# Patient Record
Sex: Female | Born: 1945
Health system: Southern US, Community
[De-identification: ages and names within clinical notes are randomized; demographics above are authoritative.]

## PROBLEM LIST (undated history)

## (undated) DIAGNOSIS — F0390 Unspecified dementia without behavioral disturbance: Secondary | ICD-10-CM

## (undated) DIAGNOSIS — M1990 Unspecified osteoarthritis, unspecified site: Secondary | ICD-10-CM

## (undated) DIAGNOSIS — I10 Essential (primary) hypertension: Secondary | ICD-10-CM

## (undated) DIAGNOSIS — Z9289 Personal history of other medical treatment: Secondary | ICD-10-CM

## (undated) DIAGNOSIS — D510 Vitamin B12 deficiency anemia due to intrinsic factor deficiency: Secondary | ICD-10-CM

## (undated) DIAGNOSIS — F329 Major depressive disorder, single episode, unspecified: Secondary | ICD-10-CM

## (undated) DIAGNOSIS — R32 Unspecified urinary incontinence: Secondary | ICD-10-CM

## (undated) DIAGNOSIS — E063 Autoimmune thyroiditis: Secondary | ICD-10-CM

## (undated) DIAGNOSIS — K9041 Non-celiac gluten sensitivity: Secondary | ICD-10-CM

## (undated) DIAGNOSIS — F319 Bipolar disorder, unspecified: Secondary | ICD-10-CM

## (undated) DIAGNOSIS — H548 Legal blindness, as defined in USA: Secondary | ICD-10-CM

## (undated) DIAGNOSIS — Z87440 Personal history of urinary (tract) infections: Secondary | ICD-10-CM

## (undated) DIAGNOSIS — E039 Hypothyroidism, unspecified: Secondary | ICD-10-CM

## (undated) DIAGNOSIS — E739 Lactose intolerance, unspecified: Secondary | ICD-10-CM

## (undated) DIAGNOSIS — F32A Depression, unspecified: Secondary | ICD-10-CM

## (undated) DIAGNOSIS — E079 Disorder of thyroid, unspecified: Secondary | ICD-10-CM

## (undated) DIAGNOSIS — B019 Varicella without complication: Secondary | ICD-10-CM

## (undated) DIAGNOSIS — Z96641 Presence of right artificial hip joint: Secondary | ICD-10-CM

## (undated) DIAGNOSIS — I00 Rheumatic fever without heart involvement: Secondary | ICD-10-CM

## (undated) DIAGNOSIS — T7840XA Allergy, unspecified, initial encounter: Secondary | ICD-10-CM

## (undated) DIAGNOSIS — R159 Full incontinence of feces: Secondary | ICD-10-CM

## (undated) DIAGNOSIS — M810 Age-related osteoporosis without current pathological fracture: Secondary | ICD-10-CM

## (undated) HISTORY — DX: Legal blindness, as defined in USA: H54.8

## (undated) HISTORY — DX: Major depressive disorder, single episode, unspecified: F32.9

## (undated) HISTORY — DX: Depression, unspecified: F32.A

## (undated) HISTORY — DX: Personal history of other medical treatment: Z92.89

## (undated) HISTORY — DX: Lactose intolerance, unspecified: E73.9

## (undated) HISTORY — DX: Hypothyroidism, unspecified: E03.9

## (undated) HISTORY — PX: OTHER SURGICAL HISTORY: SHX169

## (undated) HISTORY — DX: Allergy, unspecified, initial encounter: T78.40XA

## (undated) HISTORY — DX: Rheumatic fever without heart involvement: I00

## (undated) HISTORY — DX: Non-celiac gluten sensitivity: K90.41

## (undated) HISTORY — DX: Full incontinence of feces: R15.9

## (undated) HISTORY — DX: Bipolar disorder, unspecified: F31.9

## (undated) HISTORY — DX: Personal history of urinary (tract) infections: Z87.440

## (undated) HISTORY — DX: Unspecified osteoarthritis, unspecified site: M19.90

## (undated) HISTORY — DX: Essential (primary) hypertension: I10

## (undated) HISTORY — DX: Disorder of thyroid, unspecified: E07.9

## (undated) HISTORY — DX: Age-related osteoporosis without current pathological fracture: M81.0

## (undated) HISTORY — PX: TUBAL LIGATION: SHX77

## (undated) HISTORY — DX: Autoimmune thyroiditis: E06.3

## (undated) HISTORY — DX: Unspecified dementia, unspecified severity, without behavioral disturbance, psychotic disturbance, mood disturbance, and anxiety: F03.90

## (undated) HISTORY — DX: Presence of right artificial hip joint: Z96.641

## (undated) HISTORY — DX: Unspecified urinary incontinence: R32

## (undated) HISTORY — DX: Vitamin B12 deficiency anemia due to intrinsic factor deficiency: D51.0

## (undated) HISTORY — PX: ABDOMINAL HYSTERECTOMY: SHX81

## (undated) HISTORY — DX: Varicella without complication: B01.9

---

## 1964-03-20 HISTORY — PX: TONSILLECTOMY AND ADENOIDECTOMY: SUR1326

## 1979-03-21 HISTORY — PX: APPENDECTOMY: SHX54

## 1998-03-20 DIAGNOSIS — F419 Anxiety disorder, unspecified: Secondary | ICD-10-CM

## 1998-03-20 HISTORY — DX: Anxiety disorder, unspecified: F41.9

## 2012-03-20 HISTORY — PX: EYE SURGERY: SHX253

## 2013-03-20 DIAGNOSIS — Z9889 Other specified postprocedural states: Secondary | ICD-10-CM

## 2013-03-20 HISTORY — DX: Other specified postprocedural states: Z98.890

## 2016-03-24 DIAGNOSIS — M47812 Spondylosis without myelopathy or radiculopathy, cervical region: Secondary | ICD-10-CM | POA: Diagnosis not present

## 2016-03-24 DIAGNOSIS — M4802 Spinal stenosis, cervical region: Secondary | ICD-10-CM | POA: Diagnosis not present

## 2016-03-27 DIAGNOSIS — R29898 Other symptoms and signs involving the musculoskeletal system: Secondary | ICD-10-CM | POA: Diagnosis not present

## 2016-03-27 DIAGNOSIS — Z5189 Encounter for other specified aftercare: Secondary | ICD-10-CM | POA: Diagnosis not present

## 2016-03-27 DIAGNOSIS — G5603 Carpal tunnel syndrome, bilateral upper limbs: Secondary | ICD-10-CM | POA: Diagnosis not present

## 2016-04-05 DIAGNOSIS — G5603 Carpal tunnel syndrome, bilateral upper limbs: Secondary | ICD-10-CM | POA: Diagnosis not present

## 2016-04-05 DIAGNOSIS — Z5189 Encounter for other specified aftercare: Secondary | ICD-10-CM | POA: Diagnosis not present

## 2016-04-05 DIAGNOSIS — R29898 Other symptoms and signs involving the musculoskeletal system: Secondary | ICD-10-CM | POA: Diagnosis not present

## 2016-04-10 DIAGNOSIS — Z23 Encounter for immunization: Secondary | ICD-10-CM | POA: Diagnosis not present

## 2016-04-10 DIAGNOSIS — I10 Essential (primary) hypertension: Secondary | ICD-10-CM | POA: Diagnosis not present

## 2016-04-10 DIAGNOSIS — Z79899 Other long term (current) drug therapy: Secondary | ICD-10-CM | POA: Diagnosis not present

## 2016-04-10 DIAGNOSIS — E039 Hypothyroidism, unspecified: Secondary | ICD-10-CM | POA: Diagnosis not present

## 2016-04-10 DIAGNOSIS — D510 Vitamin B12 deficiency anemia due to intrinsic factor deficiency: Secondary | ICD-10-CM | POA: Diagnosis not present

## 2016-04-10 DIAGNOSIS — D509 Iron deficiency anemia, unspecified: Secondary | ICD-10-CM | POA: Diagnosis not present

## 2016-04-10 DIAGNOSIS — F3176 Bipolar disorder, in full remission, most recent episode depressed: Secondary | ICD-10-CM | POA: Diagnosis not present

## 2016-04-10 DIAGNOSIS — H3530 Unspecified macular degeneration: Secondary | ICD-10-CM | POA: Diagnosis not present

## 2016-04-14 DIAGNOSIS — R29898 Other symptoms and signs involving the musculoskeletal system: Secondary | ICD-10-CM | POA: Diagnosis not present

## 2016-04-14 DIAGNOSIS — G5603 Carpal tunnel syndrome, bilateral upper limbs: Secondary | ICD-10-CM | POA: Diagnosis not present

## 2016-04-14 DIAGNOSIS — Z5189 Encounter for other specified aftercare: Secondary | ICD-10-CM | POA: Diagnosis not present

## 2016-04-17 DIAGNOSIS — G5603 Carpal tunnel syndrome, bilateral upper limbs: Secondary | ICD-10-CM | POA: Diagnosis not present

## 2016-04-17 DIAGNOSIS — R29898 Other symptoms and signs involving the musculoskeletal system: Secondary | ICD-10-CM | POA: Diagnosis not present

## 2016-04-17 DIAGNOSIS — Z5189 Encounter for other specified aftercare: Secondary | ICD-10-CM | POA: Diagnosis not present

## 2016-04-28 DIAGNOSIS — J069 Acute upper respiratory infection, unspecified: Secondary | ICD-10-CM | POA: Diagnosis not present

## 2016-05-03 DIAGNOSIS — G609 Hereditary and idiopathic neuropathy, unspecified: Secondary | ICD-10-CM | POA: Diagnosis not present

## 2016-05-03 DIAGNOSIS — H353130 Nonexudative age-related macular degeneration, bilateral, stage unspecified: Secondary | ICD-10-CM | POA: Diagnosis not present

## 2016-05-04 DIAGNOSIS — Z0000 Encounter for general adult medical examination without abnormal findings: Secondary | ICD-10-CM | POA: Diagnosis not present

## 2016-05-04 DIAGNOSIS — E039 Hypothyroidism, unspecified: Secondary | ICD-10-CM | POA: Diagnosis not present

## 2016-05-04 DIAGNOSIS — I10 Essential (primary) hypertension: Secondary | ICD-10-CM | POA: Diagnosis not present

## 2016-05-04 DIAGNOSIS — M791 Myalgia: Secondary | ICD-10-CM | POA: Diagnosis not present

## 2016-05-04 DIAGNOSIS — Z79899 Other long term (current) drug therapy: Secondary | ICD-10-CM | POA: Diagnosis not present

## 2016-05-04 DIAGNOSIS — F3176 Bipolar disorder, in full remission, most recent episode depressed: Secondary | ICD-10-CM | POA: Diagnosis not present

## 2016-05-04 DIAGNOSIS — D509 Iron deficiency anemia, unspecified: Secondary | ICD-10-CM | POA: Diagnosis not present

## 2016-05-04 DIAGNOSIS — H3530 Unspecified macular degeneration: Secondary | ICD-10-CM | POA: Diagnosis not present

## 2016-05-04 DIAGNOSIS — D510 Vitamin B12 deficiency anemia due to intrinsic factor deficiency: Secondary | ICD-10-CM | POA: Diagnosis not present

## 2016-05-08 DIAGNOSIS — N390 Urinary tract infection, site not specified: Secondary | ICD-10-CM | POA: Diagnosis not present

## 2016-07-10 DIAGNOSIS — Z0000 Encounter for general adult medical examination without abnormal findings: Secondary | ICD-10-CM | POA: Diagnosis not present

## 2016-07-10 DIAGNOSIS — E039 Hypothyroidism, unspecified: Secondary | ICD-10-CM | POA: Diagnosis not present

## 2016-07-10 DIAGNOSIS — H3530 Unspecified macular degeneration: Secondary | ICD-10-CM | POA: Diagnosis not present

## 2016-07-10 DIAGNOSIS — F3176 Bipolar disorder, in full remission, most recent episode depressed: Secondary | ICD-10-CM | POA: Diagnosis not present

## 2016-07-10 DIAGNOSIS — D509 Iron deficiency anemia, unspecified: Secondary | ICD-10-CM | POA: Diagnosis not present

## 2016-07-10 DIAGNOSIS — Z79899 Other long term (current) drug therapy: Secondary | ICD-10-CM | POA: Diagnosis not present

## 2016-07-10 DIAGNOSIS — I10 Essential (primary) hypertension: Secondary | ICD-10-CM | POA: Diagnosis not present

## 2016-07-10 DIAGNOSIS — D510 Vitamin B12 deficiency anemia due to intrinsic factor deficiency: Secondary | ICD-10-CM | POA: Diagnosis not present

## 2016-07-31 DIAGNOSIS — R2681 Unsteadiness on feet: Secondary | ICD-10-CM | POA: Diagnosis not present

## 2016-07-31 DIAGNOSIS — Z8669 Personal history of other diseases of the nervous system and sense organs: Secondary | ICD-10-CM | POA: Diagnosis not present

## 2016-07-31 DIAGNOSIS — R2689 Other abnormalities of gait and mobility: Secondary | ICD-10-CM | POA: Diagnosis not present

## 2016-08-17 DIAGNOSIS — M71342 Other bursal cyst, left hand: Secondary | ICD-10-CM | POA: Diagnosis not present

## 2016-08-17 DIAGNOSIS — D1801 Hemangioma of skin and subcutaneous tissue: Secondary | ICD-10-CM | POA: Diagnosis not present

## 2016-10-30 DIAGNOSIS — Z0000 Encounter for general adult medical examination without abnormal findings: Secondary | ICD-10-CM | POA: Diagnosis not present

## 2016-10-30 DIAGNOSIS — D510 Vitamin B12 deficiency anemia due to intrinsic factor deficiency: Secondary | ICD-10-CM | POA: Diagnosis not present

## 2016-10-30 DIAGNOSIS — I10 Essential (primary) hypertension: Secondary | ICD-10-CM | POA: Diagnosis not present

## 2016-10-30 DIAGNOSIS — E039 Hypothyroidism, unspecified: Secondary | ICD-10-CM | POA: Diagnosis not present

## 2016-10-30 DIAGNOSIS — F3176 Bipolar disorder, in full remission, most recent episode depressed: Secondary | ICD-10-CM | POA: Diagnosis not present

## 2016-10-30 DIAGNOSIS — H3530 Unspecified macular degeneration: Secondary | ICD-10-CM | POA: Diagnosis not present

## 2016-10-30 DIAGNOSIS — Z79899 Other long term (current) drug therapy: Secondary | ICD-10-CM | POA: Diagnosis not present

## 2016-10-30 DIAGNOSIS — D509 Iron deficiency anemia, unspecified: Secondary | ICD-10-CM | POA: Diagnosis not present

## 2016-12-08 DIAGNOSIS — Z23 Encounter for immunization: Secondary | ICD-10-CM | POA: Diagnosis not present

## 2016-12-26 DIAGNOSIS — R269 Unspecified abnormalities of gait and mobility: Secondary | ICD-10-CM | POA: Diagnosis not present

## 2016-12-26 DIAGNOSIS — G609 Hereditary and idiopathic neuropathy, unspecified: Secondary | ICD-10-CM | POA: Diagnosis not present

## 2016-12-26 DIAGNOSIS — M4722 Other spondylosis with radiculopathy, cervical region: Secondary | ICD-10-CM | POA: Diagnosis not present

## 2016-12-27 DIAGNOSIS — G609 Hereditary and idiopathic neuropathy, unspecified: Secondary | ICD-10-CM | POA: Diagnosis not present

## 2016-12-27 DIAGNOSIS — R531 Weakness: Secondary | ICD-10-CM | POA: Diagnosis not present

## 2016-12-27 DIAGNOSIS — M4722 Other spondylosis with radiculopathy, cervical region: Secondary | ICD-10-CM | POA: Diagnosis not present

## 2016-12-27 DIAGNOSIS — R269 Unspecified abnormalities of gait and mobility: Secondary | ICD-10-CM | POA: Diagnosis not present

## 2016-12-29 DIAGNOSIS — M4722 Other spondylosis with radiculopathy, cervical region: Secondary | ICD-10-CM | POA: Diagnosis not present

## 2016-12-29 DIAGNOSIS — R269 Unspecified abnormalities of gait and mobility: Secondary | ICD-10-CM | POA: Diagnosis not present

## 2016-12-29 DIAGNOSIS — G609 Hereditary and idiopathic neuropathy, unspecified: Secondary | ICD-10-CM | POA: Diagnosis not present

## 2016-12-29 DIAGNOSIS — R531 Weakness: Secondary | ICD-10-CM | POA: Diagnosis not present

## 2017-01-01 DIAGNOSIS — G609 Hereditary and idiopathic neuropathy, unspecified: Secondary | ICD-10-CM | POA: Diagnosis not present

## 2017-01-01 DIAGNOSIS — M4722 Other spondylosis with radiculopathy, cervical region: Secondary | ICD-10-CM | POA: Diagnosis not present

## 2017-01-01 DIAGNOSIS — R269 Unspecified abnormalities of gait and mobility: Secondary | ICD-10-CM | POA: Diagnosis not present

## 2017-01-01 DIAGNOSIS — R531 Weakness: Secondary | ICD-10-CM | POA: Diagnosis not present

## 2017-01-02 DIAGNOSIS — Z1231 Encounter for screening mammogram for malignant neoplasm of breast: Secondary | ICD-10-CM | POA: Diagnosis not present

## 2017-01-03 DIAGNOSIS — M4722 Other spondylosis with radiculopathy, cervical region: Secondary | ICD-10-CM | POA: Diagnosis not present

## 2017-01-03 DIAGNOSIS — R531 Weakness: Secondary | ICD-10-CM | POA: Diagnosis not present

## 2017-01-03 DIAGNOSIS — G609 Hereditary and idiopathic neuropathy, unspecified: Secondary | ICD-10-CM | POA: Diagnosis not present

## 2017-01-03 DIAGNOSIS — R269 Unspecified abnormalities of gait and mobility: Secondary | ICD-10-CM | POA: Diagnosis not present

## 2017-01-05 DIAGNOSIS — R269 Unspecified abnormalities of gait and mobility: Secondary | ICD-10-CM | POA: Diagnosis not present

## 2017-01-05 DIAGNOSIS — R531 Weakness: Secondary | ICD-10-CM | POA: Diagnosis not present

## 2017-01-05 DIAGNOSIS — G609 Hereditary and idiopathic neuropathy, unspecified: Secondary | ICD-10-CM | POA: Diagnosis not present

## 2017-01-05 DIAGNOSIS — M4722 Other spondylosis with radiculopathy, cervical region: Secondary | ICD-10-CM | POA: Diagnosis not present

## 2017-01-08 DIAGNOSIS — M4722 Other spondylosis with radiculopathy, cervical region: Secondary | ICD-10-CM | POA: Diagnosis not present

## 2017-01-08 DIAGNOSIS — R531 Weakness: Secondary | ICD-10-CM | POA: Diagnosis not present

## 2017-01-08 DIAGNOSIS — G609 Hereditary and idiopathic neuropathy, unspecified: Secondary | ICD-10-CM | POA: Diagnosis not present

## 2017-01-08 DIAGNOSIS — R269 Unspecified abnormalities of gait and mobility: Secondary | ICD-10-CM | POA: Diagnosis not present

## 2017-01-10 DIAGNOSIS — R269 Unspecified abnormalities of gait and mobility: Secondary | ICD-10-CM | POA: Diagnosis not present

## 2017-01-10 DIAGNOSIS — R531 Weakness: Secondary | ICD-10-CM | POA: Diagnosis not present

## 2017-01-10 DIAGNOSIS — G609 Hereditary and idiopathic neuropathy, unspecified: Secondary | ICD-10-CM | POA: Diagnosis not present

## 2017-01-10 DIAGNOSIS — M4722 Other spondylosis with radiculopathy, cervical region: Secondary | ICD-10-CM | POA: Diagnosis not present

## 2017-01-12 DIAGNOSIS — G609 Hereditary and idiopathic neuropathy, unspecified: Secondary | ICD-10-CM | POA: Diagnosis not present

## 2017-01-12 DIAGNOSIS — M4722 Other spondylosis with radiculopathy, cervical region: Secondary | ICD-10-CM | POA: Diagnosis not present

## 2017-01-12 DIAGNOSIS — R531 Weakness: Secondary | ICD-10-CM | POA: Diagnosis not present

## 2017-01-12 DIAGNOSIS — R269 Unspecified abnormalities of gait and mobility: Secondary | ICD-10-CM | POA: Diagnosis not present

## 2017-01-26 DIAGNOSIS — H04123 Dry eye syndrome of bilateral lacrimal glands: Secondary | ICD-10-CM | POA: Diagnosis not present

## 2017-01-26 DIAGNOSIS — H353132 Nonexudative age-related macular degeneration, bilateral, intermediate dry stage: Secondary | ICD-10-CM | POA: Diagnosis not present

## 2017-01-26 DIAGNOSIS — H02833 Dermatochalasis of right eye, unspecified eyelid: Secondary | ICD-10-CM | POA: Diagnosis not present

## 2017-01-26 DIAGNOSIS — H43813 Vitreous degeneration, bilateral: Secondary | ICD-10-CM | POA: Diagnosis not present

## 2017-01-26 DIAGNOSIS — H35372 Puckering of macula, left eye: Secondary | ICD-10-CM | POA: Diagnosis not present

## 2017-01-26 DIAGNOSIS — H02836 Dermatochalasis of left eye, unspecified eyelid: Secondary | ICD-10-CM | POA: Diagnosis not present

## 2017-01-26 DIAGNOSIS — H35371 Puckering of macula, right eye: Secondary | ICD-10-CM | POA: Diagnosis not present

## 2017-01-29 DIAGNOSIS — R531 Weakness: Secondary | ICD-10-CM | POA: Diagnosis not present

## 2017-01-29 DIAGNOSIS — G609 Hereditary and idiopathic neuropathy, unspecified: Secondary | ICD-10-CM | POA: Diagnosis not present

## 2017-01-29 DIAGNOSIS — M4722 Other spondylosis with radiculopathy, cervical region: Secondary | ICD-10-CM | POA: Diagnosis not present

## 2017-01-29 DIAGNOSIS — R269 Unspecified abnormalities of gait and mobility: Secondary | ICD-10-CM | POA: Diagnosis not present

## 2017-01-30 DIAGNOSIS — H527 Unspecified disorder of refraction: Secondary | ICD-10-CM | POA: Diagnosis not present

## 2017-01-30 DIAGNOSIS — H35372 Puckering of macula, left eye: Secondary | ICD-10-CM | POA: Diagnosis not present

## 2017-01-30 DIAGNOSIS — H353122 Nonexudative age-related macular degeneration, left eye, intermediate dry stage: Secondary | ICD-10-CM | POA: Diagnosis not present

## 2017-01-30 DIAGNOSIS — H353112 Nonexudative age-related macular degeneration, right eye, intermediate dry stage: Secondary | ICD-10-CM | POA: Diagnosis not present

## 2017-01-30 DIAGNOSIS — H40013 Open angle with borderline findings, low risk, bilateral: Secondary | ICD-10-CM | POA: Diagnosis not present

## 2017-01-31 DIAGNOSIS — G609 Hereditary and idiopathic neuropathy, unspecified: Secondary | ICD-10-CM | POA: Diagnosis not present

## 2017-01-31 DIAGNOSIS — M4722 Other spondylosis with radiculopathy, cervical region: Secondary | ICD-10-CM | POA: Diagnosis not present

## 2017-01-31 DIAGNOSIS — R531 Weakness: Secondary | ICD-10-CM | POA: Diagnosis not present

## 2017-01-31 DIAGNOSIS — R269 Unspecified abnormalities of gait and mobility: Secondary | ICD-10-CM | POA: Diagnosis not present

## 2017-02-02 DIAGNOSIS — R269 Unspecified abnormalities of gait and mobility: Secondary | ICD-10-CM | POA: Diagnosis not present

## 2017-02-02 DIAGNOSIS — G609 Hereditary and idiopathic neuropathy, unspecified: Secondary | ICD-10-CM | POA: Diagnosis not present

## 2017-02-02 DIAGNOSIS — R531 Weakness: Secondary | ICD-10-CM | POA: Diagnosis not present

## 2017-02-02 DIAGNOSIS — M4722 Other spondylosis with radiculopathy, cervical region: Secondary | ICD-10-CM | POA: Diagnosis not present

## 2017-02-05 DIAGNOSIS — I10 Essential (primary) hypertension: Secondary | ICD-10-CM | POA: Diagnosis not present

## 2017-02-05 DIAGNOSIS — D510 Vitamin B12 deficiency anemia due to intrinsic factor deficiency: Secondary | ICD-10-CM | POA: Diagnosis not present

## 2017-02-05 DIAGNOSIS — H3530 Unspecified macular degeneration: Secondary | ICD-10-CM | POA: Diagnosis not present

## 2017-02-05 DIAGNOSIS — F3176 Bipolar disorder, in full remission, most recent episode depressed: Secondary | ICD-10-CM | POA: Diagnosis not present

## 2017-02-05 DIAGNOSIS — Z0000 Encounter for general adult medical examination without abnormal findings: Secondary | ICD-10-CM | POA: Diagnosis not present

## 2017-02-05 DIAGNOSIS — D509 Iron deficiency anemia, unspecified: Secondary | ICD-10-CM | POA: Diagnosis not present

## 2017-02-05 DIAGNOSIS — E039 Hypothyroidism, unspecified: Secondary | ICD-10-CM | POA: Diagnosis not present

## 2017-02-05 DIAGNOSIS — Z79899 Other long term (current) drug therapy: Secondary | ICD-10-CM | POA: Diagnosis not present

## 2017-02-15 DIAGNOSIS — R531 Weakness: Secondary | ICD-10-CM | POA: Diagnosis not present

## 2017-02-15 DIAGNOSIS — M4722 Other spondylosis with radiculopathy, cervical region: Secondary | ICD-10-CM | POA: Diagnosis not present

## 2017-02-15 DIAGNOSIS — G609 Hereditary and idiopathic neuropathy, unspecified: Secondary | ICD-10-CM | POA: Diagnosis not present

## 2017-02-15 DIAGNOSIS — R269 Unspecified abnormalities of gait and mobility: Secondary | ICD-10-CM | POA: Diagnosis not present

## 2017-02-19 DIAGNOSIS — M4722 Other spondylosis with radiculopathy, cervical region: Secondary | ICD-10-CM | POA: Diagnosis not present

## 2017-02-19 DIAGNOSIS — R269 Unspecified abnormalities of gait and mobility: Secondary | ICD-10-CM | POA: Diagnosis not present

## 2017-02-19 DIAGNOSIS — G609 Hereditary and idiopathic neuropathy, unspecified: Secondary | ICD-10-CM | POA: Diagnosis not present

## 2017-02-19 DIAGNOSIS — R531 Weakness: Secondary | ICD-10-CM | POA: Diagnosis not present

## 2017-02-20 DIAGNOSIS — G609 Hereditary and idiopathic neuropathy, unspecified: Secondary | ICD-10-CM | POA: Diagnosis not present

## 2017-02-20 DIAGNOSIS — R531 Weakness: Secondary | ICD-10-CM | POA: Diagnosis not present

## 2017-02-20 DIAGNOSIS — R269 Unspecified abnormalities of gait and mobility: Secondary | ICD-10-CM | POA: Diagnosis not present

## 2017-02-20 DIAGNOSIS — M4722 Other spondylosis with radiculopathy, cervical region: Secondary | ICD-10-CM | POA: Diagnosis not present

## 2017-03-15 DIAGNOSIS — X32XXXA Exposure to sunlight, initial encounter: Secondary | ICD-10-CM | POA: Diagnosis not present

## 2017-03-15 DIAGNOSIS — L814 Other melanin hyperpigmentation: Secondary | ICD-10-CM | POA: Diagnosis not present

## 2017-03-15 DIAGNOSIS — D485 Neoplasm of uncertain behavior of skin: Secondary | ICD-10-CM | POA: Diagnosis not present

## 2017-03-15 DIAGNOSIS — D1801 Hemangioma of skin and subcutaneous tissue: Secondary | ICD-10-CM | POA: Diagnosis not present

## 2017-03-15 DIAGNOSIS — M71342 Other bursal cyst, left hand: Secondary | ICD-10-CM | POA: Diagnosis not present

## 2017-03-15 DIAGNOSIS — L720 Epidermal cyst: Secondary | ICD-10-CM | POA: Diagnosis not present

## 2017-03-15 DIAGNOSIS — L570 Actinic keratosis: Secondary | ICD-10-CM | POA: Diagnosis not present

## 2017-03-15 DIAGNOSIS — L218 Other seborrheic dermatitis: Secondary | ICD-10-CM | POA: Diagnosis not present

## 2017-03-15 DIAGNOSIS — L821 Other seborrheic keratosis: Secondary | ICD-10-CM | POA: Diagnosis not present

## 2017-03-20 DIAGNOSIS — Z9289 Personal history of other medical treatment: Secondary | ICD-10-CM

## 2017-03-20 HISTORY — DX: Personal history of other medical treatment: Z92.89

## 2017-03-22 DIAGNOSIS — L720 Epidermal cyst: Secondary | ICD-10-CM | POA: Diagnosis not present

## 2017-03-28 DIAGNOSIS — H40013 Open angle with borderline findings, low risk, bilateral: Secondary | ICD-10-CM | POA: Diagnosis not present

## 2017-04-29 DIAGNOSIS — R6883 Chills (without fever): Secondary | ICD-10-CM | POA: Diagnosis not present

## 2017-04-29 DIAGNOSIS — J209 Acute bronchitis, unspecified: Secondary | ICD-10-CM | POA: Diagnosis not present

## 2017-04-29 DIAGNOSIS — J069 Acute upper respiratory infection, unspecified: Secondary | ICD-10-CM | POA: Diagnosis not present

## 2017-04-29 DIAGNOSIS — R05 Cough: Secondary | ICD-10-CM | POA: Diagnosis not present

## 2017-04-29 DIAGNOSIS — R0602 Shortness of breath: Secondary | ICD-10-CM | POA: Diagnosis not present

## 2017-05-09 DIAGNOSIS — Z79899 Other long term (current) drug therapy: Secondary | ICD-10-CM | POA: Diagnosis not present

## 2017-05-09 DIAGNOSIS — I10 Essential (primary) hypertension: Secondary | ICD-10-CM | POA: Diagnosis not present

## 2017-05-09 DIAGNOSIS — F3176 Bipolar disorder, in full remission, most recent episode depressed: Secondary | ICD-10-CM | POA: Diagnosis not present

## 2017-05-09 DIAGNOSIS — E039 Hypothyroidism, unspecified: Secondary | ICD-10-CM | POA: Diagnosis not present

## 2017-05-09 DIAGNOSIS — Z0000 Encounter for general adult medical examination without abnormal findings: Secondary | ICD-10-CM | POA: Diagnosis not present

## 2017-05-09 DIAGNOSIS — D509 Iron deficiency anemia, unspecified: Secondary | ICD-10-CM | POA: Diagnosis not present

## 2017-05-09 DIAGNOSIS — D510 Vitamin B12 deficiency anemia due to intrinsic factor deficiency: Secondary | ICD-10-CM | POA: Diagnosis not present

## 2017-05-09 DIAGNOSIS — H3530 Unspecified macular degeneration: Secondary | ICD-10-CM | POA: Diagnosis not present

## 2017-05-23 DIAGNOSIS — E039 Hypothyroidism, unspecified: Secondary | ICD-10-CM | POA: Diagnosis not present

## 2017-08-07 DIAGNOSIS — Z79899 Other long term (current) drug therapy: Secondary | ICD-10-CM | POA: Diagnosis not present

## 2017-08-07 DIAGNOSIS — I10 Essential (primary) hypertension: Secondary | ICD-10-CM | POA: Diagnosis not present

## 2017-08-07 DIAGNOSIS — E039 Hypothyroidism, unspecified: Secondary | ICD-10-CM | POA: Diagnosis not present

## 2017-08-07 DIAGNOSIS — D510 Vitamin B12 deficiency anemia due to intrinsic factor deficiency: Secondary | ICD-10-CM | POA: Diagnosis not present

## 2017-08-07 DIAGNOSIS — F3176 Bipolar disorder, in full remission, most recent episode depressed: Secondary | ICD-10-CM | POA: Diagnosis not present

## 2017-08-24 DIAGNOSIS — H353130 Nonexudative age-related macular degeneration, bilateral, stage unspecified: Secondary | ICD-10-CM | POA: Diagnosis not present

## 2017-11-07 DIAGNOSIS — D510 Vitamin B12 deficiency anemia due to intrinsic factor deficiency: Secondary | ICD-10-CM | POA: Diagnosis not present

## 2017-11-07 DIAGNOSIS — E039 Hypothyroidism, unspecified: Secondary | ICD-10-CM | POA: Diagnosis not present

## 2017-11-07 DIAGNOSIS — F3176 Bipolar disorder, in full remission, most recent episode depressed: Secondary | ICD-10-CM | POA: Diagnosis not present

## 2017-11-07 DIAGNOSIS — I10 Essential (primary) hypertension: Secondary | ICD-10-CM | POA: Diagnosis not present

## 2017-11-15 DIAGNOSIS — R5081 Fever presenting with conditions classified elsewhere: Secondary | ICD-10-CM | POA: Diagnosis not present

## 2017-11-15 DIAGNOSIS — J069 Acute upper respiratory infection, unspecified: Secondary | ICD-10-CM | POA: Diagnosis not present

## 2017-11-15 DIAGNOSIS — R0602 Shortness of breath: Secondary | ICD-10-CM | POA: Diagnosis not present

## 2017-11-15 DIAGNOSIS — J0100 Acute maxillary sinusitis, unspecified: Secondary | ICD-10-CM | POA: Diagnosis not present

## 2017-12-07 DIAGNOSIS — Z885 Allergy status to narcotic agent status: Secondary | ICD-10-CM | POA: Diagnosis not present

## 2017-12-07 DIAGNOSIS — R930 Abnormal findings on diagnostic imaging of skull and head, not elsewhere classified: Secondary | ICD-10-CM | POA: Diagnosis not present

## 2017-12-07 DIAGNOSIS — Z880 Allergy status to penicillin: Secondary | ICD-10-CM | POA: Diagnosis not present

## 2017-12-07 DIAGNOSIS — I6601 Occlusion and stenosis of right middle cerebral artery: Secondary | ICD-10-CM | POA: Diagnosis not present

## 2017-12-07 DIAGNOSIS — I6523 Occlusion and stenosis of bilateral carotid arteries: Secondary | ICD-10-CM | POA: Diagnosis not present

## 2017-12-07 DIAGNOSIS — I10 Essential (primary) hypertension: Secondary | ICD-10-CM | POA: Diagnosis not present

## 2017-12-07 DIAGNOSIS — H538 Other visual disturbances: Secondary | ICD-10-CM | POA: Diagnosis not present

## 2017-12-07 DIAGNOSIS — I671 Cerebral aneurysm, nonruptured: Secondary | ICD-10-CM | POA: Diagnosis not present

## 2017-12-07 DIAGNOSIS — H539 Unspecified visual disturbance: Secondary | ICD-10-CM | POA: Diagnosis not present

## 2017-12-07 DIAGNOSIS — E063 Autoimmune thyroiditis: Secondary | ICD-10-CM | POA: Diagnosis not present

## 2017-12-07 DIAGNOSIS — Z9889 Other specified postprocedural states: Secondary | ICD-10-CM | POA: Diagnosis not present

## 2017-12-08 DIAGNOSIS — H538 Other visual disturbances: Secondary | ICD-10-CM | POA: Diagnosis not present

## 2017-12-08 DIAGNOSIS — G459 Transient cerebral ischemic attack, unspecified: Secondary | ICD-10-CM | POA: Diagnosis not present

## 2017-12-08 DIAGNOSIS — D510 Vitamin B12 deficiency anemia due to intrinsic factor deficiency: Secondary | ICD-10-CM | POA: Diagnosis not present

## 2017-12-08 DIAGNOSIS — I6601 Occlusion and stenosis of right middle cerebral artery: Secondary | ICD-10-CM | POA: Diagnosis not present

## 2017-12-08 DIAGNOSIS — Z880 Allergy status to penicillin: Secondary | ICD-10-CM | POA: Diagnosis not present

## 2017-12-08 DIAGNOSIS — H539 Unspecified visual disturbance: Secondary | ICD-10-CM | POA: Diagnosis not present

## 2017-12-08 DIAGNOSIS — I671 Cerebral aneurysm, nonruptured: Secondary | ICD-10-CM | POA: Diagnosis not present

## 2017-12-08 DIAGNOSIS — Z79899 Other long term (current) drug therapy: Secondary | ICD-10-CM | POA: Diagnosis not present

## 2017-12-08 DIAGNOSIS — I6523 Occlusion and stenosis of bilateral carotid arteries: Secondary | ICD-10-CM | POA: Diagnosis not present

## 2017-12-08 DIAGNOSIS — Z885 Allergy status to narcotic agent status: Secondary | ICD-10-CM | POA: Diagnosis not present

## 2017-12-08 DIAGNOSIS — I10 Essential (primary) hypertension: Secondary | ICD-10-CM | POA: Diagnosis not present

## 2017-12-08 DIAGNOSIS — Z743 Need for continuous supervision: Secondary | ICD-10-CM | POA: Diagnosis not present

## 2017-12-08 DIAGNOSIS — E063 Autoimmune thyroiditis: Secondary | ICD-10-CM | POA: Diagnosis not present

## 2017-12-11 DIAGNOSIS — H35372 Puckering of macula, left eye: Secondary | ICD-10-CM | POA: Diagnosis not present

## 2017-12-11 DIAGNOSIS — H43813 Vitreous degeneration, bilateral: Secondary | ICD-10-CM | POA: Diagnosis not present

## 2017-12-11 DIAGNOSIS — H35371 Puckering of macula, right eye: Secondary | ICD-10-CM | POA: Diagnosis not present

## 2017-12-11 DIAGNOSIS — H353132 Nonexudative age-related macular degeneration, bilateral, intermediate dry stage: Secondary | ICD-10-CM | POA: Diagnosis not present

## 2017-12-11 DIAGNOSIS — H53122 Transient visual loss, left eye: Secondary | ICD-10-CM | POA: Diagnosis not present

## 2017-12-13 DIAGNOSIS — H353132 Nonexudative age-related macular degeneration, bilateral, intermediate dry stage: Secondary | ICD-10-CM | POA: Diagnosis not present

## 2017-12-18 DIAGNOSIS — G609 Hereditary and idiopathic neuropathy, unspecified: Secondary | ICD-10-CM | POA: Diagnosis not present

## 2018-01-02 DIAGNOSIS — R9089 Other abnormal findings on diagnostic imaging of central nervous system: Secondary | ICD-10-CM | POA: Diagnosis not present

## 2018-01-02 DIAGNOSIS — M4802 Spinal stenosis, cervical region: Secondary | ICD-10-CM | POA: Diagnosis not present

## 2018-01-18 DIAGNOSIS — M4313 Spondylolisthesis, cervicothoracic region: Secondary | ICD-10-CM | POA: Diagnosis not present

## 2018-01-18 DIAGNOSIS — M4802 Spinal stenosis, cervical region: Secondary | ICD-10-CM | POA: Diagnosis not present

## 2018-01-18 DIAGNOSIS — M47812 Spondylosis without myelopathy or radiculopathy, cervical region: Secondary | ICD-10-CM | POA: Diagnosis not present

## 2018-01-18 DIAGNOSIS — I671 Cerebral aneurysm, nonruptured: Secondary | ICD-10-CM | POA: Diagnosis not present

## 2018-01-18 DIAGNOSIS — M4312 Spondylolisthesis, cervical region: Secondary | ICD-10-CM | POA: Diagnosis not present

## 2018-01-19 DIAGNOSIS — Z23 Encounter for immunization: Secondary | ICD-10-CM | POA: Diagnosis not present

## 2018-03-01 ENCOUNTER — Ambulatory Visit (INDEPENDENT_AMBULATORY_CARE_PROVIDER_SITE_OTHER): Payer: Medicare Other | Admitting: Internal Medicine

## 2018-03-01 ENCOUNTER — Encounter: Payer: Self-pay | Admitting: Internal Medicine

## 2018-03-01 VITALS — BP 110/70 | HR 100 | Temp 98.4°F | Ht 65.0 in | Wt 134.0 lb

## 2018-03-01 DIAGNOSIS — M810 Age-related osteoporosis without current pathological fracture: Secondary | ICD-10-CM | POA: Diagnosis not present

## 2018-03-01 DIAGNOSIS — D510 Vitamin B12 deficiency anemia due to intrinsic factor deficiency: Secondary | ICD-10-CM

## 2018-03-01 DIAGNOSIS — F319 Bipolar disorder, unspecified: Secondary | ICD-10-CM

## 2018-03-01 DIAGNOSIS — E039 Hypothyroidism, unspecified: Secondary | ICD-10-CM

## 2018-03-01 DIAGNOSIS — I10 Essential (primary) hypertension: Secondary | ICD-10-CM | POA: Diagnosis not present

## 2018-03-01 DIAGNOSIS — H3530 Unspecified macular degeneration: Secondary | ICD-10-CM | POA: Insufficient documentation

## 2018-03-01 MED ORDER — AMLODIPINE BESYLATE 10 MG PO TABS: 10.0000 mg | ORAL_TABLET | Freq: Every day | ORAL | 1 refills | Status: DC

## 2018-03-01 MED ORDER — OLMESARTAN MEDOXOMIL 20 MG PO TABS: 20.0000 mg | ORAL_TABLET | Freq: Every day | ORAL | 1 refills | Status: DC

## 2018-03-01 MED ORDER — VENLAFAXINE HCL ER 225 MG PO TB24: 225.0000 mg | ORAL_TABLET | Freq: Every day | ORAL | 0 refills | Status: DC

## 2018-03-01 MED ORDER — CYANOCOBALAMIN 1000 MCG/ML IJ SOLN: 1000.0000 ug | INTRAMUSCULAR | 0 refills | Status: DC

## 2018-03-01 MED ORDER — DIVALPROEX SODIUM ER 500 MG PO TB24: 500.0000 mg | ORAL_TABLET | Freq: Every day | ORAL | 0 refills | Status: DC

## 2018-03-01 MED ORDER — VORTIOXETINE HBR 20 MG PO TABS: 20.0000 mg | ORAL_TABLET | Freq: Every day | ORAL | 0 refills | Status: DC

## 2018-03-01 MED ORDER — ALENDRONATE SODIUM 70 MG PO TABS: 70.0000 mg | ORAL_TABLET | ORAL | 0 refills | Status: DC

## 2018-03-01 MED ORDER — LEVOTHYROXINE SODIUM 100 MCG PO TABS: 100.0000 ug | ORAL_TABLET | Freq: Every day | ORAL | 1 refills | Status: DC

## 2018-03-01 NOTE — Patient Instructions (Signed)
-  It was nice meeting you today!  -We have sent referrals to psychiatry and ophthalmology at your request.  -Please schedule follow up in 6 months.

## 2018-03-01 NOTE — Progress Notes (Signed)
New Patient Office Visit     CC/Reason for Visit: Establish care, follow-up on chronic medical conditions Previous PCP: Stefanie Libel, MD in [place] Visit: August 2019  HPI: Jackie Jones is a 72 y.o. female who is coming in today for the above mentioned reasons.  She is not due for annual Medicare wellness visit until August 2020.  Past Medical History is significant for: Bipolar disorder, depressed type, osteoporosis, hypertension, hypothyroidism, pernicious anemia, macular degeneration.  Her mood is depressed, she states she had to leave [place] semi-urgently when a friend who was taking care of her no longer was willing to assume responsibility for her.  She came to [facility] to be close to her sister and niece who live locally.  She has no acute complaints other than she is running out of her medications and needs refills.  Is requesting referral to psychiatry.  She brings in recent blood work from May 2019 as well as her list of medications.   Past Medical/Surgical History: Past Medical History:  Diagnosis Date  . Allergy   . Arthritis   . Bipolar 1 disorder, depressed ([place])   . Chicken pox   . Depression   . History of blood transfusion   . Hx: UTI (urinary tract infection)   . Hypertension   . Hypothyroidism   . Osteoporosis   . Pernicious anemia   . Rheumatic fever   . Thyroid disease     Past Surgical History:  Procedure Laterality Date  . ABDOMINAL HYSTERECTOMY     1981  . APPENDECTOMY  1981  . prolapsed rectum 2015    . TONSILLECTOMY AND ADENOIDECTOMY  1966    Social History:  reports that she has never smoked. She has never used smokeless tobacco. She reports that she does not drink alcohol. No history on file for drug.  Allergies: Allergies  Allergen Reactions  . Ciprofloxacin Rash  . Codeine Rash  . Levaquin [Levofloxacin] Rash  . Penicillins Rash    Family History:  Family History  Problem Relation Age of Onset  . Heart  disease Mother   . Arthritis Mother   . Cancer Father        bladder  . Thyroid disease Father   . Dementia Father   . Hypertension Sister   . Hypertension Brother   . Hypertension Sister   . Hypertension Sister   . Diabetes Brother      Current Outpatient Medications:  .  acetaminophen (TYLENOL) 500 MG tablet, Take 500 mg by mouth every 6 (six) hours as needed., Disp: , Rfl:  .  alendronate (FOSAMAX) 70 MG tablet, Take 1 tablet (70 mg total) by mouth once a week. Take with a full glass of water on an empty stomach., Disp: 12 tablet, Rfl: 0 .  amLODipine (NORVASC) 10 MG tablet, Take 1 tablet (10 mg total) by mouth daily., Disp: 90 tablet, Rfl: 1 .  cyanocobalamin (,VITAMIN B-12,) 1000 MCG/ML injection, Inject 1 mL (1,000 mcg total) into the muscle every 30 (thirty) days., Disp: 10 mL, Rfl: 0 .  divalproex (DEPAKOTE ER) 500 MG 24 hr tablet, Take 1 tablet (500 mg total) by mouth at bedtime., Disp: 30 tablet, Rfl: 0 .  levothyroxine (SYNTHROID, LEVOTHROID) 100 MCG tablet, Take 1 tablet (100 mcg total) by mouth daily before breakfast., Disp: 90 tablet, Rfl: 1 .  Multiple Vitamins-Minerals (PRESERVISION AREDS) CAPS, Take by mouth., Disp: , Rfl:  .  olmesartan (BENICAR) 20 MG tablet, Take 1 tablet (  20 mg total) by mouth daily., Disp: 90 tablet, Rfl: 1 .  OVER THE COUNTER MEDICATION, AllerClear 10 mg, Disp: , Rfl:  .  Polyethyl Glycol-Propyl Glycol (SYSTANE) 0.4-0.3 % SOLN, Apply to eye., Disp: , Rfl:  .  Venlafaxine HCl 225 MG TB24, Take 1 tablet (225 mg total) by mouth at bedtime., Disp: 30 tablet, Rfl: 0 .  vortioxetine HBr (TRINTELLIX) 20 MG TABS tablet, Take 1 tablet (20 mg total) by mouth daily., Disp: 30 tablet, Rfl: 0  Review of Systems:  Constitutional: Denies fever, chills, diaphoresis, appetite change and fatigue.  HEENT: Denies photophobia, eye pain, redness, hearing loss, ear pain, congestion, sore throat, rhinorrhea, sneezing, mouth sores, trouble swallowing, neck pain, neck  stiffness and tinnitus.   Respiratory: Denies SOB, DOE, cough, chest tightness,  and wheezing.   Cardiovascular: Denies chest pain, palpitations and leg swelling.  Gastrointestinal: Denies nausea, vomiting, abdominal pain, diarrhea, constipation, blood in stool and abdominal distention.  Genitourinary: Denies dysuria, urgency, frequency, hematuria, flank pain and difficulty urinating.  Endocrine: Denies: hot or cold intolerance, sweats, changes in hair or nails, polyuria, polydipsia. Musculoskeletal: Denies myalgias, back pain, joint swelling, arthralgias and gait problem.  Skin: Denies pallor, rash and wound.  Neurological: Denies dizziness, seizures, syncope, weakness, light-headedness, numbness and headaches.  Hematological: Denies adenopathy. Easy bruising, personal or family bleeding history  Psychiatric/Behavioral: Denies suicidal ideation, but admits to depressed mood, nervousness, sleep disturbance and agitation.      Physical Exam: Vitals:   03/01/18 1115  BP: 110/70  Pulse: 100  Temp: 98.4 F (36.9 C)  TempSrc: Oral  SpO2: 97%  Weight: 134 lb (60.8 kg)  Height: 5\' 5"  (1.651 m)   Body mass index is 22.3 kg/m.  Constitutional: NAD, calm, comfortable Eyes: PERRL, lids and conjunctivae normal, wears corrective lenses ENMT: Mucous membranes are moist.  Neck: normal, supple, no masses, no thyromegaly Respiratory: clear to auscultation bilaterally, no wheezing, no crackles. Normal respiratory effort. No accessory muscle use.  Cardiovascular: Regular rate and rhythm, no murmurs / rubs / gallops. No extremity edema. 2+ pedal pulses. No carotid bruits.  Musculoskeletal: no clubbing / cyanosis. No joint deformity upper and lower extremities. Good ROM, no contractures. Normal muscle tone.  Skin: no rashes, lesions, ulcers. No induration Neurologic: Grossly intact and nonfocal Psychiatric: Normal judgment and insight. Alert and oriented x 3.  Mood is depressed   Impression and  Plan:  Essential hypertension -Blood pressures well controlled, will refill medications including amlodipine 10 mg daily, olmesartan 20 mg daily.  Bipolar 1 disorder, depressed ([place])  -Have agreed to refill her psychotropic medications today while she establishes care with psychiatry locally. -She is on divalproex 500 mg at bedtime, venlafaxine 225 mg daily, Brintellix 20 mg daily. -Urgent referral to neurology has been placed.  Hypothyroidism, unspecified type -We will refill her Synthroid, she brings in blood work from [place] done in May 2019 with a normal TSH of 2.86.  Pernicious anemia -She does monthly B12 injections, will refill today.  Age-related osteoporosis without current pathological fracture  -Is on Fosamax daily, will refill.  Macular degeneration, unspecified laterality, unspecified type - Plan: Ambulatory referral to Ophthalmology -Per patient she had eye exam in [place] earlier this year although I do not have the report.     Patient Instructions  -It was nice meeting you today!  -We have sent referrals to psychiatry and ophthalmology at your request.  -Please schedule follow up in 6 months.     [name], MD [place]  [facility]

## 2018-03-03 ENCOUNTER — Other Ambulatory Visit: Payer: Self-pay | Admitting: Internal Medicine

## 2018-03-03 DIAGNOSIS — F319 Bipolar disorder, unspecified: Secondary | ICD-10-CM

## 2018-03-05 NOTE — Telephone Encounter (Signed)
This was refilled on 12/13. She was asked to follow with psych for further treatment.

## 2018-03-22 ENCOUNTER — Telehealth: Payer: Self-pay | Admitting: Internal Medicine

## 2018-03-22 NOTE — Telephone Encounter (Signed)
Copied from [place] [id number]. Topic: Quick Communication - See Telephone Encounter >> Mar 22, 2018  9:12 AM Blase Mess A wrote: CRM for notification. See Telephone encounter for: 03/22/18.  Patient is requesting a call back.  She has not heard anything on the referral from psychiatry    The patient is also having an eating disorder. She has lost 10lb. Requesting a referral for some assistance on where to go. Please advise [phone]

## 2018-03-22 NOTE — Telephone Encounter (Signed)
Called the patient and gave her the number for [facility].

## 2018-04-01 ENCOUNTER — Ambulatory Visit (INDEPENDENT_AMBULATORY_CARE_PROVIDER_SITE_OTHER): Payer: Medicare Other | Admitting: Psychology

## 2018-04-01 DIAGNOSIS — F332 Major depressive disorder, recurrent severe without psychotic features: Secondary | ICD-10-CM

## 2018-04-11 ENCOUNTER — Ambulatory Visit (INDEPENDENT_AMBULATORY_CARE_PROVIDER_SITE_OTHER): Payer: Medicare Other | Admitting: Psychology

## 2018-04-11 DIAGNOSIS — F332 Major depressive disorder, recurrent severe without psychotic features: Secondary | ICD-10-CM

## 2018-05-03 ENCOUNTER — Ambulatory Visit (INDEPENDENT_AMBULATORY_CARE_PROVIDER_SITE_OTHER): Payer: Medicare Other | Admitting: Psychology

## 2018-05-03 DIAGNOSIS — F332 Major depressive disorder, recurrent severe without psychotic features: Secondary | ICD-10-CM | POA: Diagnosis not present

## 2018-05-17 ENCOUNTER — Ambulatory Visit (INDEPENDENT_AMBULATORY_CARE_PROVIDER_SITE_OTHER): Payer: Medicare Other | Admitting: Psychology

## 2018-05-17 DIAGNOSIS — F332 Major depressive disorder, recurrent severe without psychotic features: Secondary | ICD-10-CM

## 2018-05-20 ENCOUNTER — Ambulatory Visit (INDEPENDENT_AMBULATORY_CARE_PROVIDER_SITE_OTHER): Payer: Medicare Other | Admitting: Psychology

## 2018-05-20 DIAGNOSIS — F332 Major depressive disorder, recurrent severe without psychotic features: Secondary | ICD-10-CM | POA: Diagnosis not present

## 2018-05-31 ENCOUNTER — Ambulatory Visit (INDEPENDENT_AMBULATORY_CARE_PROVIDER_SITE_OTHER): Payer: Medicare Other | Admitting: Psychology

## 2018-05-31 DIAGNOSIS — F332 Major depressive disorder, recurrent severe without psychotic features: Secondary | ICD-10-CM | POA: Diagnosis not present

## 2018-06-07 ENCOUNTER — Ambulatory Visit: Payer: Medicare Other | Admitting: Psychology

## 2018-06-14 ENCOUNTER — Ambulatory Visit (INDEPENDENT_AMBULATORY_CARE_PROVIDER_SITE_OTHER): Payer: Medicare Other | Admitting: Psychology

## 2018-06-14 DIAGNOSIS — F332 Major depressive disorder, recurrent severe without psychotic features: Secondary | ICD-10-CM | POA: Diagnosis not present

## 2018-06-17 ENCOUNTER — Other Ambulatory Visit: Payer: Self-pay

## 2018-06-21 ENCOUNTER — Ambulatory Visit (INDEPENDENT_AMBULATORY_CARE_PROVIDER_SITE_OTHER): Payer: Medicare Other | Admitting: Psychology

## 2018-06-21 DIAGNOSIS — F332 Major depressive disorder, recurrent severe without psychotic features: Secondary | ICD-10-CM | POA: Diagnosis not present

## 2018-06-28 ENCOUNTER — Ambulatory Visit: Payer: Medicare Other | Admitting: Psychology

## 2018-06-28 ENCOUNTER — Ambulatory Visit (INDEPENDENT_AMBULATORY_CARE_PROVIDER_SITE_OTHER): Payer: Medicare Other | Admitting: Psychology

## 2018-06-28 DIAGNOSIS — F332 Major depressive disorder, recurrent severe without psychotic features: Secondary | ICD-10-CM

## 2018-07-05 ENCOUNTER — Telehealth: Payer: Self-pay | Admitting: *Deleted

## 2018-07-05 ENCOUNTER — Other Ambulatory Visit: Payer: Self-pay

## 2018-07-05 ENCOUNTER — Telehealth: Payer: Self-pay | Admitting: Internal Medicine

## 2018-07-05 ENCOUNTER — Encounter: Payer: Self-pay | Admitting: Adult Health

## 2018-07-05 ENCOUNTER — Ambulatory Visit (INDEPENDENT_AMBULATORY_CARE_PROVIDER_SITE_OTHER): Payer: Medicare Other | Admitting: Psychology

## 2018-07-05 ENCOUNTER — Ambulatory Visit (INDEPENDENT_AMBULATORY_CARE_PROVIDER_SITE_OTHER): Payer: Medicare Other | Admitting: Adult Health

## 2018-07-05 DIAGNOSIS — F3131 Bipolar disorder, current episode depressed, mild: Secondary | ICD-10-CM | POA: Diagnosis not present

## 2018-07-05 DIAGNOSIS — F332 Major depressive disorder, recurrent severe without psychotic features: Secondary | ICD-10-CM

## 2018-07-05 DIAGNOSIS — D510 Vitamin B12 deficiency anemia due to intrinsic factor deficiency: Secondary | ICD-10-CM

## 2018-07-05 DIAGNOSIS — L237 Allergic contact dermatitis due to plants, except food: Secondary | ICD-10-CM

## 2018-07-05 MED ORDER — TRIAMCINOLONE ACETONIDE 0.5 % EX OINT: 1.0000 "application " | TOPICAL_OINTMENT | Freq: Two times a day (BID) | CUTANEOUS | 1 refills | Status: DC

## 2018-07-05 NOTE — Telephone Encounter (Signed)
I called the pt regarding the doxy.me visit today with Tommi Rumps and she stated the B12 injections should be given every 3 weeks, not 30 days as listed on the medication list.  Patient requested a new Rx be sent to [facility].  Message sent to Dr Jerilee Hoh.

## 2018-07-05 NOTE — Progress Notes (Signed)
Virtual Visit via Video Note  I connected with Jackie Jones on 07/05/18 at  3:30 PM EDT by a video enabled telemedicine application and verified that I am speaking with the correct person using two identifiers.  Location patient: home Location provider:work or home office Persons participating in the virtual visit: patient, provider  I discussed the limitations of evaluation and management by telemedicine and the availability of in person appointments. The patient expressed understanding and agreed to proceed.   HPI: 73 year old female evaluated today for concern of poison ivy.  She reports that earlier this week she was working in her backyard and cleaning up brush when she made contact with poison ivy.  She reports a red itchy rash on her right and left forearm, right upper arm, bilateral ankles, and bilateral lower legs.  She has been applying cortisone cream that she had a prescription for in 2016 and this has been helping but she is currently out of the cream.  Denies any signs of infection or drainage   ROS: See pertinent positives and negatives per HPI.  Past Medical History:  Diagnosis Date  . Allergy   . Arthritis   . Bipolar 1 disorder, depressed ([place])   . Chicken pox   . Depression   . History of blood transfusion   . Hx: UTI (urinary tract infection)   . Hypertension   . Hypothyroidism   . Osteoporosis   . Pernicious anemia   . Rheumatic fever   . Thyroid disease     Past Surgical History:  Procedure Laterality Date  . ABDOMINAL HYSTERECTOMY     1981  . APPENDECTOMY  1981  . prolapsed rectum 2015    . TONSILLECTOMY AND ADENOIDECTOMY  1966    Family History  Problem Relation Age of Onset  . Heart disease Mother   . Arthritis Mother   . Cancer Father        bladder  . Thyroid disease Father   . Dementia Father   . Hypertension Sister   . Hypertension Brother   . Hypertension Sister   . Hypertension Sister   . Diabetes Brother       Current  Outpatient Medications:  .  acetaminophen (TYLENOL) 500 MG tablet, Take 500 mg by mouth every 6 (six) hours as needed., Disp: , Rfl:  .  alendronate (FOSAMAX) 70 MG tablet, Take 1 tablet (70 mg total) by mouth once a week. Take with a full glass of water on an empty stomach., Disp: 12 tablet, Rfl: 0 .  amLODipine (NORVASC) 10 MG tablet, Take 1 tablet (10 mg total) by mouth daily., Disp: 90 tablet, Rfl: 1 .  cyanocobalamin (,VITAMIN B-12,) 1000 MCG/ML injection, Inject 1 mL (1,000 mcg total) into the muscle every 30 (thirty) days., Disp: 10 mL, Rfl: 0 .  divalproex (DEPAKOTE ER) 500 MG 24 hr tablet, Take 1 tablet (500 mg total) by mouth at bedtime., Disp: 30 tablet, Rfl: 0 .  levothyroxine (SYNTHROID, LEVOTHROID) 100 MCG tablet, Take 1 tablet (100 mcg total) by mouth daily before breakfast., Disp: 90 tablet, Rfl: 1 .  Multiple Vitamins-Minerals (PRESERVISION AREDS) CAPS, Take by mouth., Disp: , Rfl:  .  olmesartan (BENICAR) 20 MG tablet, Take 1 tablet (20 mg total) by mouth daily., Disp: 90 tablet, Rfl: 1 .  OVER THE COUNTER MEDICATION, AllerClear 10 mg, Disp: , Rfl:  .  Polyethyl Glycol-Propyl Glycol (SYSTANE) 0.4-0.3 % SOLN, Apply to eye., Disp: , Rfl:  .  Venlafaxine HCl 225 MG TB24, Take  1 tablet (225 mg total) by mouth at bedtime., Disp: 30 tablet, Rfl: 0 .  vortioxetine HBr (TRINTELLIX) 20 MG TABS tablet, Take 1 tablet (20 mg total) by mouth daily., Disp: 30 tablet, Rfl: 0 .  triamcinolone ointment (KENALOG) 0.5 %, Apply 1 application topically 2 (two) times daily., Disp: 30 g, Rfl: 1  EXAM:  VITALS per patient if applicable:  GENERAL: alert, oriented, appears well and in no acute distress  HEENT: atraumatic, conjunttiva clear, no obvious abnormalities on inspection of external nose and ears  NECK: normal movements of the head and neck  LUNGS: on inspection no signs of respiratory distress, breathing rate appears normal, no obvious gross SOB, gasping or wheezing  CV: no obvious  cyanosis  MS: moves all visible extremities without noticeable abnormality  PSYCH/NEURO: pleasant and cooperative, no obvious depression or anxiety, speech and thought processing grossly intact  SKIN: All patches of vesicular looking rash with localized erythema on bilateral forearms, bilateral lower legs, bilateral ankles.  Rash consistent with poison ivy ASSESSMENT AND PLAN:  Discussed the following assessment and plan:  Poison ivy - Plan: triamcinolone ointment (KENALOG) 0.5 %     I discussed the assessment and treatment plan with the patient. The patient was provided an opportunity to ask questions and all were answered. The patient agreed with the plan and demonstrated an understanding of the instructions.   The patient was advised to call back or seek an in-person evaluation if the symptoms worsen or if the condition fails to improve as anticipated.   Dorothyann Peng, NP

## 2018-07-05 NOTE — Telephone Encounter (Signed)
Please advise 

## 2018-07-05 NOTE — Telephone Encounter (Signed)
Patient has poison ivy and would like a prescription be sent in to help her treat it. She has tried some over the counter and it is not working for her. Please advise [phone] (mobile)

## 2018-07-05 NOTE — Telephone Encounter (Signed)
I am not aware of any reason why B12 should be every 3 weeks instead of every 4 weeks? Ok to send in Rx for B12 if she feels comfortable doing injections.

## 2018-07-05 NOTE — Telephone Encounter (Signed)
Doxy.me appointment scheduled with Dorothyann Peng 07/05/2018.

## 2018-07-08 MED ORDER — CYANOCOBALAMIN 1000 MCG/ML IJ SOLN: 1000.0000 ug | INTRAMUSCULAR | 0 refills | Status: DC

## 2018-07-08 NOTE — Telephone Encounter (Signed)
I called the pt and informed her of the message below.  Patient states she will await the lab appt in June and continue with injections once a month.  Refill sent to the pts pharmacy.

## 2018-07-09 DIAGNOSIS — F3131 Bipolar disorder, current episode depressed, mild: Secondary | ICD-10-CM | POA: Diagnosis not present

## 2018-07-12 ENCOUNTER — Ambulatory Visit (INDEPENDENT_AMBULATORY_CARE_PROVIDER_SITE_OTHER): Payer: Medicare Other | Admitting: Psychology

## 2018-07-12 DIAGNOSIS — F332 Major depressive disorder, recurrent severe without psychotic features: Secondary | ICD-10-CM | POA: Diagnosis not present

## 2018-07-16 ENCOUNTER — Other Ambulatory Visit: Payer: Self-pay

## 2018-07-19 ENCOUNTER — Ambulatory Visit (INDEPENDENT_AMBULATORY_CARE_PROVIDER_SITE_OTHER): Payer: Medicare Other | Admitting: Psychology

## 2018-07-19 DIAGNOSIS — F339 Major depressive disorder, recurrent, unspecified: Secondary | ICD-10-CM

## 2018-07-19 DIAGNOSIS — F411 Generalized anxiety disorder: Secondary | ICD-10-CM

## 2018-07-26 ENCOUNTER — Ambulatory Visit: Payer: Medicare Other | Admitting: Psychology

## 2018-08-02 ENCOUNTER — Ambulatory Visit (INDEPENDENT_AMBULATORY_CARE_PROVIDER_SITE_OTHER): Payer: Medicare Other | Admitting: Psychology

## 2018-08-02 DIAGNOSIS — F411 Generalized anxiety disorder: Secondary | ICD-10-CM | POA: Diagnosis not present

## 2018-08-02 DIAGNOSIS — F339 Major depressive disorder, recurrent, unspecified: Secondary | ICD-10-CM

## 2018-08-06 ENCOUNTER — Telehealth: Payer: Self-pay | Admitting: Internal Medicine

## 2018-08-06 DIAGNOSIS — M810 Age-related osteoporosis without current pathological fracture: Secondary | ICD-10-CM

## 2018-08-06 MED ORDER — ALENDRONATE SODIUM 70 MG PO TABS: 70.0000 mg | ORAL_TABLET | ORAL | 0 refills | Status: DC

## 2018-08-06 NOTE — Telephone Encounter (Signed)
Copied from [place] [id number]. Topic: Quick Communication - See Telephone Encounter >> Aug 06, 2018  9:14 AM Robina Ade, Helene Kelp D wrote: CRM for notification. See Telephone encounter for: 08/06/18. Patient called and thinks she got bite by a spider and her right arm is very red and blister. She wants to know if she needs an appt to get treated for this. Please call patient back.

## 2018-08-06 NOTE — Telephone Encounter (Signed)
Copied from [place] [id number]. Topic: Quick Communication - Rx Refill/Question >> Aug 06, 2018  9:19 AM Robina Ade, Helene Kelp D wrote: Medication: alendronate (FOSAMAX) 70 MG tablet  Has the patient contacted their pharmacy? yes  (Agent: If no, request that the patient contact the pharmacy for the refill.) (Agent: If yes, when and what did the pharmacy advise?)  Preferred Pharmacy (with phone number or street name): [id number] - [place], [place] [address]: Please be advised that RX refills may take up to 3 business days. We ask that you follow-up with your pharmacy.

## 2018-08-06 NOTE — Telephone Encounter (Signed)
Left message on machine for patient to schedule a virtual visit.

## 2018-08-07 ENCOUNTER — Encounter: Payer: Self-pay | Admitting: Family Medicine

## 2018-08-07 ENCOUNTER — Ambulatory Visit (INDEPENDENT_AMBULATORY_CARE_PROVIDER_SITE_OTHER): Payer: Medicare Other | Admitting: Family Medicine

## 2018-08-07 DIAGNOSIS — W57XXXA Bitten or stung by nonvenomous insect and other nonvenomous arthropods, initial encounter: Secondary | ICD-10-CM

## 2018-08-07 DIAGNOSIS — S50861A Insect bite (nonvenomous) of right forearm, initial encounter: Secondary | ICD-10-CM | POA: Diagnosis not present

## 2018-08-07 DIAGNOSIS — T63481A Toxic effect of venom of other arthropod, accidental (unintentional), initial encounter: Secondary | ICD-10-CM | POA: Diagnosis not present

## 2018-08-07 MED ORDER — TRIAMCINOLONE ACETONIDE 0.1 % EX CREA: 1.0000 "application " | TOPICAL_CREAM | Freq: Two times a day (BID) | CUTANEOUS | 0 refills | Status: AC

## 2018-08-07 MED ORDER — DOXYCYCLINE HYCLATE 100 MG PO TABS: 100.0000 mg | ORAL_TABLET | Freq: Two times a day (BID) | ORAL | 0 refills | Status: AC

## 2018-08-07 NOTE — Telephone Encounter (Signed)
Patient called back and I scheduled an appt for today at 4pm with Dr Martinique as the PCP did not have any openings at this time.

## 2018-08-07 NOTE — Progress Notes (Signed)
Virtual Visit via Video Note   I connected with Jackie Jones on 08/07/18 at  4:00 PM EDT by a video enabled telemedicine application and verified that I am speaking with the correct person using two identifiers.  Location patient: home Location provider:home office Persons participating in the virtual visit: patient, provider  I discussed the limitations of evaluation and management by telemedicine and the availability of in person appointments. The patient expressed understanding and agreed to proceed.   HPI: Jackie Jones is a 73 yo with Hx of bipolar disorder,pernisious anemia,and HTN among some,who is c/o right forearm erythematous pruritis lesion,she thinks it was caused by spider bite.  On Saturday around 2 pm she was working on her garden when she felt a "bite" on right forearm,"very painful." Noted erythema and pruritus a few minutes later. Next day she noted local edema and Monday a center "blister" like lesion.  She applied local alcohol after cleaning area with water. She is using OTC abx ointment.  She has not noted fever,chills,odynophagia,dysphagia,cough,dyspnea,wheezing,abdominal pain,N/V,numbness,ot tingling. She has had some easy bruising but no nose/gum bleeding,gross hematuria,or blood in stool.   She states that she knows it was not a mosquito because she had repellant on.   ROS: See pertinent positives and negatives per HPI.  Past Medical History:  Diagnosis Date  . Allergy   . Arthritis   . Bipolar 1 disorder, depressed ([place])   . Chicken pox   . Depression   . History of blood transfusion   . Hx: UTI (urinary tract infection)   . Hypertension   . Hypothyroidism   . Osteoporosis   . Pernicious anemia   . Rheumatic fever   . Thyroid disease     Past Surgical History:  Procedure Laterality Date  . ABDOMINAL HYSTERECTOMY     1981  . APPENDECTOMY  1981  . prolapsed rectum 2015    . TONSILLECTOMY AND ADENOIDECTOMY  1966    Family History  Problem  Relation Age of Onset  . Heart disease Mother   . Arthritis Mother   . Cancer Father        bladder  . Thyroid disease Father   . Dementia Father   . Hypertension Sister   . Hypertension Brother   . Hypertension Sister   . Hypertension Sister   . Diabetes Brother     Social History   Socioeconomic History  . Marital status: Single    Spouse name: Not on file  . Number of children: Not on file  . Years of education: Not on file  . Highest education level: Not on file  Occupational History  . Not on file  Social Needs  . Financial resource strain: Not on file  . Food insecurity:    Worry: Not on file    Inability: Not on file  . Transportation needs:    Medical: Not on file    Non-medical: Not on file  Tobacco Use  . Smoking status: Never Smoker  . Smokeless tobacco: Never Used  Substance and Sexual Activity  . Alcohol use: Never    Frequency: Never  . Drug use: Not on file  . Sexual activity: Not on file  Lifestyle  . Physical activity:    Days per week: Not on file    Minutes per session: Not on file  . Stress: Not on file  Relationships  . Social connections:    Talks on phone: Not on file    Gets together: Not on file  Attends religious service: Not on file    Active member of club or organization: Not on file    Attends meetings of clubs or organizations: Not on file    Relationship status: Not on file  . Intimate partner violence:    Fear of current or ex partner: Not on file    Emotionally abused: Not on file    Physically abused: Not on file    Forced sexual activity: Not on file  Other Topics Concern  . Not on file  Social History Narrative  . Not on file      Current Outpatient Medications:  .  acetaminophen (TYLENOL) 500 MG tablet, Take 500 mg by mouth every 6 (six) hours as needed., Disp: , Rfl:  .  alendronate (FOSAMAX) 70 MG tablet, Take 1 tablet (70 mg total) by mouth once a week. Take with a full glass of water on an empty stomach.,  Disp: 12 tablet, Rfl: 0 .  amLODipine (NORVASC) 10 MG tablet, Take 1 tablet (10 mg total) by mouth daily., Disp: 90 tablet, Rfl: 1 .  cyanocobalamin (,VITAMIN B-12,) 1000 MCG/ML injection, Inject 1 mL (1,000 mcg total) into the muscle every 30 (thirty) days., Disp: 10 mL, Rfl: 0 .  divalproex (DEPAKOTE ER) 500 MG 24 hr tablet, Take 1 tablet (500 mg total) by mouth at bedtime., Disp: 30 tablet, Rfl: 0 .  doxycycline (VIBRA-TABS) 100 MG tablet, Take 1 tablet (100 mg total) by mouth 2 (two) times daily for 7 days., Disp: 14 tablet, Rfl: 0 .  levothyroxine (SYNTHROID, LEVOTHROID) 100 MCG tablet, Take 1 tablet (100 mcg total) by mouth daily before breakfast., Disp: 90 tablet, Rfl: 1 .  Multiple Vitamins-Minerals (PRESERVISION AREDS) CAPS, Take by mouth., Disp: , Rfl:  .  olmesartan (BENICAR) 20 MG tablet, Take 1 tablet (20 mg total) by mouth daily., Disp: 90 tablet, Rfl: 1 .  OVER THE COUNTER MEDICATION, AllerClear 10 mg, Disp: , Rfl:  .  Polyethyl Glycol-Propyl Glycol (SYSTANE) 0.4-0.3 % SOLN, Apply to eye., Disp: , Rfl:  .  triamcinolone cream (KENALOG) 0.1 %, Apply 1 application topically 2 (two) times daily for 14 days., Disp: 30 g, Rfl: 0 .  Venlafaxine HCl 225 MG TB24, Take 1 tablet (225 mg total) by mouth at bedtime., Disp: 30 tablet, Rfl: 0 .  vortioxetine HBr (TRINTELLIX) 20 MG TABS tablet, Take 1 tablet (20 mg total) by mouth daily., Disp: 30 tablet, Rfl: 0  EXAM:  VITALS per patient if applicable:N/A  GENERAL: alert, oriented, appears well and in no acute distress  HEENT: atraumatic, normocephalic, conjunctiva clear.  LUNGS: on inspection no signs of respiratory distress, breathing rate appears normal, no obvious gross SOB, gasping or wheezing  CV: no obvious cyanosis  Jackie: moves all visible extremities without noticeable abnormality  SKIN: Erythematous raised lesion with yellowish crust in center. Defined borders,I do not appreciate ecchymosis or necrotic area. See picture.   PSYCH/NEURO: pleasant and cooperative, no obvious depression or anxiety, speech and thought processing grossly intact       ASSESSMENT AND PLAN:  Discussed the following assessment and plan:  Insect bite of right forearm, initial encounter - Plan: doxycycline (VIBRA-TABS) 100 MG tablet  Local reaction to insect sting, accidental or unintentional, initial encounter - Plan: triamcinolone cream (KENALOG) 0.1 %, doxycycline (VIBRA-TABS) 100 MG tablet   Recommend keeping area clean with soap a water. Oral abx even though erythematous lesion is most likely a local reaction rather than infection, side effects of abx discussed.  Topical steroid to help with pruritus. Instructed about warning signs. F/U as needed.  I discussed the assessment and treatment plan with the patient. She  was provided an opportunity to ask questions and all were answered. The patient agreed with the plan and demonstrated an understanding of the instructions.   The patient was advised to call back or seek an in-person evaluation if the symptoms worsen or if the condition fails to improve as anticipated.  Return if symptoms worsen or fail to improve.     [place], MD

## 2018-08-09 ENCOUNTER — Ambulatory Visit (INDEPENDENT_AMBULATORY_CARE_PROVIDER_SITE_OTHER): Payer: Medicare Other | Admitting: Psychology

## 2018-08-09 DIAGNOSIS — F332 Major depressive disorder, recurrent severe without psychotic features: Secondary | ICD-10-CM

## 2018-08-16 ENCOUNTER — Ambulatory Visit (INDEPENDENT_AMBULATORY_CARE_PROVIDER_SITE_OTHER): Payer: Medicare Other | Admitting: Psychology

## 2018-08-16 DIAGNOSIS — F332 Major depressive disorder, recurrent severe without psychotic features: Secondary | ICD-10-CM

## 2018-08-20 ENCOUNTER — Other Ambulatory Visit: Payer: Self-pay

## 2018-08-20 ENCOUNTER — Ambulatory Visit (INDEPENDENT_AMBULATORY_CARE_PROVIDER_SITE_OTHER): Payer: Medicare Other | Admitting: Family Medicine

## 2018-08-20 DIAGNOSIS — L255 Unspecified contact dermatitis due to plants, except food: Secondary | ICD-10-CM

## 2018-08-20 MED ORDER — PREDNISONE 10 MG PO TABS: ORAL_TABLET | ORAL | 0 refills | Status: DC

## 2018-08-20 NOTE — Patient Instructions (Signed)
Take the prednisone as prescribed. Follow the taper instructions carefully.  I hope you are feeling better soon! Seek care promptly if your symptoms worsen, new concerns arise or you are not improving with treatment.    Poison Ivy Dermatitis  Poison ivy dermatitis is redness and soreness (inflammation) of the skin. It is caused by a chemical that is found on the leaves of the poison ivy plant. You may also have itching, a rash, and blisters. Symptoms often clear up in 1-2 weeks. You may get this condition by touching a poison ivy plant. You can also get it by touching something that has the chemical on it. This may include animals or objects that have come in contact with the plant. Follow these instructions at home: General instructions  Take or apply over-the-counter and prescription medicines only as told by your doctor.  If you touch poison ivy, wash your skin with soap and cold water right away.  Use hydrocortisone creams or calamine lotion as needed to help with itching.  Take oatmeal baths as needed. Use colloidal oatmeal. You can get this at a pharmacy or grocery store. Follow the instructions on the package.  Do not scratch or rub your skin.  While you have the rash, wash your clothes right after you wear them. Prevention   Know what poison ivy looks like so you can avoid it. This plant has three leaves with flowering branches on a single stem. The leaves are glossy. They have uneven edges that come to a point at the front.  If you have touched poison ivy, wash with soap and water right away. Be sure to wash under your fingernails.  When hiking or camping, wear long pants, a long-sleeved shirt, tall socks, and hiking boots. You can also use a lotion on your skin that helps to prevent contact with the chemical on the plant.  If you think that your clothes or outdoor gear came in contact with poison ivy, rinse them off with a garden hose before you bring them inside your  house. Contact a doctor if:  You have open sores in the rash area.  You have more redness, swelling, or pain in the affected area.  You have redness that spreads beyond the rash area.  You have fluid, blood, or pus coming from the affected area.  You have a fever.  You have a rash over a large area of your body.  You have a rash on your eyes, mouth, or genitals.  Your rash does not get better after a few days. Get help right away if:  Your face swells or your eyes swell shut.  You have trouble breathing.  You have trouble swallowing. This information is not intended to replace advice given to you by your health care provider. Make sure you discuss any questions you have with your health care provider. Document Released: 04/08/2010 Document Revised: 11/28/2017 Document Reviewed: 08/12/2014 Elsevier Interactive Patient Education  2019 [employer].    Prednisone tablets What is this medicine? PREDNISONE (PRED ni sone) is a corticosteroid. It is commonly used to treat inflammation of the skin, joints, lungs, and other organs. Common conditions treated include asthma, allergies, and arthritis. It is also used for other conditions, such as blood disorders and diseases of the adrenal glands. This medicine may be used for other purposes; ask your health care provider or pharmacist if you have questions. COMMON BRAND NAME(S): Deltasone, Predone, Sterapred, Sterapred DS What should I tell my health care provider before  I take this medicine? They need to know if you have any of these conditions: -Cushing's syndrome -diabetes -glaucoma -heart disease -high blood pressure -infection (especially a virus infection such as chickenpox, cold sores, or herpes) -kidney disease -liver disease -mental illness -myasthenia gravis -osteoporosis -seizures -stomach or intestine problems -thyroid disease -an unusual or allergic reaction to lactose, prednisone, other medicines, foods, dyes,  or preservatives -pregnant or trying to get pregnant -breast-feeding How should I use this medicine? Take this medicine by mouth with a glass of water. Follow the directions on the prescription label. Take this medicine with food. If you are taking this medicine once a day, take it in the morning. Do not take more medicine than you are told to take. Do not suddenly stop taking your medicine because you may develop a severe reaction. Your doctor will tell you how much medicine to take. If your doctor wants you to stop the medicine, the dose may be slowly lowered over time to avoid any side effects. Talk to your pediatrician regarding the use of this medicine in children. Special care may be needed. Overdosage: If you think you have taken too much of this medicine contact a poison control center or emergency room at once. NOTE: This medicine is only for you. Do not share this medicine with others. What if I miss a dose? If you miss a dose, take it as soon as you can. If it is almost time for your next dose, talk to your doctor or health care professional. You may need to miss a dose or take an extra dose. Do not take double or extra doses without advice. What may interact with this medicine? Do not take this medicine with any of the following medications: -metyrapone -mifepristone This medicine may also interact with the following medications: -aminoglutethimide -amphotericin B -aspirin and aspirin-like medicines -barbiturates -certain medicines for diabetes, like glipizide or glyburide -cholestyramine -cholinesterase inhibitors -cyclosporine -digoxin -diuretics -ephedrine -female hormones, like estrogens and birth control pills -isoniazid -ketoconazole -NSAIDS, medicines for pain and inflammation, like ibuprofen or naproxen -phenytoin -rifampin -toxoids -vaccines -warfarin This list may not describe all possible interactions. Give your health care provider a list of all the  medicines, herbs, non-prescription drugs, or dietary supplements you use. Also tell them if you smoke, drink alcohol, or use illegal drugs. Some items may interact with your medicine. What should I watch for while using this medicine? Visit your doctor or health care professional for regular checks on your progress. If you are taking this medicine over a prolonged period, carry an identification card with your name and address, the type and dose of your medicine, and your doctor's name and address. This medicine may increase your risk of getting an infection. Tell your doctor or health care professional if you are around anyone with measles or chickenpox, or if you develop sores or blisters that do not heal properly. If you are going to have surgery, tell your doctor or health care professional that you have taken this medicine within the last twelve months. Ask your doctor or health care professional about your diet. You may need to lower the amount of salt you eat. This medicine may increase blood sugar. Ask your healthcare provider if changes in diet or medicines are needed if you have diabetes. What side effects may I notice from receiving this medicine? Side effects that you should report to your doctor or health care professional as soon as possible: -allergic reactions like skin rash, itching or hives,  swelling of the face, lips, or tongue -changes in emotions or moods -changes in vision -depressed mood -eye pain -fever or chills, cough, sore throat, pain or difficulty passing urine -signs and symptoms of high blood sugar such as being more thirsty or hungry or having to urinate more than normal. You may also feel very tired or have blurry vision. -swelling of ankles, feet Side effects that usually do not require medical attention (report to your doctor or health care professional if they continue or are bothersome): -confusion, excitement, restlessness -headache -nausea, vomiting -skin  problems, acne, thin and shiny skin -trouble sleeping -weight gain This list may not describe all possible side effects. Call your doctor for medical advice about side effects. You may report side effects to FDA at 1-800-FDA-1088. Where should I keep my medicine? Keep out of the reach of children. Store at room temperature between 15 and 30 degrees C (59 and 86 degrees F). Protect from light. Keep container tightly closed. Throw away any unused medicine after the expiration date. NOTE: This sheet is a summary. It may not cover all possible information. If you have questions about this medicine, talk to your doctor, pharmacist, or health care provider.  2019 Elsevier/Gold Standard (2017-12-04 10:54:22)

## 2018-08-20 NOTE — Progress Notes (Signed)
Virtual Visit via Video Note  I connected with Jackie Jones  on 08/20/18 at  4:20 PM EDT by a video enabled telemedicine application and verified that I am speaking with the correct person using two identifiers.  Location patient: home Location provider:work or home office Persons participating in the virtual visit: patient, provider  I discussed the limitations of evaluation and management by telemedicine and the availability of in person appointments. The patient expressed understanding and agreed to proceed.   HPI:  Doing a lot of yard work and has gotten poison ivy a few times this spring. Topical steroid ointment has cleared up a few smaller patches.  However, has a bad outbreak this week with vesicular itchy rash on L ear and face, arms, and legs and torso. She has a poison ivy allergy and admits needs to hire someone to come clean out her yard so that she quits getting this! She denies fevers, malaise, SOB, lesions in or close to the eye or mucus membranes.  ROS: See pertinent positives and negatives per HPI.  Past Medical History:  Diagnosis Date  . Allergy   . Arthritis   . Bipolar 1 disorder, depressed ([place])   . Chicken pox   . Depression   . History of blood transfusion   . Hx: UTI (urinary tract infection)   . Hypertension   . Hypothyroidism   . Osteoporosis   . Pernicious anemia   . Rheumatic fever   . Thyroid disease     Past Surgical History:  Procedure Laterality Date  . ABDOMINAL HYSTERECTOMY     1981  . APPENDECTOMY  1981  . prolapsed rectum 2015    . TONSILLECTOMY AND ADENOIDECTOMY  1966    Family History  Problem Relation Age of Onset  . Heart disease Mother   . Arthritis Mother   . Cancer Father        bladder  . Thyroid disease Father   . Dementia Father   . Hypertension Sister   . Hypertension Brother   . Hypertension Sister   . Hypertension Sister   . Diabetes Brother     SOCIAL HX: see hpi   Current Outpatient Medications:  .   acetaminophen (TYLENOL) 500 MG tablet, Take 500 mg by mouth every 6 (six) hours as needed., Disp: , Rfl:  .  alendronate (FOSAMAX) 70 MG tablet, Take 1 tablet (70 mg total) by mouth once a week. Take with a full glass of water on an empty stomach., Disp: 12 tablet, Rfl: 0 .  amLODipine (NORVASC) 10 MG tablet, Take 1 tablet (10 mg total) by mouth daily., Disp: 90 tablet, Rfl: 1 .  cyanocobalamin (,VITAMIN B-12,) 1000 MCG/ML injection, Inject 1 mL (1,000 mcg total) into the muscle every 30 (thirty) days., Disp: 10 mL, Rfl: 0 .  divalproex (DEPAKOTE ER) 500 MG 24 hr tablet, Take 1 tablet (500 mg total) by mouth at bedtime., Disp: 30 tablet, Rfl: 0 .  levothyroxine (SYNTHROID, LEVOTHROID) 100 MCG tablet, Take 1 tablet (100 mcg total) by mouth daily before breakfast., Disp: 90 tablet, Rfl: 1 .  Multiple Vitamins-Minerals (PRESERVISION AREDS) CAPS, Take by mouth., Disp: , Rfl:  .  olmesartan (BENICAR) 20 MG tablet, Take 1 tablet (20 mg total) by mouth daily., Disp: 90 tablet, Rfl: 1 .  OVER THE COUNTER MEDICATION, AllerClear 10 mg, Disp: , Rfl:  .  Polyethyl Glycol-Propyl Glycol (SYSTANE) 0.4-0.3 % SOLN, Apply to eye., Disp: , Rfl:  .  predniSONE (DELTASONE) 10 MG tablet, 5  tablets (50mg ) daily for 3 days, then 4 tablets (40 mg) daily for 3 days, then 3 tablets (30mg ) daily for 3 days, then 2 tablets (20 mg) daily for 3 days, then 1 tablet (10 mg) daily for 3 days., Disp: 45 tablet, Rfl: 0 .  triamcinolone cream (KENALOG) 0.1 %, Apply 1 application topically 2 (two) times daily for 14 days., Disp: 30 g, Rfl: 0 .  Venlafaxine HCl 225 MG TB24, Take 1 tablet (225 mg total) by mouth at bedtime., Disp: 30 tablet, Rfl: 0 .  vortioxetine HBr (TRINTELLIX) 20 MG TABS tablet, Take 1 tablet (20 mg total) by mouth daily., Disp: 30 tablet, Rfl: 0  EXAM:  VITALS per patient if applicable: Wt 137 per patient  GENERAL: alert, oriented, appears well and in no acute distress  HEENT: atraumatic, conjunttiva clear, no  obvious abnormalities on inspection of external nose and ears  NECK: normal movements of the head and neck  LUNGS: on inspection no signs of respiratory distress, breathing rate appears normal, no obvious gross SOB, gasping or wheezing  CV: no obvious cyanosis  SKIN: papulovesicular rash on the L ear, L forehead, arms and legs, reports also on trunk  MS: moves all visible extremities without noticeable abnormality  PSYCH/NEURO: pleasant and cooperative, no obvious depression or anxiety, speech and thought processing grossly intact  ASSESSMENT AND PLAN:  Discussed the following assessment and plan:  Toxicodendron dermatitis  Discussed treatment options and prevention. She has opted for a prednisone taper and is well informed of the risks, proper taper to avoid rebound, return and emergency precautions. She promises me she will get someone else to clean out the rest of her yard so she can prevent further outbreaks. Follow up as needed if worsens, new concerns or does not resolve.    I discussed the assessment and treatment plan with the patient. The patient was provided an opportunity to ask questions and all were answered. The patient agreed with the plan and demonstrated an understanding of the instructions.    Lucretia Kern, DO

## 2018-08-23 ENCOUNTER — Ambulatory Visit (INDEPENDENT_AMBULATORY_CARE_PROVIDER_SITE_OTHER): Payer: Medicare Other | Admitting: Psychology

## 2018-08-23 DIAGNOSIS — F332 Major depressive disorder, recurrent severe without psychotic features: Secondary | ICD-10-CM | POA: Diagnosis not present

## 2018-08-30 ENCOUNTER — Ambulatory Visit (INDEPENDENT_AMBULATORY_CARE_PROVIDER_SITE_OTHER): Payer: Medicare Other | Admitting: Internal Medicine

## 2018-08-30 ENCOUNTER — Other Ambulatory Visit: Payer: Self-pay

## 2018-08-30 ENCOUNTER — Ambulatory Visit (INDEPENDENT_AMBULATORY_CARE_PROVIDER_SITE_OTHER): Payer: Medicare Other | Admitting: Psychology

## 2018-08-30 DIAGNOSIS — H3530 Unspecified macular degeneration: Secondary | ICD-10-CM | POA: Diagnosis not present

## 2018-08-30 DIAGNOSIS — I10 Essential (primary) hypertension: Secondary | ICD-10-CM

## 2018-08-30 DIAGNOSIS — E039 Hypothyroidism, unspecified: Secondary | ICD-10-CM | POA: Diagnosis not present

## 2018-08-30 DIAGNOSIS — F319 Bipolar disorder, unspecified: Secondary | ICD-10-CM

## 2018-08-30 DIAGNOSIS — F332 Major depressive disorder, recurrent severe without psychotic features: Secondary | ICD-10-CM | POA: Diagnosis not present

## 2018-08-30 DIAGNOSIS — D510 Vitamin B12 deficiency anemia due to intrinsic factor deficiency: Secondary | ICD-10-CM | POA: Diagnosis not present

## 2018-08-30 DIAGNOSIS — M810 Age-related osteoporosis without current pathological fracture: Secondary | ICD-10-CM

## 2018-08-30 NOTE — Progress Notes (Signed)
Virtual Visit via Video Note  I connected with Jackie Jones on 08/30/18 at 10:00 AM EDT by a video enabled telemedicine application and verified that I am speaking with the correct person using two identifiers.  Location patient: home Location provider: work office Persons participating in the virtual visit: patient, provider  I discussed the limitations of evaluation and management by telemedicine and the availability of in person appointments. The patient expressed understanding and agreed to proceed.   HPI: This is a scheduled six-month follow-up visit.  Past medical history significant for:  1.  Hypertension that has been well controlled in the past.  2.  Osteoporosis  3.  Hypothyroidism that has been well controlled in the past.  3.  Pernicious anemia on monthly B12 injections.  4.  Bipolar disorder typically of the depressed type.  She is now following with psychiatry, Dr. Fara Olden.  She has recently been flipping into mania and he has been adjusting medications.  Including decreasing doses of Effexor and Trintellix.  He has also added Valium 2.5 mg at bedtime which patient states has been a Geophysicist/field seismologist" for her as she is now sleeping well and is no longer having nightmares.  She has a good support system.  Her sister has hidden her credit card as she typically tends to spend a lot of money while manic.  She admits to engaging in very lengthy organizational skills which her sister is helping her deal with as well.  She is also seeing a therapist for CBT.  She has no acute complaints today and is scheduled for her Medicare wellness visit in August.   ROS: Constitutional: Denies fever, chills, diaphoresis, appetite change and fatigue.  HEENT: Denies photophobia, eye pain, redness, hearing loss, ear pain, congestion, sore throat, rhinorrhea, sneezing, mouth sores, trouble swallowing, neck pain, neck stiffness and tinnitus.   Respiratory: Denies SOB, DOE,  cough, chest tightness,  and wheezing.   Cardiovascular: Denies chest pain, palpitations and leg swelling.  Gastrointestinal: Denies nausea, vomiting, abdominal pain, diarrhea, constipation, blood in stool and abdominal distention.  Genitourinary: Denies dysuria, urgency, frequency, hematuria, flank pain and difficulty urinating.  Endocrine: Denies: hot or cold intolerance, sweats, changes in hair or nails, polyuria, polydipsia. Musculoskeletal: Denies myalgias, back pain, joint swelling, arthralgias and gait problem.  Skin: Denies pallor, rash and wound.  Neurological: Denies dizziness, seizures, syncope, weakness, light-headedness, numbness and headaches.  Hematological: Denies adenopathy. Easy bruising, personal or family bleeding history  Psychiatric/Behavioral: Denies suicidal ideation, confusion, nervousness, sleep disturbance and agitation   Past Medical History:  Diagnosis Date  . Allergy   . Arthritis   . Bipolar 1 disorder, depressed ([place])   . Chicken pox   . Depression   . History of blood transfusion   . Hx: UTI (urinary tract infection)   . Hypertension   . Hypothyroidism   . Osteoporosis   . Pernicious anemia   . Rheumatic fever   . Thyroid disease     Past Surgical History:  Procedure Laterality Date  . ABDOMINAL HYSTERECTOMY     1981  . APPENDECTOMY  1981  . prolapsed rectum 2015    . TONSILLECTOMY AND ADENOIDECTOMY  1966    Family History  Problem Relation Age of Onset  . Heart disease Mother   . Arthritis Mother   . Cancer Father        bladder  . Thyroid disease Father   . Dementia Father   . Hypertension Sister   .  Hypertension Brother   . Hypertension Sister   . Hypertension Sister   . Diabetes Brother     SOCIAL HX:   reports that she has never smoked. She has never used smokeless tobacco. She reports that she does not drink alcohol. No history on file for drug.   Current Outpatient Medications:  .  acetaminophen (TYLENOL) 500 MG tablet,  Take 500 mg by mouth every 6 (six) hours as needed., Disp: , Rfl:  .  alendronate (FOSAMAX) 70 MG tablet, Take 1 tablet (70 mg total) by mouth once a week. Take with a full glass of water on an empty stomach., Disp: 12 tablet, Rfl: 0 .  amLODipine (NORVASC) 10 MG tablet, Take 1 tablet (10 mg total) by mouth daily., Disp: 90 tablet, Rfl: 1 .  cyanocobalamin (,VITAMIN B-12,) 1000 MCG/ML injection, Inject 1 mL (1,000 mcg total) into the muscle every 30 (thirty) days., Disp: 10 mL, Rfl: 0 .  divalproex (DEPAKOTE ER) 500 MG 24 hr tablet, Take 1 tablet (500 mg total) by mouth at bedtime., Disp: 30 tablet, Rfl: 0 .  levothyroxine (SYNTHROID, LEVOTHROID) 100 MCG tablet, Take 1 tablet (100 mcg total) by mouth daily before breakfast., Disp: 90 tablet, Rfl: 1 .  Multiple Vitamins-Minerals (PRESERVISION AREDS) CAPS, Take by mouth., Disp: , Rfl:  .  olmesartan (BENICAR) 20 MG tablet, Take 1 tablet (20 mg total) by mouth daily., Disp: 90 tablet, Rfl: 1 .  OVER THE COUNTER MEDICATION, AllerClear 10 mg, Disp: , Rfl:  .  Polyethyl Glycol-Propyl Glycol (SYSTANE) 0.4-0.3 % SOLN, Apply to eye., Disp: , Rfl:  .  predniSONE (DELTASONE) 10 MG tablet, 5 tablets (50mg ) daily for 3 days, then 4 tablets (40 mg) daily for 3 days, then 3 tablets (30mg ) daily for 3 days, then 2 tablets (20 mg) daily for 3 days, then 1 tablet (10 mg) daily for 3 days., Disp: 45 tablet, Rfl: 0 .  Venlafaxine HCl 225 MG TB24, Take 1 tablet (225 mg total) by mouth at bedtime., Disp: 30 tablet, Rfl: 0 .  vortioxetine HBr (TRINTELLIX) 20 MG TABS tablet, Take 1 tablet (20 mg total) by mouth daily., Disp: 30 tablet, Rfl: 0  EXAM:   VITALS per patient if applicable: None reported  GENERAL: alert, oriented, appears well and in no acute distress  HEENT: atraumatic, conjunttiva clear, no obvious abnormalities on inspection of external nose and ears, wears corrective lenses  NECK: normal movements of the head and neck  LUNGS: on inspection no signs of  respiratory distress, breathing rate appears normal, no obvious gross increased work of breathing, gasping or wheezing  CV: no obvious cyanosis  MS: moves all visible extremities without noticeable abnormality  PSYCH/NEURO: pleasant and cooperative, no obvious depression or anxiety, speech and thought processing grossly intact  ASSESSMENT AND PLAN:   Bipolar 1 disorder, depressed ([place]) -Recently with manic episodes, being followed closely by psychiatry and therapist.  Essential hypertension -Has been well controlled in the past, she has not been checking her BP at home. -Continue current medications.  Hypothyroidism, unspecified type -Continue Synthroid, check TSH at well visit in August.  Pernicious anemia -Continue monthly vitamin B12 injections.  Age-related osteoporosis without current pathological fracture -Continue Fosamax.  Macular degeneration, unspecified laterality, unspecified type -Will refer to ophthalmology when I see her in August.     I discussed the assessment and treatment plan with the patient. The patient was provided an opportunity to ask questions and all were answered. The patient agreed with the plan  and demonstrated an understanding of the instructions.   The patient was advised to call back or seek an in-person evaluation if the symptoms worsen or if the condition fails to improve as anticipated.    [name], MD  [facility]

## 2018-09-06 ENCOUNTER — Ambulatory Visit (INDEPENDENT_AMBULATORY_CARE_PROVIDER_SITE_OTHER): Payer: Medicare Other | Admitting: Psychology

## 2018-09-06 DIAGNOSIS — F332 Major depressive disorder, recurrent severe without psychotic features: Secondary | ICD-10-CM | POA: Diagnosis not present

## 2018-09-13 ENCOUNTER — Ambulatory Visit (INDEPENDENT_AMBULATORY_CARE_PROVIDER_SITE_OTHER): Payer: Medicare Other | Admitting: Psychology

## 2018-09-13 DIAGNOSIS — F332 Major depressive disorder, recurrent severe without psychotic features: Secondary | ICD-10-CM | POA: Diagnosis not present

## 2018-09-23 ENCOUNTER — Ambulatory Visit (INDEPENDENT_AMBULATORY_CARE_PROVIDER_SITE_OTHER): Payer: Medicare Other | Admitting: Family Medicine

## 2018-09-23 ENCOUNTER — Ambulatory Visit: Payer: Medicare Other | Admitting: Psychology

## 2018-09-23 ENCOUNTER — Telehealth: Payer: Self-pay | Admitting: Internal Medicine

## 2018-09-23 ENCOUNTER — Other Ambulatory Visit: Payer: Self-pay

## 2018-09-23 ENCOUNTER — Encounter: Payer: Self-pay | Admitting: Family Medicine

## 2018-09-23 ENCOUNTER — Telehealth: Payer: Self-pay | Admitting: *Deleted

## 2018-09-23 DIAGNOSIS — I10 Essential (primary) hypertension: Secondary | ICD-10-CM | POA: Diagnosis not present

## 2018-09-23 DIAGNOSIS — E063 Autoimmune thyroiditis: Secondary | ICD-10-CM | POA: Diagnosis not present

## 2018-09-23 DIAGNOSIS — E038 Other specified hypothyroidism: Secondary | ICD-10-CM | POA: Diagnosis not present

## 2018-09-23 DIAGNOSIS — D510 Vitamin B12 deficiency anemia due to intrinsic factor deficiency: Secondary | ICD-10-CM

## 2018-09-23 NOTE — Progress Notes (Signed)
Subjective:    Patient ID: Jackie Jones, female    DOB: 1945-06-10, 73 y.o.   MRN: [id number]  HPI Virtual Visit via Video Note  I connected with the patient on 09/23/18 at  1:45 PM EDT by a video enabled telemedicine application and verified that I am speaking with the correct person using two identifiers.  Location patient: home Location provider:work or home office Persons participating in the virtual visit: patient, provider  I discussed the limitations of evaluation and management by telemedicine and the availability of in person appointments. The patient expressed understanding and agreed to proceed.   HPI: Here to discuss symptoms that she thinks are due to a low B12 level. She was diagnosed with pernicious anemia some years ago, and she took B12 shots every 4 weeks for several years. This took care of her symptoms for awhile, and these included weakness, poor balance, and extreme fatigue. Then in 2010 these symptoms came back and her dosing on the B12 shots was changed to every 3 weeks. This worked well for her and she felt great. Then earlier this year the dosing was changed back to every 4 weeks, and now these same symptoms have returned. She is due for a well exam with labs with Dr. Jerilee Hoh in August.    ROS: See pertinent positives and negatives per HPI.  Past Medical History:  Diagnosis Date  . Allergy   . Arthritis   . Bipolar 1 disorder, depressed ([place])   . Chicken pox   . Depression   . History of blood transfusion   . Hx: UTI (urinary tract infection)   . Hypertension   . Hypothyroidism   . Osteoporosis   . Pernicious anemia   . Rheumatic fever   . Thyroid disease     Past Surgical History:  Procedure Laterality Date  . ABDOMINAL HYSTERECTOMY     1981  . APPENDECTOMY  1981  . prolapsed rectum 2015    . TONSILLECTOMY AND ADENOIDECTOMY  1966    Family History  Problem Relation Age of Onset  . Heart disease Mother   . Arthritis Mother   .  Cancer Father        bladder  . Thyroid disease Father   . Dementia Father   . Hypertension Sister   . Hypertension Brother   . Hypertension Sister   . Hypertension Sister   . Diabetes Brother      Current Outpatient Medications:  .  acetaminophen (TYLENOL) 500 MG tablet, Take 500 mg by mouth every 6 (six) hours as needed., Disp: , Rfl:  .  alendronate (FOSAMAX) 70 MG tablet, Take 1 tablet (70 mg total) by mouth once a week. Take with a full glass of water on an empty stomach., Disp: 12 tablet, Rfl: 0 .  amLODipine (NORVASC) 10 MG tablet, Take 1 tablet (10 mg total) by mouth daily., Disp: 90 tablet, Rfl: 1 .  cyanocobalamin (,VITAMIN B-12,) 1000 MCG/ML injection, Inject 1 mL (1,000 mcg total) into the muscle every 30 (thirty) days., Disp: 10 mL, Rfl: 0 .  divalproex (DEPAKOTE ER) 500 MG 24 hr tablet, Take 1 tablet (500 mg total) by mouth at bedtime., Disp: 30 tablet, Rfl: 0 .  levothyroxine (SYNTHROID, LEVOTHROID) 100 MCG tablet, Take 1 tablet (100 mcg total) by mouth daily before breakfast., Disp: 90 tablet, Rfl: 1 .  Multiple Vitamins-Minerals (PRESERVISION AREDS) CAPS, Take by mouth., Disp: , Rfl:  .  olmesartan (BENICAR) 20 MG tablet, Take 1 tablet (20  mg total) by mouth daily., Disp: 90 tablet, Rfl: 1 .  OVER THE COUNTER MEDICATION, AllerClear 10 mg, Disp: , Rfl:  .  Polyethyl Glycol-Propyl Glycol (SYSTANE) 0.4-0.3 % SOLN, Apply to eye., Disp: , Rfl:  .  predniSONE (DELTASONE) 10 MG tablet, 5 tablets (50mg ) daily for 3 days, then 4 tablets (40 mg) daily for 3 days, then 3 tablets (30mg ) daily for 3 days, then 2 tablets (20 mg) daily for 3 days, then 1 tablet (10 mg) daily for 3 days., Disp: 45 tablet, Rfl: 0 .  Venlafaxine HCl 225 MG TB24, Take 1 tablet (225 mg total) by mouth at bedtime., Disp: 30 tablet, Rfl: 0 .  vortioxetine HBr (TRINTELLIX) 20 MG TABS tablet, Take 1 tablet (20 mg total) by mouth daily., Disp: 30 tablet, Rfl: 0  EXAM:  VITALS per patient if applicable:   GENERAL: alert, oriented, appears well and in no acute distress  HEENT: atraumatic, conjunttiva clear, no obvious abnormalities on inspection of external nose and ears  NECK: normal movements of the head and neck  LUNGS: on inspection no signs of respiratory distress, breathing rate appears normal, no obvious gross SOB, gasping or wheezing  CV: no obvious cyanosis  MS: moves all visible extremities without noticeable abnormality  PSYCH/NEURO: pleasant and cooperative, no obvious depression or anxiety, speech and thought processing grossly intact  ASSESSMENT AND PLAN: I agree these symptoms could be from a low B12 level, but they could also indicate a thyroid problem (she has a hx of Hashimoto's). We will have her come by the lab this week to check a B12 level and a thyroid panel, among other things. Then she can discuss the results with Dr. Jerilee Hoh. Alysia Penna, MD  Discussed the following assessment and plan:  No diagnosis found.     I discussed the assessment and treatment plan with the patient. The patient was provided an opportunity to ask questions and all were answered. The patient agreed with the plan and demonstrated an understanding of the instructions.   The patient was advised to call back or seek an in-person evaluation if the symptoms worsen or if the condition fails to improve as anticipated.     Review of Systems     Objective:   Physical Exam        Assessment & Plan:

## 2018-09-23 NOTE — Telephone Encounter (Signed)
Copied from [place] [phone] [id number]. Topic: General - Other >> Sep 23, 2018  9:49 AM Leward Quan A wrote: Reason for CRM: Patient called to say that she is experiencing fatigue, muscle weakness in legs, rapid heartbeat and heartburn and need an appointment but not with Dr Jerilee Hoh. Please call patient at Ph# [phone]  Clinic RN spoke with patient. Patient scheduled to see Dr. Sarajane Jews for virtual visit at 1:45 PM.

## 2018-09-23 NOTE — Telephone Encounter (Signed)
Patient is wanting schedule appt with Dr Jerilee Hoh to discus her lab work after 09/23/18 And to schedule CPE 7/12-7/20/2020 [phone]

## 2018-09-25 ENCOUNTER — Other Ambulatory Visit (INDEPENDENT_AMBULATORY_CARE_PROVIDER_SITE_OTHER): Payer: Medicare Other

## 2018-09-25 ENCOUNTER — Other Ambulatory Visit: Payer: Self-pay

## 2018-09-25 DIAGNOSIS — D510 Vitamin B12 deficiency anemia due to intrinsic factor deficiency: Secondary | ICD-10-CM

## 2018-09-25 DIAGNOSIS — I10 Essential (primary) hypertension: Secondary | ICD-10-CM

## 2018-09-25 DIAGNOSIS — E038 Other specified hypothyroidism: Secondary | ICD-10-CM | POA: Diagnosis not present

## 2018-09-25 DIAGNOSIS — E063 Autoimmune thyroiditis: Secondary | ICD-10-CM

## 2018-09-25 LAB — POC URINALSYSI DIPSTICK (AUTOMATED)
Blood, UA: NEGATIVE
Glucose, UA: NEGATIVE
Leukocytes, UA: NEGATIVE
Nitrite, UA: NEGATIVE
Protein, UA: POSITIVE — AB
Spec Grav, UA: 1.02 (ref 1.010–1.025)
Urobilinogen, UA: 0.2 E.U./dL
pH, UA: 6 (ref 5.0–8.0)

## 2018-09-25 LAB — T4, FREE: Free T4: 0.8 ng/dL (ref 0.60–1.60)

## 2018-09-25 LAB — CBC WITH DIFFERENTIAL/PLATELET
Basophils Absolute: 0.1 10*3/uL (ref 0.0–0.1)
Basophils Relative: 2.2 % (ref 0.0–3.0)
Eosinophils Absolute: 0.2 10*3/uL (ref 0.0–0.7)
Eosinophils Relative: 4.1 % (ref 0.0–5.0)
HCT: 36.5 % (ref 36.0–46.0)
Hemoglobin: 12.4 g/dL (ref 12.0–15.0)
Lymphocytes Relative: 42.3 % (ref 12.0–46.0)
Lymphs Abs: 1.7 10*3/uL (ref 0.7–4.0)
MCHC: 33.9 g/dL (ref 30.0–36.0)
MCV: 90.1 fl (ref 78.0–100.0)
Monocytes Absolute: 0.5 10*3/uL (ref 0.1–1.0)
Monocytes Relative: 12.7 % — ABNORMAL HIGH (ref 3.0–12.0)
Neutro Abs: 1.6 10*3/uL (ref 1.4–7.7)
Neutrophils Relative %: 38.7 % — ABNORMAL LOW (ref 43.0–77.0)
Platelets: 334 10*3/uL (ref 150.0–400.0)
RBC: 4.05 Mil/uL (ref 3.87–5.11)
RDW: 13.7 % (ref 11.5–15.5)
WBC: 4 10*3/uL (ref 4.0–10.5)

## 2018-09-25 LAB — T3, FREE: T3, Free: 3.1 pg/mL (ref 2.3–4.2)

## 2018-09-25 LAB — HEPATIC FUNCTION PANEL
ALT: 19 U/L (ref 0–35)
AST: 23 U/L (ref 0–37)
Albumin: 4.1 g/dL (ref 3.5–5.2)
Alkaline Phosphatase: 62 U/L (ref 39–117)
Bilirubin, Direct: 0.1 mg/dL (ref 0.0–0.3)
Total Bilirubin: 0.4 mg/dL (ref 0.2–1.2)
Total Protein: 6.6 g/dL (ref 6.0–8.3)

## 2018-09-25 LAB — LIPID PANEL
Cholesterol: 165 mg/dL (ref 0–200)
HDL: 74.5 mg/dL
LDL Cholesterol: 79 mg/dL (ref 0–99)
NonHDL: 90.08
Total CHOL/HDL Ratio: 2
Triglycerides: 56 mg/dL (ref 0.0–149.0)
VLDL: 11.2 mg/dL (ref 0.0–40.0)

## 2018-09-25 LAB — BASIC METABOLIC PANEL WITH GFR
BUN: 23 mg/dL (ref 6–23)
CO2: 25 meq/L (ref 19–32)
Calcium: 9.1 mg/dL (ref 8.4–10.5)
Chloride: 103 meq/L (ref 96–112)
Creatinine, Ser: 0.74 mg/dL (ref 0.40–1.20)
GFR: 76.45 mL/min
Glucose, Bld: 93 mg/dL (ref 70–99)
Potassium: 4.5 meq/L (ref 3.5–5.1)
Sodium: 138 meq/L (ref 135–145)

## 2018-09-25 LAB — TSH: TSH: 3.27 u[IU]/mL (ref 0.35–4.50)

## 2018-09-25 LAB — VITAMIN B12: Vitamin B-12: 321 pg/mL (ref 211–911)

## 2018-09-27 ENCOUNTER — Ambulatory Visit (INDEPENDENT_AMBULATORY_CARE_PROVIDER_SITE_OTHER): Payer: Medicare Other | Admitting: Psychology

## 2018-09-27 DIAGNOSIS — F332 Major depressive disorder, recurrent severe without psychotic features: Secondary | ICD-10-CM | POA: Diagnosis not present

## 2018-10-02 ENCOUNTER — Telehealth: Payer: Self-pay | Admitting: Internal Medicine

## 2018-10-02 DIAGNOSIS — I10 Essential (primary) hypertension: Secondary | ICD-10-CM

## 2018-10-02 MED ORDER — AMLODIPINE BESYLATE 10 MG PO TABS: 10.0000 mg | ORAL_TABLET | Freq: Every day | ORAL | 1 refills | Status: DC

## 2018-10-02 NOTE — Telephone Encounter (Signed)
Medication Refill - Medication: amLODipine (NORVASC) 10 MG tablet   Has the patient contacted their pharmacy? Yes.  Pt states pharmacy has tried to get in contact multiple times. Pt states she has been out of medication for a week. Please advise.  (Agent: If no, request that the patient contact the pharmacy for the refill.) (Agent: If yes, when and what did the pharmacy advise?)  Preferred Pharmacy (with phone number or street name):  [facility], [place] [address]  [place] [address]  Phone: [phone] Fax: [phone]  Not a 24 hour pharmacy; exact hours not known.     Agent: Please be advised that RX refills may take up to 3 business days. We ask that you follow-up with your pharmacy.

## 2018-10-02 NOTE — Telephone Encounter (Signed)
Refill sent.

## 2018-10-04 ENCOUNTER — Ambulatory Visit (INDEPENDENT_AMBULATORY_CARE_PROVIDER_SITE_OTHER): Payer: Medicare Other | Admitting: Psychology

## 2018-10-04 DIAGNOSIS — F332 Major depressive disorder, recurrent severe without psychotic features: Secondary | ICD-10-CM

## 2018-10-08 ENCOUNTER — Ambulatory Visit (INDEPENDENT_AMBULATORY_CARE_PROVIDER_SITE_OTHER): Payer: Medicare Other | Admitting: Internal Medicine

## 2018-10-08 ENCOUNTER — Other Ambulatory Visit: Payer: Self-pay

## 2018-10-08 DIAGNOSIS — E063 Autoimmune thyroiditis: Secondary | ICD-10-CM

## 2018-10-08 DIAGNOSIS — E038 Other specified hypothyroidism: Secondary | ICD-10-CM

## 2018-10-08 DIAGNOSIS — D510 Vitamin B12 deficiency anemia due to intrinsic factor deficiency: Secondary | ICD-10-CM | POA: Diagnosis not present

## 2018-10-08 NOTE — Progress Notes (Signed)
Virtual Visit via Video Note  I connected with Jackie Jones on 10/08/18 at  1:30 PM EDT by a video enabled telemedicine application and verified that I am speaking with the correct person using two identifiers.  Location patient: home Location provider: work office Persons participating in the virtual visit: patient, provider  I discussed the limitations of evaluation and management by telemedicine and the availability of in person appointments. The patient expressed understanding and agreed to proceed.   HPI: Since I last saw her, she had a visit with Dr. Sarajane Jews due to concerns about her B12 level being possibly low, due to bony pain. Labs were requested, all are normal. She wanted to follow up with me on results.   ROS: Constitutional: Denies fever, chills, diaphoresis, appetite change and fatigue.  HEENT: Denies photophobia, eye pain, redness, hearing loss, ear pain, congestion, sore throat, rhinorrhea, sneezing, mouth sores, trouble swallowing, neck pain, neck stiffness and tinnitus.   Respiratory: Denies SOB, DOE, cough, chest tightness,  and wheezing.   Cardiovascular: Denies chest pain, palpitations and leg swelling.  Gastrointestinal: Denies nausea, vomiting, abdominal pain, diarrhea, constipation, blood in stool and abdominal distention.  Genitourinary: Denies dysuria, urgency, frequency, hematuria, flank pain and difficulty urinating.  Endocrine: Denies: hot or cold intolerance, sweats, changes in hair or nails, polyuria, polydipsia. Musculoskeletal: Denies myalgias, back pain, joint swelling, arthralgias and gait problem.  Skin: Denies pallor, rash and wound.  Neurological: Denies dizziness, seizures, syncope, weakness, light-headedness, numbness and headaches.  Hematological: Denies adenopathy. Easy bruising, personal or family bleeding history  Psychiatric/Behavioral: Denies suicidal ideation, mood changes, confusion, nervousness, sleep disturbance and  agitation   Past Medical History:  Diagnosis Date  . Allergy   . Arthritis   . Bipolar 1 disorder, depressed ([place])   . Chicken pox   . Depression   . History of blood transfusion   . Hx: UTI (urinary tract infection)   . Hypertension   . Hypothyroidism   . Osteoporosis   . Pernicious anemia   . Rheumatic fever   . Thyroid disease     Past Surgical History:  Procedure Laterality Date  . ABDOMINAL HYSTERECTOMY     1981  . APPENDECTOMY  1981  . prolapsed rectum 2015    . TONSILLECTOMY AND ADENOIDECTOMY  1966    Family History  Problem Relation Age of Onset  . Heart disease Mother   . Arthritis Mother   . Cancer Father        bladder  . Thyroid disease Father   . Dementia Father   . Hypertension Sister   . Hypertension Brother   . Hypertension Sister   . Hypertension Sister   . Diabetes Brother     SOCIAL HX:   reports that she has never smoked. She has never used smokeless tobacco. She reports that she does not drink alcohol. No history on file for drug.   Current Outpatient Medications:  .  acetaminophen (TYLENOL) 500 MG tablet, Take 500 mg by mouth every 6 (six) hours as needed., Disp: , Rfl:  .  alendronate (FOSAMAX) 70 MG tablet, Take 1 tablet (70 mg total) by mouth once a week. Take with a full glass of water on an empty stomach., Disp: 12 tablet, Rfl: 0 .  amLODipine (NORVASC) 10 MG tablet, Take 1 tablet (10 mg total) by mouth daily., Disp: 90 tablet, Rfl: 1 .  cyanocobalamin (,VITAMIN B-12,) 1000 MCG/ML injection, Inject 1 mL (1,000 mcg total) into the muscle every 30 (  thirty) days., Disp: 10 mL, Rfl: 0 .  divalproex (DEPAKOTE ER) 500 MG 24 hr tablet, Take 1 tablet (500 mg total) by mouth at bedtime., Disp: 30 tablet, Rfl: 0 .  levothyroxine (SYNTHROID, LEVOTHROID) 100 MCG tablet, Take 1 tablet (100 mcg total) by mouth daily before breakfast., Disp: 90 tablet, Rfl: 1 .  Multiple Vitamins-Minerals (PRESERVISION AREDS) CAPS, Take by mouth., Disp: , Rfl:  .   olmesartan (BENICAR) 20 MG tablet, Take 1 tablet (20 mg total) by mouth daily., Disp: 90 tablet, Rfl: 1 .  OVER THE COUNTER MEDICATION, AllerClear 10 mg, Disp: , Rfl:  .  Polyethyl Glycol-Propyl Glycol (SYSTANE) 0.4-0.3 % SOLN, Apply to eye., Disp: , Rfl:  .  predniSONE (DELTASONE) 10 MG tablet, 5 tablets (50mg ) daily for 3 days, then 4 tablets (40 mg) daily for 3 days, then 3 tablets (30mg ) daily for 3 days, then 2 tablets (20 mg) daily for 3 days, then 1 tablet (10 mg) daily for 3 days., Disp: 45 tablet, Rfl: 0 .  Venlafaxine HCl 225 MG TB24, Take 1 tablet (225 mg total) by mouth at bedtime., Disp: 30 tablet, Rfl: 0 .  vortioxetine HBr (TRINTELLIX) 20 MG TABS tablet, Take 1 tablet (20 mg total) by mouth daily., Disp: 30 tablet, Rfl: 0  EXAM:   VITALS per patient if applicable: none reported  GENERAL: alert, oriented, appears well and in no acute distress  HEENT: atraumatic, conjunttiva clear, no obvious abnormalities on inspection of external nose and ears  NECK: normal movements of the head and neck  LUNGS: on inspection no signs of respiratory distress, breathing rate appears normal, no obvious gross increased work of breathing, gasping or wheezing  CV: no obvious cyanosis  MS: moves all visible extremities without noticeable abnormality  PSYCH/NEURO: pleasant and cooperative, no obvious depression or anxiety, speech and thought processing grossly intact  ASSESSMENT AND PLAN:   Pernicious anemia -B12 normal.   Hypothyroidism due to Hashimoto's thyroiditis -TSH ok.     I discussed the assessment and treatment plan with the patient. The patient was provided an opportunity to ask questions and all were answered. The patient agreed with the plan and demonstrated an understanding of the instructions.   The patient was advised to call back or seek an in-person evaluation if the symptoms worsen or if the condition fails to improve as anticipated.    [name], MD   [facility]

## 2018-10-11 ENCOUNTER — Ambulatory Visit (INDEPENDENT_AMBULATORY_CARE_PROVIDER_SITE_OTHER): Payer: Medicare Other | Admitting: Psychology

## 2018-10-11 DIAGNOSIS — F332 Major depressive disorder, recurrent severe without psychotic features: Secondary | ICD-10-CM

## 2018-10-25 ENCOUNTER — Ambulatory Visit (INDEPENDENT_AMBULATORY_CARE_PROVIDER_SITE_OTHER): Payer: Medicare Other | Admitting: Psychology

## 2018-10-25 DIAGNOSIS — F332 Major depressive disorder, recurrent severe without psychotic features: Secondary | ICD-10-CM

## 2018-10-29 ENCOUNTER — Telehealth: Payer: Self-pay | Admitting: Internal Medicine

## 2018-10-29 DIAGNOSIS — E039 Hypothyroidism, unspecified: Secondary | ICD-10-CM

## 2018-10-29 DIAGNOSIS — I10 Essential (primary) hypertension: Secondary | ICD-10-CM

## 2018-10-29 MED ORDER — OLMESARTAN MEDOXOMIL 20 MG PO TABS: 20.0000 mg | ORAL_TABLET | Freq: Every day | ORAL | 1 refills | Status: DC

## 2018-10-29 MED ORDER — LEVOTHYROXINE SODIUM 100 MCG PO TABS: 100.0000 ug | ORAL_TABLET | Freq: Every day | ORAL | 1 refills | Status: DC

## 2018-10-29 NOTE — Telephone Encounter (Signed)
Pt request refill  levothyroxine (SYNTHROID, LEVOTHROID) 100 MCG tablet  olmesartan (BENICAR) 20 MG tablet  Pt states she called pharmacy 10 daysago, but we did not receive anything. Pt is out of bp med and hopes to get today.  [id number] [address], [place] - [place] [phone] (Phone) [phone] (Fax)

## 2018-10-29 NOTE — Telephone Encounter (Signed)
Refills sent

## 2018-10-30 ENCOUNTER — Ambulatory Visit (INDEPENDENT_AMBULATORY_CARE_PROVIDER_SITE_OTHER): Payer: Medicare Other | Admitting: Internal Medicine

## 2018-10-30 ENCOUNTER — Encounter: Payer: Self-pay | Admitting: Internal Medicine

## 2018-10-30 ENCOUNTER — Other Ambulatory Visit: Payer: Self-pay

## 2018-10-30 VITALS — BP 130/80 | HR 75 | Temp 98.0°F | Ht 65.0 in | Wt 141.6 lb

## 2018-10-30 DIAGNOSIS — H3530 Unspecified macular degeneration: Secondary | ICD-10-CM

## 2018-10-30 DIAGNOSIS — E038 Other specified hypothyroidism: Secondary | ICD-10-CM | POA: Diagnosis not present

## 2018-10-30 DIAGNOSIS — H9392 Unspecified disorder of left ear: Secondary | ICD-10-CM

## 2018-10-30 DIAGNOSIS — D510 Vitamin B12 deficiency anemia due to intrinsic factor deficiency: Secondary | ICD-10-CM

## 2018-10-30 DIAGNOSIS — F319 Bipolar disorder, unspecified: Secondary | ICD-10-CM

## 2018-10-30 DIAGNOSIS — E063 Autoimmune thyroiditis: Secondary | ICD-10-CM | POA: Diagnosis not present

## 2018-10-30 DIAGNOSIS — I10 Essential (primary) hypertension: Secondary | ICD-10-CM

## 2018-10-30 DIAGNOSIS — Z0000 Encounter for general adult medical examination without abnormal findings: Secondary | ICD-10-CM | POA: Diagnosis not present

## 2018-10-30 DIAGNOSIS — M810 Age-related osteoporosis without current pathological fracture: Secondary | ICD-10-CM

## 2018-10-30 DIAGNOSIS — H6192 Disorder of left external ear, unspecified: Secondary | ICD-10-CM

## 2018-10-30 MED ORDER — ALENDRONATE SODIUM 70 MG PO TABS: 70.0000 mg | ORAL_TABLET | ORAL | 3 refills | Status: DC

## 2018-10-30 MED ORDER — AMLODIPINE BESYLATE 10 MG PO TABS: 10.0000 mg | ORAL_TABLET | Freq: Every day | ORAL | 1 refills | Status: DC

## 2018-10-30 MED ORDER — CYANOCOBALAMIN 1000 MCG/ML IJ SOLN: 1000.0000 ug | INTRAMUSCULAR | 3 refills | Status: DC

## 2018-10-30 MED ORDER — "BD SAFETYGLIDE SYRINGE/NEEDLE 25G X 1"" 3 ML MISC": 11 refills | Status: DC

## 2018-10-30 NOTE — Progress Notes (Signed)
Established Patient Office Visit     CC/Reason for Visit: Subsequent Medicare wellness visit  HPI: Jackie Jones is a 72 y.o. female who is coming in today for the above mentioned reasons. Past Medical History is significant for: Hypertension that has been well controlled, osteoporosis on alendronate, hypothyroidism well controlled, pernicious anemia on monthly B12 injections.  Bipolar disorder followed with psychiatry, Dr. Fara Olden.  She  would like me to look at a lesion in her left ear ;she had a previous basal cell carcinoma that was removed from that area.   Past Medical/Surgical History: Past Medical History:  Diagnosis Date  . Allergy   . Arthritis   . Bipolar 1 disorder, depressed ([place])   . Chicken pox   . Depression   . History of blood transfusion   . Hx: UTI (urinary tract infection)   . Hypertension   . Hypothyroidism   . Osteoporosis   . Pernicious anemia   . Rheumatic fever   . Thyroid disease     Past Surgical History:  Procedure Laterality Date  . ABDOMINAL HYSTERECTOMY     1981  . APPENDECTOMY  1981  . prolapsed rectum 2015    . TONSILLECTOMY AND ADENOIDECTOMY  1966    Social History:  reports that she has never smoked. She has never used smokeless tobacco. She reports that she does not drink alcohol. No history on Jones for drug.  Allergies: Allergies  Allergen Reactions  . Ciprofloxacin Rash  . Codeine Rash  . Levaquin [Levofloxacin] Rash  . Penicillins Rash    Family History:  Family History  Problem Relation Age of Onset  . Heart disease Mother   . Arthritis Mother   . Cancer Father        bladder  . Thyroid disease Father   . Dementia Father   . Hypertension Sister   . Hypertension Brother   . Hypertension Sister   . Hypertension Sister   . Diabetes Brother      Current Outpatient Medications:  .  acetaminophen (TYLENOL) 500 MG tablet, Take 500 mg by mouth every 6 (six) hours as needed., Disp: , Rfl:  .   alendronate (FOSAMAX) 70 MG tablet, Take 1 tablet (70 mg total) by mouth once a week. Take with a full glass of water on an empty stomach., Disp: 12 tablet, Rfl: 3 .  amLODipine (NORVASC) 10 MG tablet, Take 1 tablet (10 mg total) by mouth daily., Disp: 90 tablet, Rfl: 1 .  cyanocobalamin (,VITAMIN B-12,) 1000 MCG/ML injection, Inject 1 mL (1,000 mcg total) into the muscle every 30 (thirty) days., Disp: 30 mL, Rfl: 3 .  divalproex (DEPAKOTE ER) 500 MG 24 hr tablet, Take 1 tablet (500 mg total) by mouth at bedtime., Disp: 30 tablet, Rfl: 0 .  levothyroxine (SYNTHROID) 100 MCG tablet, Take 1 tablet (100 mcg total) by mouth daily before breakfast., Disp: 90 tablet, Rfl: 1 .  Multiple Vitamins-Minerals (PRESERVISION AREDS) CAPS, Take by mouth., Disp: , Rfl:  .  olmesartan (BENICAR) 20 MG tablet, Take 1 tablet (20 mg total) by mouth daily., Disp: 90 tablet, Rfl: 1 .  OVER THE COUNTER MEDICATION, AllerClear 10 mg, Disp: , Rfl:  .  Polyethyl Glycol-Propyl Glycol (SYSTANE) 0.4-0.3 % SOLN, Apply to eye., Disp: , Rfl:  .  Venlafaxine HCl 225 MG TB24, Take 1 tablet (225 mg total) by mouth at bedtime., Disp: 30 tablet, Rfl: 0 .  vortioxetine HBr (TRINTELLIX) 20 MG TABS tablet, Take 1  tablet (20 mg total) by mouth daily., Disp: 30 tablet, Rfl: 0 .  SYRINGE-NEEDLE, DISP, 3 ML (BD SAFETYGLIDE SYRINGE/NEEDLE) 25G X 1" 3 ML MISC, Inject 7m in deltoid once monthly, Disp: 100 each, Rfl: 11  Review of Systems:  Constitutional: Denies fever, chills, diaphoresis, appetite change and fatigue.  HEENT: Denies photophobia, eye pain, redness, hearing loss, ear pain, congestion, sore throat, rhinorrhea, sneezing, mouth sores, trouble swallowing, neck pain, neck stiffness and tinnitus.   Respiratory: Denies SOB, DOE, cough, chest tightness,  and wheezing.   Cardiovascular: Denies chest pain, palpitations and leg swelling.  Gastrointestinal: Denies nausea, vomiting, abdominal pain, diarrhea, constipation, blood in stool and  abdominal distention.  Genitourinary: Denies dysuria, urgency, frequency, hematuria, flank pain and difficulty urinating.  Endocrine: Denies: hot or cold intolerance, sweats, changes in hair or nails, polyuria, polydipsia. Musculoskeletal: Denies myalgias, back pain, joint swelling, arthralgias and gait problem.  Skin: Denies pallor, rash and wound.  Neurological: Denies dizziness, seizures, syncope, weakness, light-headedness, numbness and headaches.  Hematological: Denies adenopathy. Easy bruising, personal or family bleeding history  Psychiatric/Behavioral: Denies suicidal ideation, mood changes, confusion, nervousness, sleep disturbance and agitation    Physical Exam: Vitals:   10/30/18 0900  BP: 130/80  Pulse: 75  Temp: 98 F (36.7 C)  TempSrc: Temporal  SpO2: (!) 64%  Weight: 141 lb 9.6 oz (64.2 kg)  Height: _0  (1.651 m)    Body mass index is 23.56 kg/m.   Constitutional: NAD, calm, comfortable Eyes: PERRL, lids and conjunctivae normal, wears corrective lenses ENMT: Mucous membranes are moist. Tympanic membrane is pearly white, no erythema or bulging.  Small, pearl like round protuberance of the left concha Neck: normal, supple, no masses, no thyromegaly Respiratory: clear to auscultation bilaterally, no wheezing, no crackles. Normal respiratory effort. No accessory muscle use.  Cardiovascular: Regular rate and rhythm, no murmurs / rubs / gallops. No extremity edema. 2+ pedal pulses. No carotid bruits.  Abdomen: no tenderness, no masses palpated. No hepatosplenomegaly. Bowel sounds positive.  Musculoskeletal: no clubbing / cyanosis. No joint deformity upper and lower extremities. Good ROM, no contractures. Normal muscle tone.  Skin: no rashes, lesions, ulcers. No induration Neurologic: CN 2-12 grossly intact. Sensation intact, DTR normal. Strength 5/5 in all 4.  Psychiatric: Normal judgment and insight. Alert and oriented x 3. Normal mood.    Subsequent Medicare  wellness visit   1. Risk factors, based on past  M,S,F -cardiovascular disease risk factors include age, history of hypertension   2.  Physical activities: She does yoga every day stretching and walks.   3.  Depression/mood:  History of bipolar disorder that is currently well controlled   4.  Hearing:  No issues   5.  ADL's: Independent in all ADLs   6.  Fall risk:  Moderate fall risk as she ambulates with a cane and has balance issues   7.  Home safety: No problems identified   8.  Height weight, and visual acuity: Height and weight as above, visual acuity is 20/50 on the right, 20/80 of the left and 20/40 with both eyes.   9.  Counseling:  Advised to follow-up with psychiatry as scheduled   10. Lab orders based on risk factors: Laboratory update will be reviewed   11. Referral :  Dermatology for potential removal of left ear lesion   12. Care plan:  Follow-up with me in 6 months   13. Cognitive assessment:  No cognitive impairment identified   14. Screening: Patient provided with  a written and personalized 5-10 year screening schedule in the AVS.   yes   15. Provider List Update:   PCP, psychiatry, Dr. Fara Olden  16. Advance Directives: Full code     Office Visit from 10/30/2018 in [place] [facility] at [place]  PHQ-9 Total Score  0      Fall Risk  10/30/2018 03/01/2018  Falls in the past year? 0 0  Number falls in past yr: 0 0  Injury with Fall? 0 0     Impression and Plan:  Medicare annual wellness visit, subsequent -We will receive tetanus and shingles vaccination at pharmacy. -She has routine eye and dental care. -Labs will be updated. -Healthy lifestyle has been discussed in detail. -She has decided to hold off on all cancer screening, I agree. -She had a DEXA scan in 2019.  Age-related osteoporosis without current pathological fracture -Continue Fosamax, repeat DEXA in 2021.  Essential hypertension  -Well-controlled, continue current  meds.  Pernicious anemia  -Monthly B12 injections.  Bipolar 1 disorder, depressed ([place]) -Mood is currently stable, followed by psychiatry.  Hypothyroidism due to Hashimoto's thyroiditis -TSH was 3.270 in July 2020, continue current dose of levothyroxine  Macular degeneration, unspecified laterality, unspecified type -Continue follow-up with ophthalmology.  Lesion of left ear  -In the concha at the site of a previous BCC removal. -Will refer to dermatology.    Patient Instructions  -Nice seeing you today!!  -No lab work needed today.  -Remember to get your shingles and tetanus vaccination at the pharmacy.  -Will send referral to dermatology for your left ear lesion. It will probably need to be removed.  -Schedule follow up in 4 months. Can be virtual.   Preventive Care 65 Years and Older, Female Preventive care refers to lifestyle choices and visits with your health care provider that can promote health and wellness. This includes:  A yearly physical exam. This is also called an annual well check.  Regular dental and eye exams.  Immunizations.  Screening for certain conditions.  Healthy lifestyle choices, such as diet and exercise. What can I expect for my preventive care visit? Physical exam Your health care provider will check:  Height and weight. These may be used to calculate body mass index (BMI), which is a measurement that tells if you are at a healthy weight.  Heart rate and blood pressure.  Your skin for abnormal spots. Counseling Your health care provider may ask you questions about:  Alcohol, tobacco, and drug use.  Emotional well-being.  Home and relationship well-being.  Sexual activity.  Eating habits.  History of falls.  Memory and ability to understand (cognition).  Work and work Statistician.  Pregnancy and menstrual history. What immunizations do I need?  Influenza (flu) vaccine  This is recommended every year. Tetanus,  diphtheria, and pertussis (Tdap) vaccine  You may need a Td booster every 10 years. Varicella (chickenpox) vaccine  You may need this vaccine if you have not already been vaccinated. Zoster (shingles) vaccine  You may need this after age 78. Pneumococcal conjugate (PCV13) vaccine  One dose is recommended after age 50. Pneumococcal polysaccharide (PPSV23) vaccine  One dose is recommended after age 68. Measles, mumps, and rubella (MMR) vaccine  You may need at least one dose of MMR if you were born in 1957 or later. You may also need a second dose. Meningococcal conjugate (MenACWY) vaccine  You may need this if you have certain conditions. Hepatitis A vaccine  You may need this if you  have certain conditions or if you travel or work in places where you may be exposed to hepatitis A. Hepatitis B vaccine  You may need this if you have certain conditions or if you travel or work in places where you may be exposed to hepatitis B. Haemophilus influenzae type b (Hib) vaccine  You may need this if you have certain conditions. You may receive vaccines as individual doses or as more than one vaccine together in one shot (combination vaccines). Talk with your health care provider about the risks and benefits of combination vaccines. What tests do I need? Blood tests  Lipid and cholesterol levels. These may be checked every 5 years, or more frequently depending on your overall health.  Hepatitis C test.  Hepatitis B test. Screening  Lung cancer screening. You may have this screening every year starting at age 3 if you have a 30-pack-year history of smoking and currently smoke or have quit within the past 15 years.  Colorectal cancer screening. All adults should have this screening starting at age 27 and continuing until age 73. Your health care provider may recommend screening at age 21 if you are at increased risk. You will have tests every 1-10 years, depending on your results and  the type of screening test.  Diabetes screening. This is done by checking your blood sugar (glucose) after you have not eaten for a while (fasting). You may have this done every 1-3 years.  Mammogram. This may be done every 1-2 years. Talk with your health care provider about how often you should have regular mammograms.  BRCA-related cancer screening. This may be done if you have a family history of breast, ovarian, tubal, or peritoneal cancers. Other tests  Sexually transmitted disease (STD) testing.  Bone density scan. This is done to screen for osteoporosis. You may have this done starting at age 54. Follow these instructions at home: Eating and drinking  Eat a diet that includes fresh fruits and vegetables, whole grains, lean protein, and low-fat dairy products. Limit your intake of foods with high amounts of sugar, saturated fats, and salt.  Take vitamin and mineral supplements as recommended by your health care provider.  Do not drink alcohol if your health care provider tells you not to drink.  If you drink alcohol: ? Limit how much you have to 0-1 drink a day. ? Be aware of how much alcohol is in your drink. In the [place] one drink equals one 12 oz bottle of beer (355 mL), one 5 oz glass of wine (148 mL), or one 1 oz glass of hard liquor (44 mL). Lifestyle  Take daily care of your teeth and gums.  Stay active. Exercise for at least 30 minutes on 5 or more days each week.  Do not use any products that contain nicotine or tobacco, such as cigarettes, e-cigarettes, and chewing tobacco. If you need help quitting, ask your health care provider.  If you are sexually active, practice safe sex. Use a condom or other form of protection in order to prevent STIs (sexually transmitted infections).  Talk with your health care provider about taking a low-dose aspirin or statin. What's next?  Go to your health care provider once a year for a well check visit.  Ask your health care  provider how often you should have your eyes and teeth checked.  Stay up to date on all vaccines. This information is not intended to replace advice given to you by your health care provider. Make  sure you discuss any questions you have with your health care provider. Document Released: 04/02/2015 Document Revised: 02/28/2018 Document Reviewed: 02/28/2018 Elsevier Patient Education  2020 [place], [place] [facility]

## 2018-10-30 NOTE — Patient Instructions (Signed)
-Nice seeing you today!!  -No lab work needed today.  -Remember to get your shingles and tetanus vaccination at the pharmacy.  -Will send referral to dermatology for your left ear lesion. It will probably need to be removed.  -Schedule follow up in 4 months. Can be virtual.   Preventive Care 75 Years and Older, Female Preventive care refers to lifestyle choices and visits with your health care provider that can promote health and wellness. This includes:  A yearly physical exam. This is also called an annual well check.  Regular dental and eye exams.  Immunizations.  Screening for certain conditions.  Healthy lifestyle choices, such as diet and exercise. What can I expect for my preventive care visit? Physical exam Your health care provider will check:  Height and weight. These may be used to calculate body mass index (BMI), which is a measurement that tells if you are at a healthy weight.  Heart rate and blood pressure.  Your skin for abnormal spots. Counseling Your health care provider may ask you questions about:  Alcohol, tobacco, and drug use.  Emotional well-being.  Home and relationship well-being.  Sexual activity.  Eating habits.  History of falls.  Memory and ability to understand (cognition).  Work and work Statistician.  Pregnancy and menstrual history. What immunizations do I need?  Influenza (flu) vaccine  This is recommended every year. Tetanus, diphtheria, and pertussis (Tdap) vaccine  You may need a Td booster every 10 years. Varicella (chickenpox) vaccine  You may need this vaccine if you have not already been vaccinated. Zoster (shingles) vaccine  You may need this after age 75. Pneumococcal conjugate (PCV13) vaccine  One dose is recommended after age 75. Pneumococcal polysaccharide (PPSV23) vaccine  One dose is recommended after age 75. Measles, mumps, and rubella (MMR) vaccine  You may need at least one dose of MMR if you  were born in 1957 or later. You may also need a second dose. Meningococcal conjugate (MenACWY) vaccine  You may need this if you have certain conditions. Hepatitis A vaccine  You may need this if you have certain conditions or if you travel or work in places where you may be exposed to hepatitis A. Hepatitis B vaccine  You may need this if you have certain conditions or if you travel or work in places where you may be exposed to hepatitis B. Haemophilus influenzae type b (Hib) vaccine  You may need this if you have certain conditions. You may receive vaccines as individual doses or as more than one vaccine together in one shot (combination vaccines). Talk with your health care provider about the risks and benefits of combination vaccines. What tests do I need? Blood tests  Lipid and cholesterol levels. These may be checked every 5 years, or more frequently depending on your overall health.  Hepatitis C test.  Hepatitis B test. Screening  Lung cancer screening. You may have this screening every year starting at age 75 if you have a 30-pack-year history of smoking and currently smoke or have quit within the past 15 years.  Colorectal cancer screening. All adults should have this screening starting at age 75 and continuing until age 64. Your health care provider may recommend screening at age 75 if you are at increased risk. You will have tests every 1-10 years, depending on your results and the type of screening test.  Diabetes screening. This is done by checking your blood sugar (glucose) after you have not eaten for a while (fasting).  You may have this done every 1-3 years.  Mammogram. This may be done every 1-2 years. Talk with your health care provider about how often you should have regular mammograms.  BRCA-related cancer screening. This may be done if you have a family history of breast, ovarian, tubal, or peritoneal cancers. Other tests  Sexually transmitted disease (STD)  testing.  Bone density scan. This is done to screen for osteoporosis. You may have this done starting at age 75. Follow these instructions at home: Eating and drinking  Eat a diet that includes fresh fruits and vegetables, whole grains, lean protein, and low-fat dairy products. Limit your intake of foods with high amounts of sugar, saturated fats, and salt.  Take vitamin and mineral supplements as recommended by your health care provider.  Do not drink alcohol if your health care provider tells you not to drink.  If you drink alcohol: ? Limit how much you have to 0-1 drink a day. ? Be aware of how much alcohol is in your drink. In the [place] one drink equals one 12 oz bottle of beer (355 mL), one 5 oz glass of wine (148 mL), or one 1 oz glass of hard liquor (44 mL). Lifestyle  Take daily care of your teeth and gums.  Stay active. Exercise for at least 30 minutes on 5 or more days each week.  Do not use any products that contain nicotine or tobacco, such as cigarettes, e-cigarettes, and chewing tobacco. If you need help quitting, ask your health care provider.  If you are sexually active, practice safe sex. Use a condom or other form of protection in order to prevent STIs (sexually transmitted infections).  Talk with your health care provider about taking a low-dose aspirin or statin. What's next?  Go to your health care provider once a year for a well check visit.  Ask your health care provider how often you should have your eyes and teeth checked.  Stay up to date on all vaccines. This information is not intended to replace advice given to you by your health care provider. Make sure you discuss any questions you have with your health care provider. Document Released: 04/02/2015 Document Revised: 02/28/2018 Document Reviewed: 02/28/2018 Elsevier Patient Education  2020 [employer].

## 2018-11-01 ENCOUNTER — Ambulatory Visit (INDEPENDENT_AMBULATORY_CARE_PROVIDER_SITE_OTHER): Payer: Medicare Other | Admitting: Psychology

## 2018-11-01 DIAGNOSIS — F332 Major depressive disorder, recurrent severe without psychotic features: Secondary | ICD-10-CM

## 2018-11-08 ENCOUNTER — Ambulatory Visit: Payer: Medicare Other | Admitting: Psychology

## 2018-11-11 DIAGNOSIS — Z23 Encounter for immunization: Secondary | ICD-10-CM | POA: Diagnosis not present

## 2018-11-15 ENCOUNTER — Ambulatory Visit (INDEPENDENT_AMBULATORY_CARE_PROVIDER_SITE_OTHER): Payer: Medicare Other | Admitting: Psychology

## 2018-11-15 DIAGNOSIS — F332 Major depressive disorder, recurrent severe without psychotic features: Secondary | ICD-10-CM

## 2018-11-22 ENCOUNTER — Ambulatory Visit: Payer: Medicare Other | Admitting: Psychology

## 2018-11-28 ENCOUNTER — Ambulatory Visit: Payer: Medicare Other | Admitting: Psychology

## 2018-11-29 ENCOUNTER — Ambulatory Visit (INDEPENDENT_AMBULATORY_CARE_PROVIDER_SITE_OTHER): Payer: Medicare Other | Admitting: Psychology

## 2018-11-29 DIAGNOSIS — F332 Major depressive disorder, recurrent severe without psychotic features: Secondary | ICD-10-CM | POA: Diagnosis not present

## 2018-12-13 ENCOUNTER — Ambulatory Visit (INDEPENDENT_AMBULATORY_CARE_PROVIDER_SITE_OTHER): Payer: Medicare Other | Admitting: Psychology

## 2018-12-13 DIAGNOSIS — F332 Major depressive disorder, recurrent severe without psychotic features: Secondary | ICD-10-CM | POA: Diagnosis not present

## 2018-12-20 ENCOUNTER — Ambulatory Visit (INDEPENDENT_AMBULATORY_CARE_PROVIDER_SITE_OTHER): Payer: Medicare Other | Admitting: Psychology

## 2018-12-20 DIAGNOSIS — F332 Major depressive disorder, recurrent severe without psychotic features: Secondary | ICD-10-CM | POA: Diagnosis not present

## 2018-12-23 DIAGNOSIS — Z23 Encounter for immunization: Secondary | ICD-10-CM | POA: Diagnosis not present

## 2018-12-23 DIAGNOSIS — L299 Pruritus, unspecified: Secondary | ICD-10-CM | POA: Diagnosis not present

## 2018-12-23 DIAGNOSIS — L570 Actinic keratosis: Secondary | ICD-10-CM | POA: Diagnosis not present

## 2018-12-27 ENCOUNTER — Ambulatory Visit (INDEPENDENT_AMBULATORY_CARE_PROVIDER_SITE_OTHER): Payer: Medicare Other | Admitting: Psychology

## 2018-12-27 DIAGNOSIS — F332 Major depressive disorder, recurrent severe without psychotic features: Secondary | ICD-10-CM | POA: Diagnosis not present

## 2019-01-03 ENCOUNTER — Ambulatory Visit (INDEPENDENT_AMBULATORY_CARE_PROVIDER_SITE_OTHER): Payer: Medicare Other | Admitting: Psychology

## 2019-01-03 DIAGNOSIS — F332 Major depressive disorder, recurrent severe without psychotic features: Secondary | ICD-10-CM | POA: Diagnosis not present

## 2019-01-10 ENCOUNTER — Ambulatory Visit (INDEPENDENT_AMBULATORY_CARE_PROVIDER_SITE_OTHER): Payer: Medicare Other | Admitting: Psychology

## 2019-01-10 DIAGNOSIS — F332 Major depressive disorder, recurrent severe without psychotic features: Secondary | ICD-10-CM

## 2019-01-17 ENCOUNTER — Ambulatory Visit (INDEPENDENT_AMBULATORY_CARE_PROVIDER_SITE_OTHER): Payer: Medicare Other | Admitting: Psychology

## 2019-01-17 DIAGNOSIS — F332 Major depressive disorder, recurrent severe without psychotic features: Secondary | ICD-10-CM | POA: Diagnosis not present

## 2019-01-24 ENCOUNTER — Ambulatory Visit: Payer: Medicare Other | Admitting: Psychology

## 2019-01-24 ENCOUNTER — Ambulatory Visit (INDEPENDENT_AMBULATORY_CARE_PROVIDER_SITE_OTHER): Payer: Medicare Other | Admitting: Psychology

## 2019-01-24 DIAGNOSIS — F332 Major depressive disorder, recurrent severe without psychotic features: Secondary | ICD-10-CM

## 2019-01-31 ENCOUNTER — Ambulatory Visit (INDEPENDENT_AMBULATORY_CARE_PROVIDER_SITE_OTHER): Payer: Medicare Other | Admitting: Psychology

## 2019-01-31 DIAGNOSIS — F332 Major depressive disorder, recurrent severe without psychotic features: Secondary | ICD-10-CM

## 2019-02-07 ENCOUNTER — Ambulatory Visit: Payer: Medicare Other | Admitting: Psychology

## 2019-02-21 ENCOUNTER — Ambulatory Visit (INDEPENDENT_AMBULATORY_CARE_PROVIDER_SITE_OTHER): Payer: Medicare Other | Admitting: Psychology

## 2019-02-21 DIAGNOSIS — F332 Major depressive disorder, recurrent severe without psychotic features: Secondary | ICD-10-CM | POA: Diagnosis not present

## 2019-02-28 ENCOUNTER — Ambulatory Visit (INDEPENDENT_AMBULATORY_CARE_PROVIDER_SITE_OTHER): Payer: Medicare Other | Admitting: Psychology

## 2019-02-28 ENCOUNTER — Telehealth (INDEPENDENT_AMBULATORY_CARE_PROVIDER_SITE_OTHER): Payer: Medicare Other | Admitting: Internal Medicine

## 2019-02-28 ENCOUNTER — Other Ambulatory Visit: Payer: Self-pay

## 2019-02-28 DIAGNOSIS — E063 Autoimmune thyroiditis: Secondary | ICD-10-CM

## 2019-02-28 DIAGNOSIS — D510 Vitamin B12 deficiency anemia due to intrinsic factor deficiency: Secondary | ICD-10-CM | POA: Diagnosis not present

## 2019-02-28 DIAGNOSIS — E038 Other specified hypothyroidism: Secondary | ICD-10-CM

## 2019-02-28 DIAGNOSIS — F332 Major depressive disorder, recurrent severe without psychotic features: Secondary | ICD-10-CM

## 2019-02-28 DIAGNOSIS — I10 Essential (primary) hypertension: Secondary | ICD-10-CM | POA: Diagnosis not present

## 2019-02-28 DIAGNOSIS — F319 Bipolar disorder, unspecified: Secondary | ICD-10-CM

## 2019-02-28 DIAGNOSIS — M810 Age-related osteoporosis without current pathological fracture: Secondary | ICD-10-CM

## 2019-02-28 NOTE — Progress Notes (Signed)
Virtual Visit via Video Note  I connected with Jackie Jones on 02/28/19 at  7:30 AM EST by a video enabled telemedicine application and verified that I am speaking with the correct person using two identifiers.  Location patient: home Location provider: work office Persons participating in the virtual visit: patient, provider  I discussed the limitations of evaluation and management by telemedicine and the availability of in person appointments. The patient expressed understanding and agreed to proceed.   HPI: 72-month follow-up chronic conditions.  Jackie Jones has a history of bipolar disorder and tends towards manic as well as seasonal affective disorder.  She follows monthly with her psychiatrist, is now doing light therapy and they have been adjusting her medications.  She has a history of pernicious anemia on vitamin B12 injections monthly, history of hypothyroidism with a well controlled TSH earlier this year, she takes alendronate for osteoporosis and has history of hypertension.  She has not been checking her blood pressure recently but in her most recent office visits blood pressure has been within range.  She has no acute complaints today and states she is otherwise doing well.   ROS: Constitutional: Denies fever, chills, diaphoresis, appetite change and fatigue.  HEENT: Denies photophobia, eye pain, redness, hearing loss, ear pain, congestion, sore throat, rhinorrhea, sneezing, mouth sores, trouble swallowing, neck pain, neck stiffness and tinnitus.   Respiratory: Denies SOB, DOE, cough, chest tightness,  and wheezing.   Cardiovascular: Denies chest pain, palpitations and leg swelling.  Gastrointestinal: Denies nausea, vomiting, abdominal pain, diarrhea, constipation, blood in stool and abdominal distention.  Genitourinary: Denies dysuria, urgency, frequency, hematuria, flank pain and difficulty urinating.  Endocrine: Denies: hot or cold intolerance, sweats, changes in hair  or nails, polyuria, polydipsia. Musculoskeletal: Denies myalgias, back pain, joint swelling, arthralgias and gait problem.  Skin: Denies pallor, rash and wound.  Neurological: Denies dizziness, seizures, syncope, weakness, light-headedness, numbness and headaches.  Hematological: Denies adenopathy. Easy bruising, personal or family bleeding history  Psychiatric/Behavioral: Denies suicidal ideation, mood changes, confusion, nervousness, sleep disturbance and agitation   Past Medical History:  Diagnosis Date  . Allergy   . Arthritis   . Bipolar 1 disorder, depressed ([place])   . Chicken pox   . Depression   . History of blood transfusion   . Hx: UTI (urinary tract infection)   . Hypertension   . Hypothyroidism   . Osteoporosis   . Pernicious anemia   . Rheumatic fever   . Thyroid disease     Past Surgical History:  Procedure Laterality Date  . ABDOMINAL HYSTERECTOMY     1981  . APPENDECTOMY  1981  . prolapsed rectum 2015    . TONSILLECTOMY AND ADENOIDECTOMY  1966    Family History  Problem Relation Age of Onset  . Heart disease Mother   . Arthritis Mother   . Cancer Father        bladder  . Thyroid disease Father   . Dementia Father   . Hypertension Sister   . Hypertension Brother   . Hypertension Sister   . Hypertension Sister   . Diabetes Brother     SOCIAL HX:   reports that she has never smoked. She has never used smokeless tobacco. She reports that she does not drink alcohol. No history on file for drug.   Current Outpatient Medications:  .  acetaminophen (TYLENOL) 500 MG tablet, Take 500 mg by mouth every 6 (six) hours as needed., Disp: , Rfl:  .  alendronate (  FOSAMAX) 70 MG tablet, Take 1 tablet (70 mg total) by mouth once a week. Take with a full glass of water on an empty stomach., Disp: 12 tablet, Rfl: 3 .  amLODipine (NORVASC) 10 MG tablet, Take 1 tablet (10 mg total) by mouth daily., Disp: 90 tablet, Rfl: 1 .  cyanocobalamin (,VITAMIN B-12,) 1000 MCG/ML  injection, Inject 1 mL (1,000 mcg total) into the muscle every 30 (thirty) days., Disp: 30 mL, Rfl: 3 .  divalproex (DEPAKOTE ER) 500 MG 24 hr tablet, Take 1 tablet (500 mg total) by mouth at bedtime., Disp: 30 tablet, Rfl: 0 .  levothyroxine (SYNTHROID) 100 MCG tablet, Take 1 tablet (100 mcg total) by mouth daily before breakfast., Disp: 90 tablet, Rfl: 1 .  Multiple Vitamins-Minerals (PRESERVISION AREDS) CAPS, Take by mouth., Disp: , Rfl:  .  olmesartan (BENICAR) 20 MG tablet, Take 1 tablet (20 mg total) by mouth daily., Disp: 90 tablet, Rfl: 1 .  OVER THE COUNTER MEDICATION, AllerClear 10 mg, Disp: , Rfl:  .  Polyethyl Glycol-Propyl Glycol (SYSTANE) 0.4-0.3 % SOLN, Apply to eye., Disp: , Rfl:  .  SYRINGE-NEEDLE, DISP, 3 ML (BD SAFETYGLIDE SYRINGE/NEEDLE) 25G X 1" 3 ML MISC, Inject 53ml in deltoid once monthly, Disp: 100 each, Rfl: 11 .  Venlafaxine HCl 225 MG TB24, Take 1 tablet (225 mg total) by mouth at bedtime., Disp: 30 tablet, Rfl: 0 .  vortioxetine HBr (TRINTELLIX) 20 MG TABS tablet, Take 1 tablet (20 mg total) by mouth daily., Disp: 30 tablet, Rfl: 0  EXAM:   VITALS per patient if applicable: None reported  GENERAL: alert, oriented, appears well and in no acute distress  HEENT: atraumatic, conjunttiva clear, no obvious abnormalities on inspection of external nose and ears  NECK: normal movements of the head and neck  LUNGS: on inspection no signs of respiratory distress, breathing rate appears normal, no obvious gross increased work of breathing, gasping or wheezing  CV: no obvious cyanosis  MS: moves all visible extremities without noticeable abnormality  PSYCH/NEURO: pleasant and cooperative, no obvious depression or anxiety, speech and thought processing grossly intact  ASSESSMENT AND PLAN:   Bipolar 1 disorder, depressed ([place]) -Continue follow-up and medication adjustment with psychiatry.  Essential hypertension -Well-controlled on current regimen  Hypothyroidism  due to Hashimoto's thyroiditis -Well-controlled with a TSH of 3.270 in July 2020.  Pernicious anemia -Continue monthly B12 injections.  Age-related osteoporosis without current pathological fracture -Continue weekly alendronate.     I discussed the assessment and treatment plan with the patient. The patient was provided an opportunity to ask questions and all were answered. The patient agreed with the plan and demonstrated an understanding of the instructions.   The patient was advised to call back or seek an in-person evaluation if the symptoms worsen or if the condition fails to improve as anticipated.    [name], MD  [facility] at [facility]

## 2019-03-07 ENCOUNTER — Ambulatory Visit (INDEPENDENT_AMBULATORY_CARE_PROVIDER_SITE_OTHER): Payer: Medicare Other | Admitting: Psychology

## 2019-03-07 DIAGNOSIS — F332 Major depressive disorder, recurrent severe without psychotic features: Secondary | ICD-10-CM | POA: Diagnosis not present

## 2019-03-19 ENCOUNTER — Ambulatory Visit (INDEPENDENT_AMBULATORY_CARE_PROVIDER_SITE_OTHER): Payer: Medicare Other | Admitting: Psychology

## 2019-03-19 DIAGNOSIS — F332 Major depressive disorder, recurrent severe without psychotic features: Secondary | ICD-10-CM | POA: Diagnosis not present

## 2019-03-26 ENCOUNTER — Other Ambulatory Visit: Payer: Self-pay | Admitting: Internal Medicine

## 2019-03-26 DIAGNOSIS — E039 Hypothyroidism, unspecified: Secondary | ICD-10-CM

## 2019-03-26 DIAGNOSIS — I10 Essential (primary) hypertension: Secondary | ICD-10-CM

## 2019-03-28 ENCOUNTER — Ambulatory Visit (INDEPENDENT_AMBULATORY_CARE_PROVIDER_SITE_OTHER): Payer: Medicare Other | Admitting: Psychology

## 2019-03-28 DIAGNOSIS — F332 Major depressive disorder, recurrent severe without psychotic features: Secondary | ICD-10-CM

## 2019-04-04 ENCOUNTER — Ambulatory Visit (INDEPENDENT_AMBULATORY_CARE_PROVIDER_SITE_OTHER): Payer: Medicare Other | Admitting: Psychology

## 2019-04-04 DIAGNOSIS — F332 Major depressive disorder, recurrent severe without psychotic features: Secondary | ICD-10-CM | POA: Diagnosis not present

## 2019-04-11 ENCOUNTER — Ambulatory Visit (INDEPENDENT_AMBULATORY_CARE_PROVIDER_SITE_OTHER): Payer: Medicare Other | Admitting: Psychology

## 2019-04-11 DIAGNOSIS — F332 Major depressive disorder, recurrent severe without psychotic features: Secondary | ICD-10-CM | POA: Diagnosis not present

## 2019-04-18 ENCOUNTER — Ambulatory Visit (INDEPENDENT_AMBULATORY_CARE_PROVIDER_SITE_OTHER): Payer: Medicare Other | Admitting: Psychology

## 2019-04-18 DIAGNOSIS — F332 Major depressive disorder, recurrent severe without psychotic features: Secondary | ICD-10-CM | POA: Diagnosis not present

## 2019-04-24 ENCOUNTER — Ambulatory Visit (INDEPENDENT_AMBULATORY_CARE_PROVIDER_SITE_OTHER): Payer: Medicare Other | Admitting: Psychology

## 2019-04-24 DIAGNOSIS — F332 Major depressive disorder, recurrent severe without psychotic features: Secondary | ICD-10-CM

## 2019-04-25 ENCOUNTER — Ambulatory Visit: Payer: Medicare Other | Admitting: Psychology

## 2019-05-01 ENCOUNTER — Ambulatory Visit: Payer: Medicare Other | Admitting: Psychology

## 2019-05-02 ENCOUNTER — Ambulatory Visit: Payer: Medicare Other | Admitting: Psychology

## 2019-05-08 ENCOUNTER — Ambulatory Visit (INDEPENDENT_AMBULATORY_CARE_PROVIDER_SITE_OTHER): Payer: Medicare Other | Admitting: Psychology

## 2019-05-08 DIAGNOSIS — F332 Major depressive disorder, recurrent severe without psychotic features: Secondary | ICD-10-CM | POA: Diagnosis not present

## 2019-05-09 ENCOUNTER — Ambulatory Visit: Payer: Medicare Other | Admitting: Psychology

## 2019-05-15 ENCOUNTER — Ambulatory Visit: Payer: Medicare Other | Admitting: Psychology

## 2019-05-16 ENCOUNTER — Ambulatory Visit: Payer: Medicare Other | Admitting: Psychology

## 2019-05-16 ENCOUNTER — Ambulatory Visit (INDEPENDENT_AMBULATORY_CARE_PROVIDER_SITE_OTHER): Payer: Medicare Other | Admitting: Psychology

## 2019-05-16 DIAGNOSIS — F332 Major depressive disorder, recurrent severe without psychotic features: Secondary | ICD-10-CM

## 2019-05-22 ENCOUNTER — Ambulatory Visit: Payer: Medicare Other | Admitting: Psychology

## 2019-05-22 ENCOUNTER — Ambulatory Visit (INDEPENDENT_AMBULATORY_CARE_PROVIDER_SITE_OTHER): Payer: Medicare Other | Admitting: Psychology

## 2019-05-22 DIAGNOSIS — F332 Major depressive disorder, recurrent severe without psychotic features: Secondary | ICD-10-CM

## 2019-05-23 ENCOUNTER — Ambulatory Visit: Payer: Medicare Other | Admitting: Psychology

## 2019-05-27 ENCOUNTER — Telehealth (INDEPENDENT_AMBULATORY_CARE_PROVIDER_SITE_OTHER): Payer: Medicare Other | Admitting: Family

## 2019-05-27 DIAGNOSIS — I10 Essential (primary) hypertension: Secondary | ICD-10-CM

## 2019-05-27 DIAGNOSIS — E038 Other specified hypothyroidism: Secondary | ICD-10-CM | POA: Diagnosis not present

## 2019-05-27 DIAGNOSIS — M159 Polyosteoarthritis, unspecified: Secondary | ICD-10-CM | POA: Diagnosis not present

## 2019-05-27 DIAGNOSIS — F319 Bipolar disorder, unspecified: Secondary | ICD-10-CM | POA: Diagnosis not present

## 2019-05-27 MED ORDER — CELECOXIB 50 MG PO CAPS: 50.0000 mg | ORAL_CAPSULE | Freq: Two times a day (BID) | ORAL | 1 refills | Status: DC

## 2019-05-28 NOTE — Progress Notes (Signed)
I connected with  Jackie Jones on 05/27/2019 by a video enabled telemedicine application and verified that I am speaking with the correct person using two identifiers.  Visit location: Patient-home/Provider-clinic [facility]  74 year old female is in today with c/o bowel and bladder incontinence that has been ongoing for months. She reports wearing a diaper but does not have any warning of a bowel movement prior to it occurring. She reports seeing a neuro-surg in [place] who suggested she may need surgery. Patient declines as this time. She reports being legally blind. Her sister assists her with her shopping and business affairs. Report having increased osteoarthritic pain. Not taking any medication but would like to try celebrex. Pain 9.5/10 and the worse it has ever been  O: A&O, NAD  A: Osteoarthritis, encopresis  Jackie Jones was seen today for encopresis and osteoarthritis.  Diagnoses and all orders for this visit:  Hypertension, unspecified type -     Basic Metabolic Panel -     CBC with Differential/Platelets  Other specified hypothyroidism -     TSH -     CBC with Differential/Platelets  Bipolar 1 disorder, depressed ([facility])  Osteoarthritis of multiple joints, unspecified osteoarthritis type -     Basic Metabolic Panel -     CBC with Differential/Platelets  Other orders -     celecoxib (CELEBREX) 50 MG capsule; Take 1 capsule (50 mg total) by mouth 2 (two) times daily.  Follow-up in 3 months. Warned about the possibility of bleeding with NSAIDS. Watch for dark black stools or blood in the stools. Refer to neurology if patient changes her mind    I discussed the limitations of evaluation and management by telemedicine. The patient expressed understanding and agreed to proceed.

## 2019-05-29 ENCOUNTER — Ambulatory Visit: Payer: Medicare Other | Admitting: Psychology

## 2019-05-29 ENCOUNTER — Ambulatory Visit (INDEPENDENT_AMBULATORY_CARE_PROVIDER_SITE_OTHER): Payer: Medicare Other | Admitting: Psychology

## 2019-05-29 DIAGNOSIS — F332 Major depressive disorder, recurrent severe without psychotic features: Secondary | ICD-10-CM

## 2019-05-30 ENCOUNTER — Ambulatory Visit: Payer: Medicare Other | Admitting: Psychology

## 2019-06-02 ENCOUNTER — Telehealth: Payer: Self-pay | Admitting: Internal Medicine

## 2019-06-02 NOTE — Telephone Encounter (Signed)
Pt appt for 05/27/19 shows no show but pt actually was seen by Druscilla Brownie and would like that corrected.

## 2019-06-03 NOTE — Telephone Encounter (Signed)
Can you please help this patient?  thanks

## 2019-06-04 ENCOUNTER — Other Ambulatory Visit: Payer: Self-pay

## 2019-06-05 ENCOUNTER — Other Ambulatory Visit (INDEPENDENT_AMBULATORY_CARE_PROVIDER_SITE_OTHER): Payer: Medicare Other

## 2019-06-05 ENCOUNTER — Ambulatory Visit (INDEPENDENT_AMBULATORY_CARE_PROVIDER_SITE_OTHER): Payer: Medicare Other | Admitting: Psychology

## 2019-06-05 DIAGNOSIS — I10 Essential (primary) hypertension: Secondary | ICD-10-CM | POA: Diagnosis not present

## 2019-06-05 DIAGNOSIS — M159 Polyosteoarthritis, unspecified: Secondary | ICD-10-CM

## 2019-06-05 DIAGNOSIS — E038 Other specified hypothyroidism: Secondary | ICD-10-CM

## 2019-06-05 DIAGNOSIS — F332 Major depressive disorder, recurrent severe without psychotic features: Secondary | ICD-10-CM

## 2019-06-05 LAB — CBC WITH DIFFERENTIAL/PLATELET
Basophils Absolute: 0.1 10*3/uL (ref 0.0–0.1)
Basophils Relative: 1.1 % (ref 0.0–3.0)
Eosinophils Absolute: 0.2 10*3/uL (ref 0.0–0.7)
Eosinophils Relative: 3 % (ref 0.0–5.0)
HCT: 36.1 % (ref 36.0–46.0)
Hemoglobin: 12.2 g/dL (ref 12.0–15.0)
Lymphocytes Relative: 33 % (ref 12.0–46.0)
Lymphs Abs: 2.4 10*3/uL (ref 0.7–4.0)
MCHC: 33.7 g/dL (ref 30.0–36.0)
MCV: 91.3 fl (ref 78.0–100.0)
Monocytes Absolute: 0.6 10*3/uL (ref 0.1–1.0)
Monocytes Relative: 7.9 % (ref 3.0–12.0)
Neutro Abs: 4 10*3/uL (ref 1.4–7.7)
Neutrophils Relative %: 55 % (ref 43.0–77.0)
Platelets: 279 10*3/uL (ref 150.0–400.0)
RBC: 3.96 Mil/uL (ref 3.87–5.11)
RDW: 12 % (ref 11.5–15.5)
WBC: 7.3 10*3/uL (ref 4.0–10.5)

## 2019-06-05 LAB — BASIC METABOLIC PANEL
BUN: 32 mg/dL — ABNORMAL HIGH (ref 6–23)
CO2: 28 mEq/L (ref 19–32)
Calcium: 9.6 mg/dL (ref 8.4–10.5)
Chloride: 95 mEq/L — ABNORMAL LOW (ref 96–112)
Creatinine, Ser: 0.7 mg/dL (ref 0.40–1.20)
GFR: 81.36 mL/min (ref >60.00)
Glucose, Bld: 90 mg/dL (ref 70–99)
Potassium: 4.1 mEq/L (ref 3.5–5.1)
Sodium: 131 mEq/L — ABNORMAL LOW (ref 135–145)

## 2019-06-05 LAB — TSH: TSH: 5.67 u[IU]/mL — ABNORMAL HIGH (ref 0.35–4.50)

## 2019-06-05 NOTE — Addendum Note (Signed)
Addended by: Suzette Battiest on: 06/05/2019 08:27 AM   Modules accepted: Orders

## 2019-06-06 ENCOUNTER — Ambulatory Visit: Payer: Medicare Other | Admitting: Psychology

## 2019-06-06 ENCOUNTER — Other Ambulatory Visit: Payer: Self-pay | Admitting: Internal Medicine

## 2019-06-06 DIAGNOSIS — E063 Autoimmune thyroiditis: Secondary | ICD-10-CM

## 2019-06-06 DIAGNOSIS — E038 Other specified hypothyroidism: Secondary | ICD-10-CM

## 2019-06-06 MED ORDER — LEVOTHYROXINE SODIUM 125 MCG PO TABS: 125.0000 ug | ORAL_TABLET | Freq: Every day | ORAL | 1 refills | Status: DC

## 2019-06-12 ENCOUNTER — Ambulatory Visit: Payer: Medicare Other | Admitting: Psychology

## 2019-06-13 ENCOUNTER — Ambulatory Visit (INDEPENDENT_AMBULATORY_CARE_PROVIDER_SITE_OTHER): Payer: Medicare Other | Admitting: Psychology

## 2019-06-13 ENCOUNTER — Ambulatory Visit: Payer: Medicare Other | Admitting: Psychology

## 2019-06-13 DIAGNOSIS — F332 Major depressive disorder, recurrent severe without psychotic features: Secondary | ICD-10-CM

## 2019-06-19 ENCOUNTER — Ambulatory Visit (INDEPENDENT_AMBULATORY_CARE_PROVIDER_SITE_OTHER): Payer: Medicare Other | Admitting: Psychology

## 2019-06-19 DIAGNOSIS — F332 Major depressive disorder, recurrent severe without psychotic features: Secondary | ICD-10-CM | POA: Diagnosis not present

## 2019-06-20 ENCOUNTER — Ambulatory Visit: Payer: Medicare Other | Admitting: Psychology

## 2019-06-24 DIAGNOSIS — F3131 Bipolar disorder, current episode depressed, mild: Secondary | ICD-10-CM | POA: Diagnosis not present

## 2019-06-26 ENCOUNTER — Ambulatory Visit: Payer: Medicare Other | Admitting: Psychology

## 2019-06-27 ENCOUNTER — Ambulatory Visit: Payer: Medicare Other | Admitting: Psychology

## 2019-07-03 ENCOUNTER — Ambulatory Visit: Payer: Medicare Other | Admitting: Psychology

## 2019-07-04 ENCOUNTER — Ambulatory Visit: Payer: Medicare Other | Admitting: Psychology

## 2019-07-09 ENCOUNTER — Telehealth (INDEPENDENT_AMBULATORY_CARE_PROVIDER_SITE_OTHER): Payer: Medicare Other | Admitting: Internal Medicine

## 2019-07-09 ENCOUNTER — Other Ambulatory Visit: Payer: Self-pay

## 2019-07-09 DIAGNOSIS — L237 Allergic contact dermatitis due to plants, except food: Secondary | ICD-10-CM | POA: Diagnosis not present

## 2019-07-09 MED ORDER — PREDNISONE 10 MG (21) PO TBPK: ORAL_TABLET | ORAL | 0 refills | Status: DC

## 2019-07-09 NOTE — Progress Notes (Signed)
Virtual Visit via Telephone Note  I connected with Jackie Jones on 07/09/19 at  3:45 PM EDT by telephone and verified that I am speaking with the correct person using two identifiers.   I discussed the limitations, risks, security and privacy concerns of performing an evaluation and management service by telephone and the availability of in person appointments. I also discussed with the patient that there may be a patient responsible charge related to this service. The patient expressed understanding and agreed to proceed.  Location patient: home Location provider: work office Participants present for the call: patient, provider Patient did not have a visit in the prior 7 days to address this/these issue(s).   History of Present Illness:  She has scheduled this visit due to a poison ivy rash.  She was gardening and forgot where the poison ivy was in her yard.  She has it over multiple areas of her body including her private area, her right knee, her left right arm, behind the ears, on the ear and behind her neck.  She has been trying triamcinolone cream which helps.   Observations/Objective: Patient sounds cheerful and well on the phone. I do not appreciate any increased work of breathing. Speech and thought processing are grossly intact. Patient reported vitals: None reported   Current Outpatient Medications:  .  acetaminophen (TYLENOL) 500 MG tablet, Take 500 mg by mouth every 6 (six) hours as needed., Disp: , Rfl:  .  alendronate (FOSAMAX) 70 MG tablet, Take 1 tablet (70 mg total) by mouth once a week. Take with a full glass of water on an empty stomach., Disp: 12 tablet, Rfl: 3 .  amLODipine (NORVASC) 10 MG tablet, Take 1 tablet (10 mg total) by mouth daily., Disp: 90 tablet, Rfl: 1 .  celecoxib (CELEBREX) 50 MG capsule, Take 1 capsule (50 mg total) by mouth 2 (two) times daily., Disp: 60 capsule, Rfl: 1 .  cyanocobalamin (,VITAMIN B-12,) 1000 MCG/ML injection, Inject 1  mL (1,000 mcg total) into the muscle every 30 (thirty) days., Disp: 30 mL, Rfl: 3 .  divalproex (DEPAKOTE ER) 500 MG 24 hr tablet, Take 1 tablet (500 mg total) by mouth at bedtime., Disp: 30 tablet, Rfl: 0 .  levothyroxine (SYNTHROID) 125 MCG tablet, Take 1 tablet (125 mcg total) by mouth daily., Disp: 90 tablet, Rfl: 1 .  Multiple Vitamins-Minerals (PRESERVISION AREDS) CAPS, Take by mouth., Disp: , Rfl:  .  olmesartan (BENICAR) 20 MG tablet, TAKE 1 TABLET(20 MG) BY MOUTH DAILY, Disp: 90 tablet, Rfl: 1 .  OVER THE COUNTER MEDICATION, AllerClear 10 mg, Disp: , Rfl:  .  Polyethyl Glycol-Propyl Glycol (SYSTANE) 0.4-0.3 % SOLN, Apply to eye., Disp: , Rfl:  .  predniSONE (STERAPRED UNI-PAK 21 TAB) 10 MG (21) TBPK tablet, Take as directed, Disp: 21 tablet, Rfl: 0 .  SYRINGE-NEEDLE, DISP, 3 ML (BD SAFETYGLIDE SYRINGE/NEEDLE) 25G X 1" 3 ML MISC, Inject 27ml in deltoid once monthly, Disp: 100 each, Rfl: 11 .  Venlafaxine HCl 225 MG TB24, Take 1 tablet (225 mg total) by mouth at bedtime., Disp: 30 tablet, Rfl: 0 .  vortioxetine HBr (TRINTELLIX) 20 MG TABS tablet, Take 1 tablet (20 mg total) by mouth daily., Disp: 30 tablet, Rfl: 0  Review of Systems:  Constitutional: Denies fever, chills, diaphoresis, appetite change and fatigue.  HEENT: Denies photophobia, eye pain, redness, hearing loss, ear pain, congestion, sore throat, rhinorrhea, sneezing, mouth sores, trouble swallowing, neck pain, neck stiffness and tinnitus.   Respiratory: Denies  SOB, DOE, cough, chest tightness,  and wheezing.   Cardiovascular: Denies chest pain, palpitations and leg swelling.  Gastrointestinal: Denies nausea, vomiting, abdominal pain, diarrhea, constipation, blood in stool and abdominal distention.  Genitourinary: Denies dysuria, urgency, frequency, hematuria, flank pain and difficulty urinating.  Endocrine: Denies: hot or cold intolerance, sweats, changes in hair or nails, polyuria, polydipsia. Musculoskeletal: Denies myalgias,  back pain, joint swelling, arthralgias and gait problem.  Skin: Denies pallor. Neurological: Denies dizziness, seizures, syncope, weakness, light-headedness, numbness and headaches.  Hematological: Denies adenopathy. Easy bruising, personal or family bleeding history  Psychiatric/Behavioral: Denies suicidal ideation, mood changes, confusion, nervousness, sleep disturbance and agitation   Assessment and Plan:  Poison ivy dermatitis  - Plan: predniSONE (STERAPRED UNI-PAK 21 TAB) 10 MG (21) TBPK tablet -She will reach out to [place] if no improvement.    I discussed the assessment and treatment plan with the patient. The patient was provided an opportunity to ask questions and all were answered. The patient agreed with the plan and demonstrated an understanding of the instructions.   The patient was advised to call back or seek an in-person evaluation if the symptoms worsen or if the condition fails to improve as anticipated.  I provided 13 minutes of non-face-to-face time during this encounter.   [name], MD [facility] at [facility]

## 2019-07-10 ENCOUNTER — Ambulatory Visit (INDEPENDENT_AMBULATORY_CARE_PROVIDER_SITE_OTHER): Payer: Medicare Other | Admitting: Psychology

## 2019-07-10 DIAGNOSIS — F332 Major depressive disorder, recurrent severe without psychotic features: Secondary | ICD-10-CM

## 2019-07-10 DIAGNOSIS — H353122 Nonexudative age-related macular degeneration, left eye, intermediate dry stage: Secondary | ICD-10-CM | POA: Diagnosis not present

## 2019-07-10 DIAGNOSIS — H16223 Keratoconjunctivitis sicca, not specified as Sjogren's, bilateral: Secondary | ICD-10-CM | POA: Diagnosis not present

## 2019-07-10 DIAGNOSIS — H353111 Nonexudative age-related macular degeneration, right eye, early dry stage: Secondary | ICD-10-CM | POA: Diagnosis not present

## 2019-07-10 DIAGNOSIS — H35372 Puckering of macula, left eye: Secondary | ICD-10-CM | POA: Diagnosis not present

## 2019-07-17 ENCOUNTER — Ambulatory Visit (INDEPENDENT_AMBULATORY_CARE_PROVIDER_SITE_OTHER): Payer: Medicare Other | Admitting: Psychology

## 2019-07-17 DIAGNOSIS — F332 Major depressive disorder, recurrent severe without psychotic features: Secondary | ICD-10-CM

## 2019-07-22 ENCOUNTER — Other Ambulatory Visit: Payer: Self-pay

## 2019-07-23 ENCOUNTER — Other Ambulatory Visit (INDEPENDENT_AMBULATORY_CARE_PROVIDER_SITE_OTHER): Payer: Medicare Other

## 2019-07-23 DIAGNOSIS — E038 Other specified hypothyroidism: Secondary | ICD-10-CM | POA: Diagnosis not present

## 2019-07-23 DIAGNOSIS — E063 Autoimmune thyroiditis: Secondary | ICD-10-CM | POA: Diagnosis not present

## 2019-07-23 LAB — TSH: TSH: 3.75 u[IU]/mL (ref 0.35–4.50)

## 2019-07-24 ENCOUNTER — Ambulatory Visit: Payer: Medicare Other | Admitting: Psychology

## 2019-07-27 ENCOUNTER — Other Ambulatory Visit: Payer: Self-pay | Admitting: Family

## 2019-07-30 ENCOUNTER — Ambulatory Visit (INDEPENDENT_AMBULATORY_CARE_PROVIDER_SITE_OTHER): Payer: Medicare Other | Admitting: Psychology

## 2019-07-30 DIAGNOSIS — F332 Major depressive disorder, recurrent severe without psychotic features: Secondary | ICD-10-CM | POA: Diagnosis not present

## 2019-08-11 ENCOUNTER — Telehealth: Payer: Self-pay | Admitting: Internal Medicine

## 2019-08-11 NOTE — Telephone Encounter (Signed)
Patient mailed in [place] forms  Fax forms to: [phone] ATTN: [facility] [place] Eligibility Department  Disposition: Dr's Dierdre Highman

## 2019-08-12 NOTE — Telephone Encounter (Signed)
Placed in Dr Hernandez's folder 

## 2019-08-14 ENCOUNTER — Ambulatory Visit: Payer: Medicare Other | Admitting: Psychology

## 2019-08-21 NOTE — Telephone Encounter (Signed)
Copy mailed to home address per patient request

## 2019-08-21 NOTE — Telephone Encounter (Signed)
Form faxed, confirmed, and copy made for the chart.  Patient is aware.

## 2019-09-04 ENCOUNTER — Ambulatory Visit (INDEPENDENT_AMBULATORY_CARE_PROVIDER_SITE_OTHER): Payer: Medicare Other | Admitting: Psychology

## 2019-09-04 DIAGNOSIS — F3110 Bipolar disorder, current episode manic without psychotic features, unspecified: Secondary | ICD-10-CM

## 2019-09-20 ENCOUNTER — Other Ambulatory Visit: Payer: Self-pay | Admitting: Family

## 2019-09-22 ENCOUNTER — Other Ambulatory Visit: Payer: Self-pay | Admitting: Internal Medicine

## 2019-09-22 DIAGNOSIS — I10 Essential (primary) hypertension: Secondary | ICD-10-CM

## 2019-09-25 ENCOUNTER — Ambulatory Visit (INDEPENDENT_AMBULATORY_CARE_PROVIDER_SITE_OTHER): Payer: Medicare Other | Admitting: Psychology

## 2019-09-25 DIAGNOSIS — F3110 Bipolar disorder, current episode manic without psychotic features, unspecified: Secondary | ICD-10-CM | POA: Diagnosis not present

## 2019-09-30 ENCOUNTER — Telehealth: Payer: Self-pay | Admitting: Internal Medicine

## 2019-09-30 NOTE — Telephone Encounter (Signed)
Pt sent mychart request for Medicare Wellness Visit. Called pt to be sure she is aware the visit will be with the Health coach. Waiting for call back before scheduling the appt.

## 2019-10-07 ENCOUNTER — Ambulatory Visit: Payer: Medicare Other

## 2019-10-07 ENCOUNTER — Other Ambulatory Visit: Payer: Self-pay

## 2019-10-07 DIAGNOSIS — Z1231 Encounter for screening mammogram for malignant neoplasm of breast: Secondary | ICD-10-CM

## 2019-10-07 DIAGNOSIS — Z780 Asymptomatic menopausal state: Secondary | ICD-10-CM

## 2019-10-07 DIAGNOSIS — Z0000 Encounter for general adult medical examination without abnormal findings: Secondary | ICD-10-CM

## 2019-10-07 NOTE — Progress Notes (Signed)
Subjective:   Jackie Jones is a 74 y.o. female who presents for Medicare Annual (Subsequent) preventive examination.  I connected with Jackie Jones today by telephone and verified that I am speaking with the correct person using two identifiers. Location patient: home Location provider: work Persons participating in the virtual visit: patient, provider.   I discussed the limitations, risks, security and privacy concerns of performing an evaluation and management service by telephone and the availability of in person appointments. I also discussed with the patient that there may be a patient responsible charge related to this service. The patient expressed understanding and verbally consented to this telephonic visit.    Interactive audio and video telecommunications were attempted between this provider and patient, however failed, due to patient having technical difficulties OR patient did not have access to video capability.  We continued and completed visit with audio only.     Review of Systems    N/A        Objective:    Today's Vitals   10/07/19 0941  PainSc: 9    There is no height or weight on file to calculate BMI.  Advanced Directives 10/07/2019  Does Patient Have a Medical Advance Directive? Yes  Type of Paramedic of [place];Living will  Does patient want to make changes to medical advance directive? No - Patient declined  Copy of [place] in Chart? Yes - validated most recent copy scanned in chart (See row information)    Current Medications (verified) Outpatient Encounter Medications as of 10/07/2019  Medication Sig   acetaminophen (TYLENOL) 500 MG tablet Take 500 mg by mouth every 6 (six) hours as needed.   alendronate (FOSAMAX) 70 MG tablet Take 1 tablet (70 mg total) by mouth once a week. Take with a full glass of water on an empty stomach.   amLODipine (NORVASC) 10 MG tablet TAKE 1 TABLET(10 MG) BY  MOUTH DAILY   celecoxib (CELEBREX) 50 MG capsule TAKE 1 CAPSULE(50 MG) BY MOUTH TWICE DAILY   cyanocobalamin (,VITAMIN B-12,) 1000 MCG/ML injection Inject 1 mL (1,000 mcg total) into the muscle every 30 (thirty) days.   divalproex (DEPAKOTE ER) 500 MG 24 hr tablet Take 1 tablet (500 mg total) by mouth at bedtime.   levothyroxine (SYNTHROID) 125 MCG tablet Take 1 tablet (125 mcg total) by mouth daily.   Multiple Vitamins-Minerals (PRESERVISION AREDS) CAPS Take by mouth.   olmesartan (BENICAR) 20 MG tablet TAKE 1 TABLET(20 MG) BY MOUTH DAILY   OVER THE COUNTER MEDICATION AllerClear 10 mg   Polyethyl Glycol-Propyl Glycol (SYSTANE) 0.4-0.3 % SOLN Apply to eye.   RESTASIS 0.05 % ophthalmic emulsion 1 drop 2 (two) times daily.   SYRINGE-NEEDLE, DISP, 3 ML (BD SAFETYGLIDE SYRINGE/NEEDLE) 25G X 1" 3 ML MISC Inject 36ml in deltoid once monthly   Venlafaxine HCl 225 MG TB24 Take 1 tablet (225 mg total) by mouth at bedtime.   vortioxetine HBr (TRINTELLIX) 20 MG TABS tablet Take 1 tablet (20 mg total) by mouth daily.   [DISCONTINUED] predniSONE (STERAPRED UNI-PAK 21 TAB) 10 MG (21) TBPK tablet Take as directed   No facility-administered encounter medications on file as of 10/07/2019.    Allergies (verified) Ciprofloxacin, Codeine, Levaquin [levofloxacin], and Penicillins   History: Past Medical History:  Diagnosis Date   Allergy    Arthritis    Bipolar 1 disorder, depressed ([employer])    Chicken pox    Depression    History of blood transfusion  Hx: UTI (urinary tract infection)    Hypertension    Hypothyroidism    Osteoporosis    Pernicious anemia    Rheumatic fever    Thyroid disease    Past Surgical History:  Procedure Laterality Date   ABDOMINAL HYSTERECTOMY     1981   APPENDECTOMY  1981   prolapsed rectum 2015     TONSILLECTOMY AND ADENOIDECTOMY  1966   Family History  Problem Relation Age of Onset   Heart disease Mother    Arthritis Mother     Cancer Father        bladder   Thyroid disease Father    Dementia Father    Hypertension Sister    Hypertension Brother    Hypertension Sister    Hypertension Sister    Diabetes Brother    Social History   Socioeconomic History   Marital status: Single    Spouse name: Not on file   Number of children: Not on file   Years of education: Not on file   Highest education level: Not on file  Occupational History   Not on file  Tobacco Use   Smoking status: Never Smoker   Smokeless tobacco: Never Used  Substance and Sexual Activity   Alcohol use: Never   Drug use: Not on file   Sexual activity: Not on file  Other Topics Concern   Not on file  Social History Narrative   Not on file   Social Determinants of Health   Financial Resource Strain: Low Risk    Difficulty of Paying Living Expenses: Not hard at all  Food Insecurity: No Food Insecurity   Worried About Charity fundraiser in the Last Year: Never true   [place] in the Last Year: Never true  Transportation Needs: No Transportation Needs   Lack of Transportation (Medical): No   Lack of Transportation (Non-Medical): No  Physical Activity: Sufficiently Active   Days of Exercise per Week: 7 days   Minutes of Exercise per Session: 60 min  Stress: No Stress Concern Present   Feeling of Stress : Not at all  Social Connections: Moderately Integrated   Frequency of Communication with Friends and Family: More than three times a week   Frequency of Social Gatherings with Friends and Family: Twice a week   Attends Religious Services: More than 4 times per year   Active Member of [employer] or Organizations: Yes   Attends Music therapist: More than 4 times per year   Marital Status: Divorced    Tobacco Counseling Counseling given: Not Answered   Clinical Intake:  Pre-visit preparation completed: Yes  Pain : 0-10 Pain Score: 9  Pain Type: Chronic pain Pain Location:   (Joints, shoulders, ankles, hips, wrist) Pain Descriptors / Indicators: Aching Pain Frequency: Constant Pain Relieving Factors: Celebrex, Extra Strenth Tylenol, Pain relieving gel  Pain Relieving Factors: Celebrex, Extra Strenth Tylenol, Pain relieving gel  Nutritional Risks: None Diabetes: No  How often do you need to have someone help you when you read instructions, pamphlets, or other written materials from your doctor or pharmacy?: 1 - Never What is the last grade level you completed in school?: College  Diabetic?No  Interpreter Needed?: No  Information entered by :: [place] of Daily Living In your present state of health, do you have any difficulty performing the following activities: 10/07/2019  Hearing? N  Vision? Y  Comment Legally blind in left eye  Difficulty concentrating or  making decisions? Y  Comment has issues with ADD, has some difficulties remembering  Walking or climbing stairs? N  Dressing or bathing? N  Doing errands, shopping? N  Preparing Food and eating ? N  Using the Toilet? N  In the past six months, have you accidently leaked urine? Y  Comment Totally incontinent with bladder  Do you have problems with loss of bowel control? Y  Comment completely incontienent  Managing your Medications? N  Managing your Finances? N  Housekeeping or managing your Housekeeping? N  Some recent data might be hidden    Patient Care Team: Isaac Bliss, Rayford Halsted, MD as PCP - General (Internal Medicine)  Indicate any recent Medical Services you may have received from other than [facility] providers in the past year (date may be approximate).     Assessment:   This is a routine wellness examination for Jackie Jones.  Hearing/Vision screen  Hearing Screening   125Hz  250Hz  500Hz  1000Hz  2000Hz  3000Hz  4000Hz  6000Hz  8000Hz   Right ear:           Left ear:           Vision Screening Comments: Patient states get annual eye exams, has retina issues and is legally  blind in left eye   Dietary issues and exercise activities discussed: Current Exercise Habits: Home exercise routine, Type of exercise: walking, Time (Minutes): 60, Frequency (Times/Week): 7, Weekly Exercise (Minutes/Week): 420, Intensity: Mild, Exercise limited by: orthopedic condition(s)  Goals     Patient Stated     I will continue to walk 4 miles per day       Depression Screen PHQ 2/9 Scores 10/07/2019 10/30/2018 03/01/2018  PHQ - 2 Score 0 0 0  PHQ- 9 Score 0 0 1    Fall Risk Fall Risk  10/07/2019 10/30/2018 03/01/2018  Falls in the past year? 0 0 0  Number falls in past yr: 0 0 0  Injury with Fall? 0 0 0  Risk for fall due to : Impaired vision;Impaired balance/gait;Medication side effect - -  Follow up Falls evaluation completed;Falls prevention discussed - -    Any stairs in or around the home? No  If so, are there any without handrails? No  Home free of loose throw rugs in walkways, pet beds, electrical cords, etc? Yes  Adequate lighting in your home to reduce risk of falls? Yes   ASSISTIVE DEVICES UTILIZED TO PREVENT FALLS:  Life alert? No  Use of a cane, walker or w/c? Yes  uses a cane Grab bars in the bathroom? Yes  Shower chair or bench in shower? No  Elevated toilet seat or a handicapped toilet? No     Cognitive Function:     6CIT Screen 10/07/2019  What Year? 0 points  What month? 0 points  What time? 0 points  Count back from 20 0 points  Months in reverse 0 points  Repeat phrase 0 points  Total Score 0    Immunizations Immunization History  Administered Date(s) Administered   Influenza, High Dose Seasonal PF 11/11/2018   PFIZER SARS-COV-2 Vaccination 04/21/2019, 05/12/2019   Tdap 11/11/2018    TDAP status: Up to date Flu Vaccine status: Up to date Pneumococcal vaccine status: Declined,  Education has been provided regarding the importance of this vaccine but patient still declined. Advised may receive this vaccine at local pharmacy or  Health Dept. Aware to provide a copy of the vaccination record if obtained from local pharmacy or Health Dept. Verbalized acceptance and understanding.  Covid-19 vaccine status: Completed vaccines  Qualifies for Shingles Vaccine? Yes   Zostavax completed No   Shingrix Completed?: No.    Education has been provided regarding the importance of this vaccine. Patient has been advised to call insurance company to determine out of pocket expense if they have not yet received this vaccine. Advised may also receive vaccine at local pharmacy or Health Dept. Verbalized acceptance and understanding.  Screening Tests Health Maintenance  Topic Date Due   Hepatitis C Screening  Never done   DEXA SCAN  10/30/2019 (Originally 06/08/2008)   INFLUENZA VACCINE  10/19/2019   PNA vac Low Risk Adult (2 of 2 - PPSV23) 10/30/2019   TETANUS/TDAP  11/10/2028   COVID-19 Vaccine  Completed    Health Maintenance  Health Maintenance Due  Topic Date Due   Hepatitis C Screening  Never done    Colorectal cancer screening: No longer required.  Mammogram status: Ordered 10/07/2019. Pt provided with contact info and advised to call to schedule appt.  Bone Density status: Ordered 10/07/2019. Pt provided with contact info and advised to call to schedule appt.  Lung Cancer Screening: (Low Dose CT Chest recommended if Age 56-80 years, 30 pack-year currently smoking OR have quit w/in 15years.) does not qualify.   Lung Cancer Screening Referral: N/A  Additional Screening:  Hepatitis C Screening: does qualify;  Vision Screening: Recommended annual ophthalmology exams for early detection of glaucoma and other disorders of the eye. Is the patient up to date with their annual eye exam?  Yes  Who is the provider or what is the name of the office in which the patient attends annual eye exams? [facility],  If pt is not established with a provider, would they like to be referred to a provider to establish  care? No.   Dental Screening: Recommended annual dental exams for proper oral hygiene  Community Resource Referral / Chronic Care Management: CRR required this visit?  No   CCM required this visit?  No      Plan:     I have personally reviewed and noted the following in the patients chart:    Medical and social history  Use of alcohol, tobacco or illicit drugs   Current medications and supplements  Functional ability and status  Nutritional status  Physical activity  Advanced directives  List of other physicians  Hospitalizations, surgeries, and ER visits in previous 12 months  Vitals  Screenings to include cognitive, depression, and falls  Referrals and appointments  In addition, I have reviewed and discussed with patient certain preventive protocols, quality metrics, and best practice recommendations. A written personalized care plan for preventive services as well as general preventive health recommendations were provided to patient.     Ofilia Neas, LPN   04/29/4707   Nurse Notes: None

## 2019-10-07 NOTE — Patient Instructions (Signed)
Jackie Jones , Thank you for taking time to come for your Medicare Wellness Visit. I appreciate your ongoing commitment to your health goals. Please review the following plan we discussed and let me know if I can assist you in the future.   Screening recommendations/referrals: Colonoscopy: No longer required  Mammogram: Ordered on today's visit Bone Density: ordered on today's visit Recommended yearly ophthalmology/optometry visit for glaucoma screening and checkup Recommended yearly dental visit for hygiene and checkup  Vaccinations: Influenza vaccine: Up to date, next due 10/2019 Pneumococcal vaccine: Currently due, will get at next office visit  Tdap vaccine: Up to date, next due 11/11/2018 Shingles vaccine: Currently due, please check with your pharmacy to find out the cost     Advanced directives: Copy on File  Conditions/risks identified: None   Next appointment: None    Preventive Care 65 Years and Older, Female Preventive care refers to lifestyle choices and visits with your health care provider that can promote health and wellness. What does preventive care include?  A yearly physical exam. This is also called an annual well check.  Dental exams once or twice a year.  Routine eye exams. Ask your health care provider how often you should have your eyes checked.  Personal lifestyle choices, including:  Daily care of your teeth and gums.  Regular physical activity.  Eating a healthy diet.  Avoiding tobacco and drug use.  Limiting alcohol use.  Practicing safe sex.  Taking low-dose aspirin every day.  Taking vitamin and mineral supplements as recommended by your health care provider. What happens during an annual well check? The services and screenings done by your health care provider during your annual well check will depend on your age, overall health, lifestyle risk factors, and family history of disease. Counseling  Your health care provider may ask you  questions about your:  Alcohol use.  Tobacco use.  Drug use.  Emotional well-being.  Home and relationship well-being.  Sexual activity.  Eating habits.  History of falls.  Memory and ability to understand (cognition).  Work and work Statistician.  Reproductive health. Screening  You may have the following tests or measurements:  Height, weight, and BMI.  Blood pressure.  Lipid and cholesterol levels. These may be checked every 5 years, or more frequently if you are over 51 years old.  Skin check.  Lung cancer screening. You may have this screening every year starting at age 15 if you have a 30-pack-year history of smoking and currently smoke or have quit within the past 15 years.  Fecal occult blood test (FOBT) of the stool. You may have this test every year starting at age 68.  Flexible sigmoidoscopy or colonoscopy. You may have a sigmoidoscopy every 5 years or a colonoscopy every 10 years starting at age 64.  Hepatitis C blood test.  Hepatitis B blood test.  Sexually transmitted disease (STD) testing.  Diabetes screening. This is done by checking your blood sugar (glucose) after you have not eaten for a while (fasting). You may have this done every 1-3 years.  Bone density scan. This is done to screen for osteoporosis. You may have this done starting at age 39.  Mammogram. This may be done every 1-2 years. Talk to your health care provider about how often you should have regular mammograms. Talk with your health care provider about your test results, treatment options, and if necessary, the need for more tests. Vaccines  Your health care provider may recommend certain vaccines, such as:  Influenza vaccine. This is recommended every year.  Tetanus, diphtheria, and acellular pertussis (Tdap, Td) vaccine. You may need a Td booster every 10 years.  Zoster vaccine. You may need this after age 19.  Pneumococcal 13-valent conjugate (PCV13) vaccine. One dose is  recommended after age 32.  Pneumococcal polysaccharide (PPSV23) vaccine. One dose is recommended after age 51. Talk to your health care provider about which screenings and vaccines you need and how often you need them. This information is not intended to replace advice given to you by your health care provider. Make sure you discuss any questions you have with your health care provider. Document Released: 04/02/2015 Document Revised: 11/24/2015 Document Reviewed: 01/05/2015 Elsevier Interactive Patient Education  2017 [place] Prevention in the Home Falls can cause injuries. They can happen to people of all ages. There are many things you can do to make your home safe and to help prevent falls. What can I do on the outside of my home?  Regularly fix the edges of walkways and driveways and fix any cracks.  Remove anything that might make you trip as you walk through a door, such as a raised step or threshold.  Trim any bushes or trees on the path to your home.  Use bright outdoor lighting.  Clear any walking paths of anything that might make someone trip, such as rocks or tools.  Regularly check to see if handrails are loose or broken. Make sure that both sides of any steps have handrails.  Any raised decks and porches should have guardrails on the edges.  Have any leaves, snow, or ice cleared regularly.  Use sand or salt on walking paths during winter.  Clean up any spills in your garage right away. This includes oil or grease spills. What can I do in the bathroom?  Use night lights.  Install grab bars by the toilet and in the tub and shower. Do not use towel bars as grab bars.  Use non-skid mats or decals in the tub or shower.  If you need to sit down in the shower, use a plastic, non-slip stool.  Keep the floor dry. Clean up any water that spills on the floor as soon as it happens.  Remove soap buildup in the tub or shower regularly.  Attach bath mats  securely with double-sided non-slip rug tape.  Do not have throw rugs and other things on the floor that can make you trip. What can I do in the bedroom?  Use night lights.  Make sure that you have a light by your bed that is easy to reach.  Do not use any sheets or blankets that are too big for your bed. They should not hang down onto the floor.  Have a firm chair that has side arms. You can use this for support while you get dressed.  Do not have throw rugs and other things on the floor that can make you trip. What can I do in the kitchen?  Clean up any spills right away.  Avoid walking on wet floors.  Keep items that you use a lot in easy-to-reach places.  If you need to reach something above you, use a strong step stool that has a grab bar.  Keep electrical cords out of the way.  Do not use floor polish or wax that makes floors slippery. If you must use wax, use non-skid floor wax.  Do not have throw rugs and other things on the floor that  can make you trip. What can I do with my stairs?  Do not leave any items on the stairs.  Make sure that there are handrails on both sides of the stairs and use them. Fix handrails that are broken or loose. Make sure that handrails are as long as the stairways.  Check any carpeting to make sure that it is firmly attached to the stairs. Fix any carpet that is loose or worn.  Avoid having throw rugs at the top or bottom of the stairs. If you do have throw rugs, attach them to the floor with carpet tape.  Make sure that you have a light switch at the top of the stairs and the bottom of the stairs. If you do not have them, ask someone to add them for you. What else can I do to help prevent falls?  Wear shoes that:  Do not have high heels.  Have rubber bottoms.  Are comfortable and fit you well.  Are closed at the toe. Do not wear sandals.  If you use a stepladder:  Make sure that it is fully opened. Do not climb a closed  stepladder.  Make sure that both sides of the stepladder are locked into place.  Ask someone to hold it for you, if possible.  Clearly mark and make sure that you can see:  Any grab bars or handrails.  First and last steps.  Where the edge of each step is.  Use tools that help you move around (mobility aids) if they are needed. These include:  Canes.  Walkers.  Scooters.  Crutches.  Turn on the lights when you go into a dark area. Replace any light bulbs as soon as they burn out.  Set up your furniture so you have a clear path. Avoid moving your furniture around.  If any of your floors are uneven, fix them.  If there are any pets around you, be aware of where they are.  Review your medicines with your doctor. Some medicines can make you feel dizzy. This can increase your chance of falling. Ask your doctor what other things that you can do to help prevent falls. This information is not intended to replace advice given to you by your health care provider. Make sure you discuss any questions you have with your health care provider. Document Released: 12/31/2008 Document Revised: 08/12/2015 Document Reviewed: 04/10/2014 Elsevier Interactive Patient Education  2017 [employer].

## 2019-10-14 ENCOUNTER — Telehealth (INDEPENDENT_AMBULATORY_CARE_PROVIDER_SITE_OTHER): Payer: Medicare Other | Admitting: Internal Medicine

## 2019-10-14 VITALS — BP 124/79

## 2019-10-14 DIAGNOSIS — F319 Bipolar disorder, unspecified: Secondary | ICD-10-CM | POA: Diagnosis not present

## 2019-10-14 DIAGNOSIS — R42 Dizziness and giddiness: Secondary | ICD-10-CM

## 2019-10-14 DIAGNOSIS — D510 Vitamin B12 deficiency anemia due to intrinsic factor deficiency: Secondary | ICD-10-CM | POA: Diagnosis not present

## 2019-10-14 DIAGNOSIS — I10 Essential (primary) hypertension: Secondary | ICD-10-CM

## 2019-10-14 DIAGNOSIS — E038 Other specified hypothyroidism: Secondary | ICD-10-CM | POA: Diagnosis not present

## 2019-10-14 NOTE — Progress Notes (Signed)
Virtual Visit via Video Note  I connected with Jackie Jones on 10/14/19 at  4:00 PM EDT by a video enabled telemedicine application and verified that I am speaking with the correct person using two identifiers.  Location patient: home Location provider: work office Persons participating in the virtual visit: patient, provider  I discussed the limitations of evaluation and management by telemedicine and the availability of in person appointments. The patient expressed understanding and agreed to proceed.   HPI: She has scheduled this visit to discuss some issues.  She states they have been present "all summer long".  She states that in the mornings, about 30 minutes after taking her amlodipine, she gets headache, nausea and feels very tired.  She does not feel depressed, she has a history of bipolar disorder and seasonal affective disorder and is very compliant with her psychiatric follow-ups.  She has several blood pressure metrics to share with me today all of these are from today while she was experiencing the symptoms: 124/79, 120/74, 127/75.   ROS: Constitutional: Denies fever, chills, diaphoresis, appetite change. HEENT: Denies photophobia, eye pain, redness, hearing loss, ear pain, congestion, sore throat, rhinorrhea, sneezing, mouth sores, trouble swallowing, neck pain, neck stiffness and tinnitus.   Respiratory: Denies SOB, DOE, cough, chest tightness,  and wheezing.   Cardiovascular: Denies chest pain, palpitations and leg swelling.  Gastrointestinal: Denies  vomiting, abdominal pain, diarrhea, constipation, blood in stool and abdominal distention.  Genitourinary: Denies dysuria, urgency, frequency, hematuria, flank pain and difficulty urinating.  Endocrine: Denies: hot or cold intolerance, sweats, changes in hair or nails, polyuria, polydipsia. Musculoskeletal: Denies myalgias, back pain, joint swelling, arthralgias and gait problem.  Skin: Denies pallor, rash and  wound.  Neurological: Denies dizziness, seizures, syncope, weakness, light-headedness, numbness. Hematological: Denies adenopathy. Easy bruising, personal or family bleeding history  Psychiatric/Behavioral: Denies suicidal ideation, mood changes, confusion, nervousness, sleep disturbance and agitation   Past Medical History:  Diagnosis Date  . Allergy   . Arthritis   . Bipolar 1 disorder, depressed ([place])   . Chicken pox   . Depression   . History of blood transfusion   . Hx: UTI (urinary tract infection)   . Hypertension   . Hypothyroidism   . Osteoporosis   . Pernicious anemia   . Rheumatic fever   . Thyroid disease     Past Surgical History:  Procedure Laterality Date  . ABDOMINAL HYSTERECTOMY     1981  . APPENDECTOMY  1981  . prolapsed rectum 2015    . TONSILLECTOMY AND ADENOIDECTOMY  1966    Family History  Problem Relation Age of Onset  . Heart disease Mother   . Arthritis Mother   . Cancer Father        bladder  . Thyroid disease Father   . Dementia Father   . Hypertension Sister   . Hypertension Brother   . Hypertension Sister   . Hypertension Sister   . Diabetes Brother     SOCIAL HX:   reports that she has never smoked. She has never used smokeless tobacco. She reports that she does not drink alcohol. No history on file for drug use.   Current Outpatient Medications:  .  acetaminophen (TYLENOL) 500 MG tablet, Take 500 mg by mouth every 6 (six) hours as needed., Disp: , Rfl:  .  alendronate (FOSAMAX) 70 MG tablet, Take 1 tablet (70 mg total) by mouth once a week. Take with a full glass of water on an  empty stomach., Disp: 12 tablet, Rfl: 3 .  amLODipine (NORVASC) 10 MG tablet, TAKE 1 TABLET(10 MG) BY MOUTH DAILY, Disp: 90 tablet, Rfl: 1 .  celecoxib (CELEBREX) 50 MG capsule, TAKE 1 CAPSULE(50 MG) BY MOUTH TWICE DAILY, Disp: 60 capsule, Rfl: 1 .  cyanocobalamin (,VITAMIN B-12,) 1000 MCG/ML injection, Inject 1 mL (1,000 mcg total) into the muscle every 30  (thirty) days., Disp: 30 mL, Rfl: 3 .  divalproex (DEPAKOTE ER) 500 MG 24 hr tablet, Take 1 tablet (500 mg total) by mouth at bedtime., Disp: 30 tablet, Rfl: 0 .  levothyroxine (SYNTHROID) 125 MCG tablet, Take 1 tablet (125 mcg total) by mouth daily., Disp: 90 tablet, Rfl: 1 .  Multiple Vitamins-Minerals (PRESERVISION AREDS) CAPS, Take by mouth., Disp: , Rfl:  .  olmesartan (BENICAR) 20 MG tablet, TAKE 1 TABLET(20 MG) BY MOUTH DAILY, Disp: 90 tablet, Rfl: 1 .  OVER THE COUNTER MEDICATION, AllerClear 10 mg, Disp: , Rfl:  .  Polyethyl Glycol-Propyl Glycol (SYSTANE) 0.4-0.3 % SOLN, Apply to eye., Disp: , Rfl:  .  RESTASIS 0.05 % ophthalmic emulsion, 1 drop 2 (two) times daily., Disp: , Rfl:  .  SYRINGE-NEEDLE, DISP, 3 ML (BD SAFETYGLIDE SYRINGE/NEEDLE) 25G X 1" 3 ML MISC, Inject 67ml in deltoid once monthly, Disp: 100 each, Rfl: 11 .  Venlafaxine HCl 225 MG TB24, Take 1 tablet (225 mg total) by mouth at bedtime., Disp: 30 tablet, Rfl: 0 .  vortioxetine HBr (TRINTELLIX) 20 MG TABS tablet, Take 1 tablet (20 mg total) by mouth daily., Disp: 30 tablet, Rfl: 0  EXAM:   VITALS per patient if applicable: Blood pressure today 120/74  GENERAL: alert, oriented, appears well and in no acute distress  HEENT: atraumatic, conjunttiva clear, no obvious abnormalities on inspection of external nose and ears  NECK: normal movements of the head and neck  LUNGS: on inspection no signs of respiratory distress, breathing rate appears normal, no obvious gross increased work of breathing, gasping or wheezing  CV: no obvious cyanosis  MS: moves all visible extremities without noticeable abnormality  PSYCH/NEURO: pleasant and cooperative, no obvious depression or anxiety, speech and thought processing grossly intact  ASSESSMENT AND PLAN:   Dizzy spells  Bipolar 1 disorder, depressed (HCC)  Hypertension, unspecified type  Pernicious anemia  Other specified hypothyroidism  -Based on her reported blood  pressure data, I do not believe amlodipine is the culprit of her symptoms. -She is due for in person visit.  We can maybe do an EKG at that time as well as blood work to make sure she is not anemic, no vitamin D or B12 deficiency or no hypothyroidism that could explain her symptoms. -If lab work is normal, I would suggest that she reach out to her psychiatrist to see if some of her psychotropic medications could be.     I discussed the assessment and treatment plan with the patient. The patient was provided an opportunity to ask questions and all were answered. The patient agreed with the plan and demonstrated an understanding of the instructions.   The patient was advised to call back or seek an in-person evaluation if the symptoms worsen or if the condition fails to improve as anticipated.    [name], MD  [facility] at [facility]

## 2019-10-16 ENCOUNTER — Ambulatory Visit (INDEPENDENT_AMBULATORY_CARE_PROVIDER_SITE_OTHER): Payer: Medicare Other | Admitting: Psychology

## 2019-10-16 DIAGNOSIS — F3110 Bipolar disorder, current episode manic without psychotic features, unspecified: Secondary | ICD-10-CM

## 2019-10-17 ENCOUNTER — Ambulatory Visit (INDEPENDENT_AMBULATORY_CARE_PROVIDER_SITE_OTHER): Payer: Medicare Other | Admitting: Internal Medicine

## 2019-10-17 ENCOUNTER — Other Ambulatory Visit: Payer: Self-pay | Admitting: Internal Medicine

## 2019-10-17 ENCOUNTER — Other Ambulatory Visit: Payer: Self-pay

## 2019-10-17 ENCOUNTER — Encounter: Payer: Self-pay | Admitting: Internal Medicine

## 2019-10-17 VITALS — BP 130/70 | HR 86 | Temp 98.1°F | Wt 138.3 lb

## 2019-10-17 DIAGNOSIS — B351 Tinea unguium: Secondary | ICD-10-CM

## 2019-10-17 DIAGNOSIS — E559 Vitamin D deficiency, unspecified: Secondary | ICD-10-CM

## 2019-10-17 DIAGNOSIS — D510 Vitamin B12 deficiency anemia due to intrinsic factor deficiency: Secondary | ICD-10-CM | POA: Diagnosis not present

## 2019-10-17 DIAGNOSIS — E038 Other specified hypothyroidism: Secondary | ICD-10-CM

## 2019-10-17 DIAGNOSIS — Z1231 Encounter for screening mammogram for malignant neoplasm of breast: Secondary | ICD-10-CM

## 2019-10-17 DIAGNOSIS — R5383 Other fatigue: Secondary | ICD-10-CM

## 2019-10-17 DIAGNOSIS — H548 Legal blindness, as defined in USA: Secondary | ICD-10-CM

## 2019-10-17 NOTE — Progress Notes (Signed)
Established Patient Office Visit     This visit occurred during the SARS-CoV-2 public health emergency.  Safety protocols were in place, including screening questions prior to the visit, additional usage of staff PPE, and extensive cleaning of exam room while observing appropriate contact time as indicated for disinfecting solutions.    CC/Reason for Visit: Fatigue and dizziness  HPI: Jackie Jones is a 74 y.o. female who is coming in today for the above mentioned reasons.  She continues to have fatigue and dizziness.  She is here today to have blood pressure checked and for labs.  The fatigue and dizziness have been present all summer.  She does have a history of bipolar disorder and is on multiple psychotropic medications.  She keeps good track of her blood pressures at home.  She has never been hypotensive.  She is also requesting a podiatry referral to help with toenail care as she is legally blind and is also requesting a handicap placard for the same reasons.   Past Medical/Surgical History: Past Medical History:  Diagnosis Date  . Allergy   . Arthritis   . Bipolar 1 disorder, depressed ([place])   . Chicken pox   . Depression   . History of blood transfusion   . Hx: UTI (urinary tract infection)   . Hypertension   . Hypothyroidism   . Osteoporosis   . Pernicious anemia   . Rheumatic fever   . Thyroid disease     Past Surgical History:  Procedure Laterality Date  . ABDOMINAL HYSTERECTOMY     1981  . APPENDECTOMY  1981  . prolapsed rectum 2015    . TONSILLECTOMY AND ADENOIDECTOMY  1966    Social History:  reports that she has never smoked. She has never used smokeless tobacco. She reports that she does not drink alcohol. No history on file for drug use.  Allergies: Allergies  Allergen Reactions  . Ciprofloxacin Rash  . Codeine Rash  . Levaquin [Levofloxacin] Rash  . Penicillins Rash    Family History:  Family History  Problem Relation Age of  Onset  . Heart disease Mother   . Arthritis Mother   . Cancer Father        bladder  . Thyroid disease Father   . Dementia Father   . Hypertension Sister   . Hypertension Brother   . Hypertension Sister   . Hypertension Sister   . Diabetes Brother      Current Outpatient Medications:  .  acetaminophen (TYLENOL) 500 MG tablet, Take 500 mg by mouth every 6 (six) hours as needed., Disp: , Rfl:  .  alendronate (FOSAMAX) 70 MG tablet, Take 1 tablet (70 mg total) by mouth once a week. Take with a full glass of water on an empty stomach., Disp: 12 tablet, Rfl: 3 .  amLODipine (NORVASC) 10 MG tablet, TAKE 1 TABLET(10 MG) BY MOUTH DAILY, Disp: 90 tablet, Rfl: 1 .  celecoxib (CELEBREX) 50 MG capsule, TAKE 1 CAPSULE(50 MG) BY MOUTH TWICE DAILY, Disp: 60 capsule, Rfl: 1 .  cyanocobalamin (,VITAMIN B-12,) 1000 MCG/ML injection, Inject 1 mL (1,000 mcg total) into the muscle every 30 (thirty) days., Disp: 30 mL, Rfl: 3 .  divalproex (DEPAKOTE ER) 500 MG 24 hr tablet, Take 1 tablet (500 mg total) by mouth at bedtime., Disp: 30 tablet, Rfl: 0 .  levothyroxine (SYNTHROID) 125 MCG tablet, Take 1 tablet (125 mcg total) by mouth daily., Disp: 90 tablet, Rfl: 1 .  Multiple Vitamins-Minerals (  PRESERVISION AREDS) CAPS, Take by mouth., Disp: , Rfl:  .  olmesartan (BENICAR) 20 MG tablet, TAKE 1 TABLET(20 MG) BY MOUTH DAILY, Disp: 90 tablet, Rfl: 1 .  OVER THE COUNTER MEDICATION, AllerClear 10 mg, Disp: , Rfl:  .  Polyethyl Glycol-Propyl Glycol (SYSTANE) 0.4-0.3 % SOLN, Apply to eye., Disp: , Rfl:  .  RESTASIS 0.05 % ophthalmic emulsion, 1 drop 2 (two) times daily., Disp: , Rfl:  .  SYRINGE-NEEDLE, DISP, 3 ML (BD SAFETYGLIDE SYRINGE/NEEDLE) 25G X 1" 3 ML MISC, Inject 34ml in deltoid once monthly, Disp: 100 each, Rfl: 11 .  Venlafaxine HCl 225 MG TB24, Take 1 tablet (225 mg total) by mouth at bedtime., Disp: 30 tablet, Rfl: 0 .  vortioxetine HBr (TRINTELLIX) 20 MG TABS tablet, Take 1 tablet (20 mg total) by mouth  daily., Disp: 30 tablet, Rfl: 0  Review of Systems:  Constitutional: Denies fever, chills, diaphoresis, appetite change. HEENT: Denies photophobia, eye pain, redness, hearing loss, ear pain, congestion, sore throat, rhinorrhea, sneezing, mouth sores, trouble swallowing, neck pain, neck stiffness and tinnitus.   Respiratory: Denies SOB, DOE, cough, chest tightness,  and wheezing.   Cardiovascular: Denies chest pain, palpitations and leg swelling.  Gastrointestinal: Denies nausea, vomiting, abdominal pain, diarrhea, constipation, blood in stool and abdominal distention.  Genitourinary: Denies dysuria, urgency, frequency, hematuria, flank pain and difficulty urinating.  Endocrine: Denies: hot or cold intolerance, sweats, changes in hair or nails, polyuria, polydipsia. Musculoskeletal: Denies myalgias, back pain, joint swelling, arthralgias and gait problem.  Skin: Denies pallor, rash and wound.  Neurological: Denies  seizures, syncope, weakness,  numbness and headaches.  Hematological: Denies adenopathy. Easy bruising, personal or family bleeding history  Psychiatric/Behavioral: Denies suicidal ideation, mood changes, confusion, nervousness, sleep disturbance and agitation    Physical Exam: Vitals:   10/17/19 0921  BP: (!) 130/70  Pulse: 86  Temp: 98.1 F (36.7 C)  TempSrc: Oral  SpO2: 95%  Weight: 138 lb 4.8 oz (62.7 kg)    Body mass index is 23.01 kg/m.   Constitutional: NAD, calm, comfortable Eyes: PERRL, lids and conjunctivae normal ENMT: Mucous membranes are moist.  Respiratory: clear to auscultation bilaterally, no wheezing, no crackles. Normal respiratory effort. No accessory muscle use.  Cardiovascular: Regular rate and rhythm, no murmurs / rubs / gallops. No extremity edema. 2+ pedal pulses. Neurologic: Grossly intact and nonfocal Psychiatric: Normal judgment and insight. Alert and oriented x 3. Normal mood.    Impression and Plan:  Fungal infection of toenail  -  Plan: Ambulatory referral to Podiatry  Legal blindness -Handicap placard filled today as well as podiatry referral.  Fatigue, unspecified type  - Plan: CBC with Differential/Platelet, Comprehensive metabolic panel, TSH, vitamin D, vitamin B12. -Assuming all of these labs are normal, I wonder if some of her symptoms may be related to her multiple psychotropic medications and have asked her to reach out to her psychiatrist if that is the case.  Other specified hypothyroidism  - Plan: TSH  Pernicious anemia  - Plan: Vitamin B12    Patient Instructions  -Nice seeing you today!!  -Lab work today; will notify you once results are available.       [name], MD [facility] at [facility]

## 2019-10-17 NOTE — Patient Instructions (Signed)
-  Nice seeing you today!!  -Lab work today; will notify you once results are available.

## 2019-10-18 LAB — COMPREHENSIVE METABOLIC PANEL
AG Ratio: 1.7 (calc) (ref 1.0–2.5)
ALT: 13 U/L (ref 6–29)
AST: 19 U/L (ref 10–35)
Albumin: 4.3 g/dL (ref 3.6–5.1)
Alkaline phosphatase (APISO): 60 U/L (ref 37–153)
BUN/Creatinine Ratio: 38 (calc) — ABNORMAL HIGH (ref 6–22)
BUN: 27 mg/dL — ABNORMAL HIGH (ref 7–25)
CO2: 29 mmol/L (ref 20–32)
Calcium: 9.9 mg/dL (ref 8.6–10.4)
Chloride: 100 mmol/L (ref 98–110)
Creat: 0.72 mg/dL (ref 0.60–0.93)
Globulin: 2.6 g/dL (calc) (ref 1.9–3.7)
Glucose, Bld: 95 mg/dL (ref 65–99)
Potassium: 4.7 mmol/L (ref 3.5–5.3)
Sodium: 138 mmol/L (ref 135–146)
Total Bilirubin: 0.4 mg/dL (ref 0.2–1.2)
Total Protein: 6.9 g/dL (ref 6.1–8.1)

## 2019-10-18 LAB — CBC WITH DIFFERENTIAL/PLATELET
Absolute Monocytes: 482 cells/uL (ref 200–950)
Basophils Absolute: 62 cells/uL (ref 0–200)
Basophils Relative: 1.1 %
Eosinophils Absolute: 252 cells/uL (ref 15–500)
Eosinophils Relative: 4.5 %
HCT: 36 % (ref 35.0–45.0)
Hemoglobin: 12.2 g/dL (ref 11.7–15.5)
Lymphs Abs: 1674 cells/uL (ref 850–3900)
MCH: 30 pg (ref 27.0–33.0)
MCHC: 33.9 g/dL (ref 32.0–36.0)
MCV: 88.5 fL (ref 80.0–100.0)
MPV: 9.9 fL (ref 7.5–12.5)
Monocytes Relative: 8.6 %
Neutro Abs: 3130 cells/uL (ref 1500–7800)
Neutrophils Relative %: 55.9 %
Platelets: 255 10*3/uL (ref 140–400)
RBC: 4.07 10*6/uL (ref 3.80–5.10)
RDW: 12.1 % (ref 11.0–15.0)
Total Lymphocyte: 29.9 %
WBC: 5.6 10*3/uL (ref 3.8–10.8)

## 2019-10-18 LAB — VITAMIN B12: Vitamin B-12: 425 pg/mL (ref 200–1100)

## 2019-10-18 LAB — TSH: TSH: 0.73 mIU/L (ref 0.40–4.50)

## 2019-10-18 LAB — VITAMIN D 25 HYDROXY (VIT D DEFICIENCY, FRACTURES): Vit D, 25-Hydroxy: 49 ng/mL (ref 30–100)

## 2019-10-20 DIAGNOSIS — F3131 Bipolar disorder, current episode depressed, mild: Secondary | ICD-10-CM | POA: Diagnosis not present

## 2019-10-24 ENCOUNTER — Telehealth: Payer: Self-pay | Admitting: Internal Medicine

## 2019-10-24 NOTE — Telephone Encounter (Signed)
Please see message.  Please advise. 

## 2019-10-24 NOTE — Telephone Encounter (Signed)
Pt said she wants her lab results and if someone could call her about them.  Pt Spoke with her psychiatrist about her medication and he said to ask Dr Lemmie Evens to order a lab test that would give her the divalproex (DEPAKOTE ER)   levels in th pt system.  Please call pt at [phone]  Please advise

## 2019-10-24 NOTE — Telephone Encounter (Signed)
Ok to order a depakote level.

## 2019-10-27 ENCOUNTER — Other Ambulatory Visit: Payer: Self-pay

## 2019-10-27 DIAGNOSIS — F319 Bipolar disorder, unspecified: Secondary | ICD-10-CM

## 2019-10-27 NOTE — Telephone Encounter (Signed)
I do not see that labs have been resulted yet. Can you contact patient once results are in and schedule lab appointment? Thank you!

## 2019-10-27 NOTE — Telephone Encounter (Signed)
Lab has been ordered for patient.

## 2019-10-29 ENCOUNTER — Other Ambulatory Visit: Payer: Self-pay

## 2019-10-29 ENCOUNTER — Telehealth: Payer: Self-pay | Admitting: Internal Medicine

## 2019-10-29 ENCOUNTER — Other Ambulatory Visit (INDEPENDENT_AMBULATORY_CARE_PROVIDER_SITE_OTHER): Payer: Medicare Other

## 2019-10-29 DIAGNOSIS — F319 Bipolar disorder, unspecified: Secondary | ICD-10-CM | POA: Diagnosis not present

## 2019-10-29 NOTE — Telephone Encounter (Signed)
Patient is requesting results of her last labs she had done.  Patient is also getting labs done today.

## 2019-10-30 LAB — VALPROIC ACID LEVEL: Valproic Acid Lvl: 33.3 mg/L — ABNORMAL LOW (ref 50.0–100.0)

## 2019-10-30 NOTE — Telephone Encounter (Signed)
Spoke with patient and reviewed lab results. 

## 2019-10-30 NOTE — Telephone Encounter (Signed)
Spoke with patient. See lab result note. 

## 2019-10-31 ENCOUNTER — Other Ambulatory Visit: Payer: Self-pay | Admitting: Internal Medicine

## 2019-10-31 DIAGNOSIS — M810 Age-related osteoporosis without current pathological fracture: Secondary | ICD-10-CM

## 2019-11-06 ENCOUNTER — Ambulatory Visit (INDEPENDENT_AMBULATORY_CARE_PROVIDER_SITE_OTHER): Payer: Medicare Other | Admitting: Psychology

## 2019-11-06 DIAGNOSIS — F3110 Bipolar disorder, current episode manic without psychotic features, unspecified: Secondary | ICD-10-CM

## 2019-11-25 ENCOUNTER — Other Ambulatory Visit: Payer: Self-pay | Admitting: Internal Medicine

## 2019-11-27 ENCOUNTER — Ambulatory Visit (INDEPENDENT_AMBULATORY_CARE_PROVIDER_SITE_OTHER): Payer: Medicare Other | Admitting: Psychology

## 2019-11-27 DIAGNOSIS — F332 Major depressive disorder, recurrent severe without psychotic features: Secondary | ICD-10-CM | POA: Diagnosis not present

## 2019-12-06 DIAGNOSIS — Z23 Encounter for immunization: Secondary | ICD-10-CM | POA: Diagnosis not present

## 2019-12-18 ENCOUNTER — Ambulatory Visit (INDEPENDENT_AMBULATORY_CARE_PROVIDER_SITE_OTHER): Payer: Medicare Other | Admitting: Psychology

## 2019-12-18 DIAGNOSIS — F332 Major depressive disorder, recurrent severe without psychotic features: Secondary | ICD-10-CM

## 2019-12-19 DIAGNOSIS — Z23 Encounter for immunization: Secondary | ICD-10-CM | POA: Diagnosis not present

## 2019-12-23 ENCOUNTER — Ambulatory Visit: Payer: Medicare Other

## 2019-12-25 ENCOUNTER — Ambulatory Visit (INDEPENDENT_AMBULATORY_CARE_PROVIDER_SITE_OTHER): Payer: Medicare Other | Admitting: Psychology

## 2019-12-25 DIAGNOSIS — F332 Major depressive disorder, recurrent severe without psychotic features: Secondary | ICD-10-CM

## 2020-01-07 ENCOUNTER — Other Ambulatory Visit: Payer: Self-pay | Admitting: Internal Medicine

## 2020-01-07 MED ORDER — CELECOXIB 50 MG PO CAPS: ORAL_CAPSULE | ORAL | 1 refills | Status: DC

## 2020-01-07 NOTE — Telephone Encounter (Signed)
Pt is calling in stating that she needs a refill on celecoxib (CELEBREX) 50 MG  Pharm:  [facility] on [address].  Pt had an appointment scheduled but decided to cancel b/c she only needs to have a refill and she is not feeling bad.

## 2020-01-08 ENCOUNTER — Ambulatory Visit (INDEPENDENT_AMBULATORY_CARE_PROVIDER_SITE_OTHER): Payer: Medicare Other | Admitting: Psychology

## 2020-01-08 DIAGNOSIS — F332 Major depressive disorder, recurrent severe without psychotic features: Secondary | ICD-10-CM | POA: Diagnosis not present

## 2020-01-08 NOTE — Telephone Encounter (Signed)
Rx sent 

## 2020-01-13 ENCOUNTER — Ambulatory Visit: Payer: Medicare Other | Admitting: Internal Medicine

## 2020-01-19 ENCOUNTER — Ambulatory Visit: Payer: Medicare Other

## 2020-01-19 ENCOUNTER — Other Ambulatory Visit: Payer: Medicare Other

## 2020-01-19 DIAGNOSIS — F3131 Bipolar disorder, current episode depressed, mild: Secondary | ICD-10-CM | POA: Diagnosis not present

## 2020-01-22 ENCOUNTER — Ambulatory Visit (INDEPENDENT_AMBULATORY_CARE_PROVIDER_SITE_OTHER): Payer: Medicare Other | Admitting: Psychology

## 2020-01-22 DIAGNOSIS — F332 Major depressive disorder, recurrent severe without psychotic features: Secondary | ICD-10-CM

## 2020-01-29 ENCOUNTER — Ambulatory Visit: Payer: Medicare Other | Admitting: Psychology

## 2020-01-30 ENCOUNTER — Ambulatory Visit (INDEPENDENT_AMBULATORY_CARE_PROVIDER_SITE_OTHER): Payer: Medicare Other | Admitting: Psychology

## 2020-01-30 DIAGNOSIS — F332 Major depressive disorder, recurrent severe without psychotic features: Secondary | ICD-10-CM

## 2020-02-19 ENCOUNTER — Ambulatory Visit (INDEPENDENT_AMBULATORY_CARE_PROVIDER_SITE_OTHER): Payer: Medicare Other | Admitting: Psychology

## 2020-02-19 DIAGNOSIS — F332 Major depressive disorder, recurrent severe without psychotic features: Secondary | ICD-10-CM | POA: Diagnosis not present

## 2020-02-25 ENCOUNTER — Ambulatory Visit: Payer: Medicare Other | Admitting: Psychology

## 2020-02-26 ENCOUNTER — Telehealth: Payer: Self-pay | Admitting: Internal Medicine

## 2020-02-26 NOTE — Telephone Encounter (Signed)
Patient is calling and wanted to see if provider can put orders to check her blood levels to see if she is taking too much venlafaxine, please advise. CB is [phone]

## 2020-02-27 NOTE — Telephone Encounter (Signed)
We do not check venlafaxine levels. In addition, this med is ordered by her psychiatrist. I would recommend she follow with him for concerns.

## 2020-02-27 NOTE — Telephone Encounter (Signed)
Please see below.

## 2020-02-27 NOTE — Telephone Encounter (Signed)
Attempted to call patient but was unable to reach.

## 2020-03-01 ENCOUNTER — Telehealth: Payer: Self-pay | Admitting: Internal Medicine

## 2020-03-01 ENCOUNTER — Other Ambulatory Visit: Payer: Self-pay | Admitting: Internal Medicine

## 2020-03-01 DIAGNOSIS — D510 Vitamin B12 deficiency anemia due to intrinsic factor deficiency: Secondary | ICD-10-CM

## 2020-03-01 NOTE — Telephone Encounter (Signed)
error 

## 2020-03-02 ENCOUNTER — Other Ambulatory Visit: Payer: Self-pay | Admitting: Internal Medicine

## 2020-03-04 NOTE — Telephone Encounter (Signed)
Patient is aware 

## 2020-03-10 ENCOUNTER — Ambulatory Visit (INDEPENDENT_AMBULATORY_CARE_PROVIDER_SITE_OTHER): Payer: Medicare Other | Admitting: Psychology

## 2020-03-10 DIAGNOSIS — F332 Major depressive disorder, recurrent severe without psychotic features: Secondary | ICD-10-CM

## 2020-03-22 ENCOUNTER — Ambulatory Visit: Payer: Medicare Other | Admitting: Psychology

## 2020-03-22 ENCOUNTER — Other Ambulatory Visit: Payer: Self-pay | Admitting: Internal Medicine

## 2020-03-22 DIAGNOSIS — I10 Essential (primary) hypertension: Secondary | ICD-10-CM

## 2020-03-29 DIAGNOSIS — F3131 Bipolar disorder, current episode depressed, mild: Secondary | ICD-10-CM | POA: Diagnosis not present

## 2020-04-08 ENCOUNTER — Ambulatory Visit: Payer: Medicare Other | Admitting: Psychology

## 2020-04-15 ENCOUNTER — Ambulatory Visit (INDEPENDENT_AMBULATORY_CARE_PROVIDER_SITE_OTHER): Payer: Medicare Other | Admitting: Internal Medicine

## 2020-04-15 ENCOUNTER — Other Ambulatory Visit: Payer: Self-pay

## 2020-04-15 ENCOUNTER — Ambulatory Visit: Payer: Medicare Other | Admitting: Psychology

## 2020-04-15 ENCOUNTER — Encounter: Payer: Self-pay | Admitting: Internal Medicine

## 2020-04-15 VITALS — BP 138/82 | HR 82 | Temp 97.5°F | Ht 65.0 in | Wt 136.0 lb

## 2020-04-15 DIAGNOSIS — E063 Autoimmune thyroiditis: Secondary | ICD-10-CM

## 2020-04-15 DIAGNOSIS — D510 Vitamin B12 deficiency anemia due to intrinsic factor deficiency: Secondary | ICD-10-CM | POA: Diagnosis not present

## 2020-04-15 DIAGNOSIS — M810 Age-related osteoporosis without current pathological fracture: Secondary | ICD-10-CM | POA: Diagnosis not present

## 2020-04-15 DIAGNOSIS — F338 Other recurrent depressive disorders: Secondary | ICD-10-CM | POA: Diagnosis not present

## 2020-04-15 DIAGNOSIS — I10 Essential (primary) hypertension: Secondary | ICD-10-CM

## 2020-04-15 DIAGNOSIS — H3530 Unspecified macular degeneration: Secondary | ICD-10-CM

## 2020-04-15 DIAGNOSIS — R413 Other amnesia: Secondary | ICD-10-CM | POA: Diagnosis not present

## 2020-04-15 DIAGNOSIS — E038 Other specified hypothyroidism: Secondary | ICD-10-CM | POA: Diagnosis not present

## 2020-04-15 DIAGNOSIS — R42 Dizziness and giddiness: Secondary | ICD-10-CM | POA: Diagnosis not present

## 2020-04-15 DIAGNOSIS — F319 Bipolar disorder, unspecified: Secondary | ICD-10-CM

## 2020-04-15 DIAGNOSIS — R000 Tachycardia, unspecified: Secondary | ICD-10-CM

## 2020-04-15 NOTE — Progress Notes (Signed)
Provider:  Rexene Edison. Mariea Clonts, D.O., C.M.D. Location:   [place]   Place of Service:   clinic  Previous PCP: Gayland Curry, DO Patient Care Team: Gayland Curry, DO as PCP - General (Geriatric Medicine)  Extended Emergency Contact Information Primary Emergency Contact: Roth,Linda Address: [address]          [place], [place] [address] Johnnette Litter of [place] Phone: [phone] Mobile Phone: [phone] Relation: Sister  Code Status: DNR reported to me on new patient packet Goals of Care: Advanced Directive information Advanced Directives 04/15/2020  Does Patient Have a Medical Advance Directive? Yes  Type of Advance Directive Living will;Healthcare Power of Attorney  Does patient want to make changes to medical advance directive? No - Patient declined  Copy of [place] in Chart? No - copy requested   Chief Complaint  Patient presents with  . Establish Care    New Patient to Establish Care  . Health Maintenance    Hep C, PNA, Dexa Scan (never been done)     HPI: Patient is a 75 y.o. female seen today to establish with [facility].  Records have been requested from Dr. Isaac Bliss (in epic).  She completed her new patient packet thoroughly.    She is well, but has some troubling symptoms.  Myra Gianotti, her counselor, had a connection with [place].    HTN:  Controlled right now.  She brought some readings--checks several ties per day due to fluctuations.  BP ranges 696-789 systolic.  Does not note correlation b/c bp and symptoms.  She has pulse ranging from 60-105 in past couple of weeks.     Hashimotos thyroiditis:  On levothyroxine.  It runs in her family.  Her sister orders synthroid brand from a pharmacy in [facility].   Lab Results  Component Value Date   TSH 0.73 10/17/2019   Pernicious anemia:  Takes b12 IM monthly.  Sublingual tabs do not work for her.  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  She actually has a lot of chronic nerve damage in her cervical spine that led to incontinence.  When in [place], her physician thought she could be fixed with surgery.  She did not want surgery.  She had the MRIs and all, but does not want surgery.  Grateful they have diapers now.  It's always and leaks out.  Fecal is worse.  Urinary is more a little leakage.  Lives at home alone.  Her sister helps take care of her and brought her today.    Dementia runs in the family (daddy) and she has trouble finding words.  She came here b/c her sister is here to take care of her.    Arthritis:  Neck, arms, wrists, fingers, hips, knees, ankles.   Not in her back.  Uses celebrex.  Says in her family, they metabolize pain killers fast.  Uses deep blu rub b/w celebrex doses.  Takes a walk after her second dose.  She has used tylenol but not effective so she stopped it--had been on 1000mg  bid.  Considering tumeric, cumin, black pepper supplement or glucosamine/chondroitin supplements.    Depression:  Has major depression tied in with her bipolar.  On effexor.    Also has seasonal affective disorder and uses her therapy light each am.  Prefers to walk early in the am to help with getting that sunlight.  Has a peer counselor  also in addition to [facility].  They work with her to develop tool chests.  Able to keep at [place], but it's a constant thing she has to keep up with.  Also on depakote for this.  She has a psychiatrist and her sister thinks he does not listen to her.  She'd been on trintellix and stopped it due to mania--she spoke to strangers and invited them into her home, spent money to buy things for other people.    Gluten sensitivity.   Lactose intolerant.   Reports she struggled with her weight all of her life--did emotional eating.  Her niece is a Leisure centre manager for [employer] and it changed her life.  She reports getting low sugars.  When she was on the plan, she was not to go below 130 lbs.  She does  not feel like eating with the seasonal affective disorder.  She's in maintenance for several years with [employer].  She does not buy their food anymore.  She eats every 3 hrs.  She's not eating the portions.  Wonders if her symptoms are related to low sugars.  She felt dizzy, nauseous, heart racing and like she'd pass out, just like she does when she gets spells she's concerned about.  She likes a plant-based diet but used to eat a lot of cheese, but has to take lactaid.    Legally blind in left eye.  She had cataract surgery in both eyes in [place].  That went fine.  She then had clean-up with a laser in the left eye and the vision was ruined.  She did then find a specialist able to remove scarring.  With her glasses, she can see now.   She no longer drives.  Her hysterectomy was for a fibroid.  Note she's had two hospitalizations for major depression--she had suicidal ideation after a medication change each time.  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    She does not want to pursue mammogram anymore nor colonoscopy.    Dizziness, nausea, heart racing, feeling faint, hot and sweaty.  Every thiry minutes her symptoms happen.  She thinks the "balloons popping" in her XR meds are responsible.  Thinks she's noticing b/c she's older.  Each time, symptoms last 5 mins. Talks about sun stroke she had in [place].  Past Medical History:  Diagnosis Date  . Allergy   . Arthritis   . Bipolar 1 disorder, depressed ([place])   . Bipolar disorder ([place])   . Chicken pox   . Dementia ([place])   . Depression   . Hashimoto's thyroiditis   . History of blood transfusion   . History of bone density study 2019  . History of colonoscopy 2015  . History of CT scan 2019  . History of mammogram 2019  . History of MRI   . History of Papanicolaou smear of cervix   . Hx: UTI (urinary tract infection)   . Hypertension   . Hypothyroidism   . Lactose intolerance   . Legally blind in left eye,  as defined in [place]   . Non-celiac gluten sensitivity   . Osteoporosis   . Pernicious anemia   . Pernicious anemia   . Rheumatic fever   . Thyroid disease   . Urinary and fecal incontinence    Past Surgical History:  Procedure Laterality Date  . ABDOMINAL HYSTERECTOMY     1981  . APPENDECTOMY  1981  . prolapsed rectum 2015    .  TONSILLECTOMY AND ADENOIDECTOMY  1966  . TUBAL LIGATION      Social History   Socioeconomic History  . Marital status: Single    Spouse name: Not on file  . Number of children: Not on file  . Years of education: Not on file  . Highest education level: Not on file  Occupational History  . Not on file  Tobacco Use  . Smoking status: Never Smoker  . Smokeless tobacco: Never Used  Substance and Sexual Activity  . Alcohol use: Never  . Drug use: Never  . Sexual activity: Not on file  Other Topics Concern  . Not on file  Social History Narrative   Tobacco use, amount per day now: None.   Past tobacco use, amount per day: None.   How many years did you use tobacco: None.   Alcohol use (drinks per week): None.   Diet: I eat a low fat diet. Much in vegetables.    Do you drink/eat things with caffeine: Yes.   Marital status:  Divorced                                What year were you married? 1976   Do you live in a house, apartment, assisted living, condo, trailer, etc.? Rent small house.   Is it one or more stories? One.   How many persons live in your home? I live alone.    Do you have pets in your home?( please list) No.   Highest Level of education completed? Masters Degree Plus.   Current or past profession: Pharmacist, hospital.   Do you exercise? Yes.                                  Type and how often? 5 days a week I walk.    Do you have a living will? Yes.   Do you have a DNR form?    Yes                               If not, do you want to discuss one?   Do you have signed POA/HPOA forms?  Yes.                      If so, please bring to you  appointment      Do you have any difficulty bathing or dressing yourself? No.   Do you have any difficulty preparing food or eating? No.   Do you have any difficulty managing your medications? No.   Do you have any difficulty managing your finances? Yes.   Do you have any difficulty affording your medications? No.   Social Determinants of Health   Financial Resource Strain: Low Risk   . Difficulty of Paying Living Expenses: Not hard at all  Food Insecurity: No Food Insecurity  . Worried About Charity fundraiser in the Last Year: Never true  . Ran Out of Food in the Last Year: Never true  Transportation Needs: No Transportation Needs  . Lack of Transportation (Medical): No  . Lack of Transportation (Non-Medical): No  Physical Activity: Sufficiently Active  . Days of Exercise per Week: 7 days  . Minutes of Exercise per Session: 60 min  Stress: No Stress Concern Present  .  Feeling of Stress : Not at all  Social Connections: Moderately Integrated  . Frequency of Communication with Friends and Family: More than three times a week  . Frequency of Social Gatherings with Friends and Family: Twice a week  . Attends Religious Services: More than 4 times per year  . Active Member of Clubs or Organizations: Yes  . Attends Archivist Meetings: More than 4 times per year  . Marital Status: Divorced    reports that she has never smoked. She has never used smokeless tobacco. She reports that she does not drink alcohol and does not use drugs.  Functional Status Survey:    Family History  Problem Relation Age of Onset  . Heart disease Mother   . Arthritis Mother   . Cancer Father        bladder  . Thyroid disease Father   . Dementia Father   . Hypertension Sister   . Hypertension Brother   . Hypertension Sister   . Hypertension Sister   . Diabetes Brother   . Diabetes type II Son     Health Maintenance  Topic Date Due  . Hepatitis C Screening  Never done  . DEXA SCAN   Never done  . PNA vac Low Risk Adult (2 of 2 - PPSV23) 10/30/2019  . TETANUS/TDAP  11/10/2028  . INFLUENZA VACCINE  Completed  . COVID-19 Vaccine  Completed    Allergies  Allergen Reactions  . Trintellix [Vortioxetine] Other (See Comments)    Led to mania  . Dairycare [Lactase-Lactobacillus]   . Ciprofloxacin Rash  . Codeine Rash  . Levaquin [Levofloxacin] Rash  . Penicillins Rash    Outpatient Encounter Medications as of 04/15/2020  Medication Sig  . alendronate (FOSAMAX) 70 MG tablet TAKE 1 TABLET(70 MG) BY MOUTH 1 TIME A WEEK WITH A FULL GLASS OF WATER AND ON AN EMPTY STOMACH  . amLODipine (NORVASC) 10 MG tablet TAKE 1 TABLET(10 MG) BY MOUTH DAILY  . celecoxib (CELEBREX) 50 MG capsule Take 50 mg by mouth 2 (two) times daily.  . cyanocobalamin (,VITAMIN B-12,) 1000 MCG/ML injection INJECT 1 ML INTO THE MUSCLE EVERY 30 DAYS  . divalproex (DEPAKOTE ER) 500 MG 24 hr tablet Take 1 tablet (500 mg total) by mouth at bedtime.  Marland Kitchen levothyroxine (SYNTHROID) 125 MCG tablet TAKE 1 TABLET(125 MCG) BY MOUTH DAILY  . Multiple Vitamins-Minerals (PRESERVISION AREDS) CAPS Take by mouth in the morning and at bedtime.  Marland Kitchen olmesartan (BENICAR) 20 MG tablet TAKE 1 TABLET(20 MG) BY MOUTH DAILY  . OVER THE COUNTER MEDICATION AllerClear 10 mg  . OVER THE COUNTER MEDICATION Apply 1 application topically as needed. DEEP BLUE SALVE  . Polyethyl Glycol-Propyl Glycol 0.4-0.3 % SOLN Apply to eye.  Marland Kitchen RESTASIS 0.05 % ophthalmic emulsion 1 drop 2 (two) times daily.  . SYRINGE-NEEDLE, DISP, 3 ML (BD SAFETYGLIDE SYRINGE/NEEDLE) 25G X 1" 3 ML MISC Inject 52ml in deltoid once monthly  . venlafaxine XR (EFFEXOR-XR) 150 MG 24 hr capsule Take 150 mg by mouth daily. Along with 75 mg  . venlafaxine XR (EFFEXOR-XR) 75 MG 24 hr capsule Take 75 mg by mouth daily at 6 (six) AM. Along with 150 mg  . [DISCONTINUED] acetaminophen (TYLENOL) 500 MG tablet Take 500 mg by mouth every 6 (six) hours as needed.  . [DISCONTINUED]  Venlafaxine HCl 225 MG TB24 Take 1 tablet (225 mg total) by mouth at bedtime.  . [DISCONTINUED] vortioxetine HBr (TRINTELLIX) 20 MG TABS tablet Take 1 tablet (20  mg total) by mouth daily.   No facility-administered encounter medications on file as of 04/15/2020.    Review of Systems  Constitutional: Positive for malaise/fatigue.       Sweats  HENT: Negative for congestion and sore throat.        Allergies, dry mouth  Eyes:       Glasses, dry eyes  Respiratory: Negative for cough and shortness of breath.   Cardiovascular: Positive for palpitations. Negative for chest pain, orthopnea, leg swelling and PND.       HTN  Gastrointestinal: Positive for abdominal pain and nausea. Negative for blood in stool, constipation, diarrhea and melena.       Incontinence  Genitourinary:       Incontinence  Musculoskeletal: Negative for falls and joint pain.  Neurological: Positive for dizziness, weakness and headaches.       H/o TIA  Endo/Heme/Allergies: Positive for environmental allergies.       Thyroid disorder  Psychiatric/Behavioral: Positive for depression and memory loss. The patient is nervous/anxious.        Mood swings, prior psych admissions for suicidal ideation    Vitals:   04/15/20 1332  BP: 138/82  Pulse: 82  Temp: (!) 97.5 F (36.4 C)  TempSrc: Temporal  SpO2: 98%  Weight: 136 lb (61.7 kg)  Height: 5\' 5"  (1.651 m)   Body mass index is 22.63 kg/m. Physical Exam  Labs reviewed: Basic Metabolic Panel: Recent Labs    06/05/19 1322 10/17/19 0957  NA 131* 138  K 4.1 4.7  CL 95* 100  CO2 28 29  GLUCOSE 90 95  BUN 32* 27*  CREATININE 0.70 0.72  CALCIUM 9.6 9.9   Liver Function Tests: Recent Labs    10/17/19 0957  AST 19  ALT 13  BILITOT 0.4  PROT 6.9   No results for input(s): LIPASE, AMYLASE in the last 8760 hours. No results for input(s): AMMONIA in the last 8760 hours. CBC: Recent Labs    06/05/19 1322 10/17/19 0957  WBC 7.3 5.6  NEUTROABS 4.0 3,130   HGB 12.2 12.2  HCT 36.1 36.0  MCV 91.3 88.5  PLT 279.0 255   Cardiac Enzymes: No results for input(s): CKTOTAL, CKMB, CKMBINDEX, TROPONINI in the last 8760 hours. BNP: Invalid input(s): POCBNP No results found for: HGBA1C Lab Results  Component Value Date   TSH 0.73 10/17/2019   Lab Results  Component Value Date   VITAMINB12 425 10/17/2019   No results found for: FOLATE No results found for: IRON, TIBC, FERRITIN  Imaging and Procedures noted on new patient packet: 2015 cscope 2015 pap 2019 bone density 2019 mammo 2019 CT and MRI brain  Assessment/Plan 1. Dizziness - I'm concerned this may be cardiac arrhythmia with her palpitations reported and these spells of dizziness, nausea, hot flashes--seems most important to rule out  - Ambulatory referral to Cardiology - EKG 12-Lead was NSR at 85bpm in clinic while having the dizziness   2. Racing heart beat -suspect afib or other cardiac arrhythmia and has not worn a monitor to evaluate this - Ambulatory referral to Cardiology - EKG 12-Lead today NSR with right atrial enlargement, odd baseline in lead II  3. Hypertension, unspecified type -bp fair control based on today's reading, but guidelines technically for less than 130/80--given above, I'm not changing this today  4. Hypothyroidism due to Hashimoto's thyroiditis -cont current levothyroxine and monitor Lab Results  Component Value Date   TSH 0.73 10/17/2019   5. Pernicious anemia -last b12  was over 400 in July which is reasonable--continue monthly injections  6. Age-related osteoporosis without current pathological fracture -cont weekly fosamax, but I did not note that she was not on ca or D3 during the visit--will need to f/u on this next time  7. Macular degeneration, unspecified laterality, unspecified type -cont to follow with retina specialist, use preservision areds  8. Bipolar 1 disorder, depressed ([place]) -cont counseling and current regimen with  depakote (last level nontoxic in august), effexor XR  9. Seasonal affective disorder ([place]) -cont use of UV light machine, exercise and counseling to help through the winter  10. Memory loss -check MMSE next time, but seems mild based on our interactions today--new patient packet reads dementia  Labs/tests ordered:   Orders Placed This Encounter  Procedures  . Ambulatory referral to Cardiology    Referral Priority:   Routine    Referral Type:   Consultation    Referral Reason:   Specialty Services Required    Requested Specialty:   Cardiology    Number of Visits Requested:   1  . EKG 12-Lead   F/u in 6 wks with Amy, NP after cardiology appt  Essance Gatti L. Sophy Mesler, D.O. [facility] [address], [place] [address] Cell Phone (Mon-Fri 8am-5pm):  [phone] On Call:  [phone] & follow prompts after 5pm & weekends Office Phone:  [phone] Office Fax:  [phone]

## 2020-04-15 NOTE — Progress Notes (Signed)
EKG appears sinus rhythm.  No afib showing up now.  I've put in the referral to cardiology.

## 2020-04-22 DIAGNOSIS — F338 Other recurrent depressive disorders: Secondary | ICD-10-CM | POA: Insufficient documentation

## 2020-04-26 ENCOUNTER — Ambulatory Visit: Payer: Medicare Other | Admitting: Internal Medicine

## 2020-05-05 ENCOUNTER — Other Ambulatory Visit: Payer: Self-pay

## 2020-05-05 ENCOUNTER — Ambulatory Visit (INDEPENDENT_AMBULATORY_CARE_PROVIDER_SITE_OTHER): Payer: Medicare Other | Admitting: Internal Medicine

## 2020-05-05 ENCOUNTER — Encounter: Payer: Self-pay | Admitting: Internal Medicine

## 2020-05-05 VITALS — BP 136/72 | HR 96 | Ht 65.0 in | Wt 135.0 lb

## 2020-05-05 DIAGNOSIS — I209 Angina pectoris, unspecified: Secondary | ICD-10-CM | POA: Insufficient documentation

## 2020-05-05 DIAGNOSIS — R55 Syncope and collapse: Secondary | ICD-10-CM | POA: Insufficient documentation

## 2020-05-05 DIAGNOSIS — I10 Essential (primary) hypertension: Secondary | ICD-10-CM | POA: Diagnosis not present

## 2020-05-05 DIAGNOSIS — I200 Unstable angina: Secondary | ICD-10-CM | POA: Insufficient documentation

## 2020-05-05 NOTE — Progress Notes (Signed)
Cardiology Office Note:    Date:  05/05/2020   ID:  Jackie Jones, DOB 1945/07/13, MRN [id number]  PCP:  Reed, Tiffany L, [place]  Cardiologist:  No primary care provider on file.  Advanced Practice Provider:  No care team member to display Electrophysiologist:  None       CC: DOE Consulted for the evaluation of palpitations at the behest of Reed, [place], DO  History of Present Illness:    Jackie Jones is a 75 y.o. female with a hx of HTN, Dementia NOS (reasonably functional, does ADLs, but has some mobility issues and uses cane since the summer per sister, hypothyroidism who presents for evaluation 05/05/20.  Patient notes that she is feeling DOE. Patient notes that with exertion, she has breathing issues with exertion..  Patient notes that with exertion she has chest pain that radiates to her arm, jaw, and shoulder.    Discomfort with exertion feels nausea, dizzy and like you are going to faint., worsens with activity, and improves with rest. Also has resting pain with milder symtpoms.  Patient exertion notable for walking and feels chest pain.  No shortness of breath at rest.  No PND or orthopnea.  No bendopnea, weight gain, leg swelling , or abdominal swelling.    Also notes heart racing; this occurs every day.  Patient reports NO prior cardiac testing including  echo,  stress test,  heart catheterizations,  cardioversion,  Ablations.  Amb BP Log Reviewed:  04/30/20:  04/28/20 BP 124/11 heart rate 105; no heart rates above 109.  BP average 135/90  Past Medical History:  Diagnosis Date  . Allergy   . Arthritis   . Bipolar 1 disorder, depressed ([place])   . Bipolar disorder ([place])   . Chicken pox   . Dementia ([place])   . Depression   . Hashimoto's thyroiditis   . History of blood transfusion   . History of bone density study 2019  . History of colonoscopy 2015  . History of CT scan 2019  . History of mammogram 2019  . History  of MRI   . History of Papanicolaou smear of cervix   . Hx: UTI (urinary tract infection)   . Hypertension   . Hypothyroidism   . Lactose intolerance   . Legally blind in left eye, as defined in [place]   . Non-celiac gluten sensitivity   . Osteoporosis   . Pernicious anemia   . Pernicious anemia   . Rheumatic fever   . Thyroid disease   . Urinary and fecal incontinence     Past Surgical History:  Procedure Laterality Date  . ABDOMINAL HYSTERECTOMY     1981  . APPENDECTOMY  1981  . prolapsed rectum 2015    . TONSILLECTOMY AND ADENOIDECTOMY  1966  . TUBAL LIGATION      Current Medications: Current Meds  Medication Sig  . alendronate (FOSAMAX) 70 MG tablet TAKE 1 TABLET(70 MG) BY MOUTH 1 TIME A WEEK WITH A FULL GLASS OF WATER AND ON AN EMPTY STOMACH  . amLODipine (NORVASC) 10 MG tablet TAKE 1 TABLET(10 MG) BY MOUTH DAILY  . celecoxib (CELEBREX) 50 MG capsule Take 50 mg by mouth 2 (two) times daily.  . cyanocobalamin (,VITAMIN B-12,) 1000 MCG/ML injection INJECT 1 ML INTO THE MUSCLE EVERY 30 DAYS  . divalproex (DEPAKOTE ER) 500 MG 24 hr tablet Take 1 tablet (500 mg total) by mouth at bedtime.  Marland Kitchen levothyroxine (SYNTHROID)  125 MCG tablet TAKE 1 TABLET(125 MCG) BY MOUTH DAILY  . Multiple Vitamins-Minerals (PRESERVISION AREDS) CAPS Take by mouth in the morning and at bedtime.  Marland Kitchen olmesartan (BENICAR) 20 MG tablet TAKE 1 TABLET(20 MG) BY MOUTH DAILY  . OVER THE COUNTER MEDICATION AllerClear 10 mg  . OVER THE COUNTER MEDICATION Apply 1 application topically as needed. DEEP BLUE SALVE  . Polyethyl Glycol-Propyl Glycol 0.4-0.3 % SOLN Apply to eye.  Marland Kitchen RESTASIS 0.05 % ophthalmic emulsion 1 drop 2 (two) times daily.  . SYRINGE-NEEDLE, DISP, 3 ML (BD SAFETYGLIDE SYRINGE/NEEDLE) 25G X 1" 3 ML MISC Inject 34ml in deltoid once monthly  . venlafaxine XR (EFFEXOR-XR) 150 MG 24 hr capsule Take 150 mg by mouth daily. Along with 75 mg  . venlafaxine XR (EFFEXOR-XR) 75 MG 24 hr capsule Take 75 mg by  mouth daily at 6 (six) AM. Along with 150 mg     Allergies:   Trintellix [vortioxetine], Dairycare [lactase-lactobacillus], Ciprofloxacin, Codeine, Levaquin [levofloxacin], and Penicillins   Social History   Socioeconomic History  . Marital status: Single    Spouse name: Not on file  . Number of children: Not on file  . Years of education: Not on file  . Highest education level: Not on file  Occupational History  . Not on file  Tobacco Use  . Smoking status: Never Smoker  . Smokeless tobacco: Never Used  Substance and Sexual Activity  . Alcohol use: Never  . Drug use: Never  . Sexual activity: Not on file  Other Topics Concern  . Not on file  Social History Narrative   Tobacco use, amount per day now: None.   Past tobacco use, amount per day: None.   How many years did you use tobacco: None.   Alcohol use (drinks per week): None.   Diet: I eat a low fat diet. Much in vegetables.    Do you drink/eat things with caffeine: Yes.   Marital status:  Divorced                                What year were you married? 1976   Do you live in a house, apartment, assisted living, condo, trailer, etc.? Rent small house.   Is it one or more stories? One.   How many persons live in your home? I live alone.    Do you have pets in your home?( please list) No.   Highest Level of education completed? Masters Degree Plus.   Current or past profession: Pharmacist, hospital.   Do you exercise? Yes.                                  Type and how often? 5 days a week I walk.    Do you have a living will? Yes.   Do you have a DNR form?    Yes                               If not, do you want to discuss one?   Do you have signed POA/HPOA forms?  Yes.                      If so, please bring to you appointment      Do you have any difficulty  bathing or dressing yourself? No.   Do you have any difficulty preparing food or eating? No.   Do you have any difficulty managing your medications? No.   Do you have any  difficulty managing your finances? Yes.   Do you have any difficulty affording your medications? No.   Social Determinants of Health   Financial Resource Strain: Low Risk   . Difficulty of Paying Living Expenses: Not hard at all  Food Insecurity: No Food Insecurity  . Worried About Charity fundraiser in the Last Year: Never true  . Ran Out of Food in the Last Year: Never true  Transportation Needs: No Transportation Needs  . Lack of Transportation (Medical): No  . Lack of Transportation (Non-Medical): No  Physical Activity: Sufficiently Active  . Days of Exercise per Week: 7 days  . Minutes of Exercise per Session: 60 min  Stress: No Stress Concern Present  . Feeling of Stress : Not at all  Social Connections: Moderately Integrated  . Frequency of Communication with Friends and Family: More than three times a week  . Frequency of Social Gatherings with Friends and Family: Twice a week  . Attends Religious Services: More than 4 times per year  . Active Member of Clubs or Organizations: Yes  . Attends Archivist Meetings: More than 4 times per year  . Marital Status: Divorced     Family History: The patient's family history includes Arthritis in her mother; Cancer in her father; Dementia in her father; Diabetes in her brother; Diabetes type II in her son; Heart disease in her mother; Hypertension in her brother, sister, sister, and sister; Thyroid disease in her father. History of coronary artery disease notable for multiple members. History of heart failure notable for multiple members.  ROS:   Please see the history of present illness.    Notes occasional feeling hot for 30 minutes.  All other systems reviewed and are negative.  EKGs/Labs/Other Studies Reviewed:    The following studies were reviewed today:  EKG:   05/05/20: SR 95 non-specific TWI 04/15/20: SR 87 nonspecific TWI with baseline artifact  Recent Labs: 10/17/2019: ALT 13; BUN 27; Creat 0.72;  Hemoglobin 12.2; Platelets 255; Potassium 4.7; Sodium 138; TSH 0.73  Recent Lipid Panel    Component Value Date/Time   CHOL 165 09/25/2018 0825   TRIG 56.0 09/25/2018 0825   HDL 74.50 09/25/2018 0825   CHOLHDL 2 09/25/2018 0825   VLDL 11.2 09/25/2018 0825   LDLCALC 79 09/25/2018 0825   Risk Assessment/Calculations:     The 10-year ASCVD risk score Mikey Bussing DC Brooke Bonito., et al., 2013) is: 26%   Values used to calculate the score:     Age: 89 years     Sex: Female     Is Non-Hispanic African American: No     Diabetic: No     Tobacco smoker: No     Systolic Blood Pressure: 176 mmHg     Is BP treated: Yes     HDL Cholesterol: 74.5 mg/dL     Total Cholesterol: 165 mg/dL   Physical Exam:    VS:  BP 136/72   Pulse 96   Ht 5\' 5"  (1.651 m)   Wt 135 lb (61.2 kg)   SpO2 99%   BMI 22.47 kg/m     Wt Readings from Last 3 Encounters:  05/05/20 135 lb (61.2 kg)  04/15/20 136 lb (61.7 kg)  10/17/19 138 lb 4.8 oz (62.7 kg)  GEN:  Well nourished, well developed in no acute distress HEENT: Normal NECK: No JVD; No carotid bruits LYMPHATICS: No lymphadenopathy CARDIAC: RRR, no murmurs, rubs, gallops; R radial +2 RESPIRATORY:  Clear to auscultation without rales, wheezing or rhonchi  ABDOMEN: Soft, non-tender, non-distended MUSCULOSKELETAL:  No edema; No deformity  SKIN: Warm and dry NEUROLOGIC:  Alert and oriented x 3 PSYCHIATRIC:  Normal affect   ASSESSMENT:    1. Unstable angina ([place])   2. Angina pectoris ([place])   3. Primary hypertension   4. Near syncope    PLAN:    In order of problems listed above:  Angina Pectoris (unstable) - The patient presents with cardiac chest pain with anginal pain with DOE and near syncope and escalation of her symptoms - ASCVD risk estimated at 26% - Discussed risks and benefits of cardiac catheterization have been discussed with the patient.  These include bleeding, infection, kidney damage, stroke, heart attack, death.  The patient understands  these risks and is willing to proceed if necessary - discussed red flag symptoms requiring ED or urgent visit; patient brings her sister who is taking notes  Post cath follow up unless new symptoms or abnormal test results warranting change in plan  Would be reasonable for  APP Follow up   Shared Decision Making/Informed Consent The risks [stroke (1 in 1000), death (1 in 1000), kidney failure [usually temporary] (1 in 500), bleeding (1 in 200), allergic reaction [possibly serious] (1 in 200)], benefits (diagnostic support and management of coronary artery disease) and alternatives of a cardiac catheterization were discussed in detail with Jackie Jones and she is willing to proceed.        Medication Adjustments/Labs and Tests Ordered: Current medicines are reviewed at length with the patient today.  Concerns regarding medicines are outlined above.  Orders Placed This Encounter  Procedures  . Basic metabolic panel  . CBC  . EKG 12-Lead   No orders of the defined types were placed in this encounter.   Patient Instructions  Medication Instructions:  Your physician recommends that you continue on your current medications as directed. Please refer to the Current Medication list given to you today.  *If you need a refill on your cardiac medications before your next appointment, please call your pharmacy*   Lab Work: TODAY: CBC and BMET If you have labs (blood work) drawn today and your tests are completely normal, you will receive your results only by: Marland Kitchen MyChart Message (if you have MyChart) OR . A paper copy in the mail If you have any lab test that is abnormal or we need to change your treatment, we will call you to review the results.   Testing/Procedures: Your physician has requested that you have a cardiac catheterization. Cardiac catheterization is used to diagnose and/or treat various heart conditions. Doctors may recommend this procedure for a number of different reasons.  The most common reason is to evaluate chest pain. Chest pain can be a symptom of coronary artery disease (CAD), and cardiac catheterization can show whether plaque is narrowing or blocking your heart's arteries. This procedure is also used to evaluate the valves, as well as measure the blood flow and oxygen levels in different parts of your heart. Please follow instruction sheet, as given.        COVID TEST--Friday 05/07/20 at 10:50am  -- You will go to [address] [place], [place] [address]  for your Covid testing.   This is a drive thru test site.  Be sure to share with the first checkpoint that you are there for pre-procedure/surgery testing. Stay in your car and the nurse team will come to your car to test you.  After you are tested please go home and self-quarantine until the day of your procedure on Monday 05/10/2020.         Follow-Up: At [facility], you and your health needs are our priority.  As part of our continuing mission to provide you with exceptional heart care, we have created designated Provider Care Teams.  These Care Teams include your primary Cardiologist (physician) and Advanced Practice Providers (APPs -  Physician Assistants and Nurse Practitioners) who all work together to provide you with the care you need, when you need it.  We recommend signing up for the patient portal called "MyChart".  Sign up information is provided on this After Visit Summary.  MyChart is used to connect with patients for Virtual Visits (Telemedicine).  Patients are able to view lab/test results, encounter notes, upcoming appointments, etc.  Non-urgent messages can be sent to your provider as well.   To learn more about what you can do with MyChart, go to [email or web address].    Your next appointment:   2 week(s)  The format for your next appointment:   In Person  Provider:   You may see Gasper Sells, MD or one of the following Advanced Practice Providers on your designated Care  Team:    Melina Copa, PA-C  Ermalinda Barrios, PA-C    Other Instructions     [facility] OFFICE [place], [place] [address] Dept: [place]: [phone]  Joliana Claflin  05/05/2020  You are scheduled for a Cardiac Catheterization on Monday, February 21 with Dr. Harrell Gave End.  1. Please arrive at the [facility] (Main Entrance A) at [facility]: [place], [place] [address] at 8:30 AM (This time is two hours before your procedure to ensure your preparation). Free valet parking service is available.   Special note: Every effort is made to have your procedure done on time. Please understand that emergencies sometimes delay scheduled procedures.  2. Diet: Do not eat solid foods after midnight.  The patient may have clear liquids until 5am upon the day of the procedure.  3. Labs: TODAY  4. Medication instructions in preparation for your procedure:   On the morning of your procedure, take your Aspirin 81mg  and any morning medicines NOT listed above.  You may use sips of water.  5. Plan for one night stay--bring personal belongings. 6. Bring a current list of your medications and current insurance cards. 7. You MUST have a responsible person to drive you home. 8. Someone MUST be with you the first 24 hours after you arrive home or your discharge will be delayed. 9. Please wear clothes that are easy to get on and off and wear slip-on shoes.  Thank you for allowing [place] to care for you!   -- [facility] Invasive Cardiovascular services      Signed, Werner Lean, MD  05/05/2020 3:01 PM    [place]

## 2020-05-05 NOTE — H&P (View-Only) (Signed)
Cardiology Office Note:    Date:  05/05/2020   ID:  Jackie Jones, DOB 1945/11/20, MRN [id number]  PCP:  Reed, Tiffany L, [place]  Cardiologist:  No primary care provider on file.  Advanced Practice Provider:  No care team member to display Electrophysiologist:  None       CC: DOE Consulted for the evaluation of palpitations at the behest of Reed, [place], DO  History of Present Illness:    Jackie Jones is a 75 y.o. female with a hx of HTN, Dementia NOS (reasonably functional, does ADLs, but has some mobility issues and uses cane since the summer per sister, hypothyroidism who presents for evaluation 05/05/20.  Patient notes that she is feeling DOE. Patient notes that with exertion, she has breathing issues with exertion..  Patient notes that with exertion she has chest pain that radiates to her arm, jaw, and shoulder.    Discomfort with exertion feels nausea, dizzy and like you are going to faint., worsens with activity, and improves with rest. Also has resting pain with milder symtpoms.  Patient exertion notable for walking and feels chest pain.  No shortness of breath at rest.  No PND or orthopnea.  No bendopnea, weight gain, leg swelling , or abdominal swelling.    Also notes heart racing; this occurs every day.  Patient reports NO prior cardiac testing including  echo,  stress test,  heart catheterizations,  cardioversion,  Ablations.  Amb BP Log Reviewed:  04/30/20:  04/28/20 BP 124/11 heart rate 105; no heart rates above 109.  BP average 135/90  Past Medical History:  Diagnosis Date  . Allergy   . Arthritis   . Bipolar 1 disorder, depressed ([place])   . Bipolar disorder ([place])   . Chicken pox   . Dementia ([place])   . Depression   . Hashimoto's thyroiditis   . History of blood transfusion   . History of bone density study 2019  . History of colonoscopy 2015  . History of CT scan 2019  . History of mammogram 2019  . History  of MRI   . History of Papanicolaou smear of cervix   . Hx: UTI (urinary tract infection)   . Hypertension   . Hypothyroidism   . Lactose intolerance   . Legally blind in left eye, as defined in [place]   . Non-celiac gluten sensitivity   . Osteoporosis   . Pernicious anemia   . Pernicious anemia   . Rheumatic fever   . Thyroid disease   . Urinary and fecal incontinence     Past Surgical History:  Procedure Laterality Date  . ABDOMINAL HYSTERECTOMY     1981  . APPENDECTOMY  1981  . prolapsed rectum 2015    . TONSILLECTOMY AND ADENOIDECTOMY  1966  . TUBAL LIGATION      Current Medications: Current Meds  Medication Sig  . alendronate (FOSAMAX) 70 MG tablet TAKE 1 TABLET(70 MG) BY MOUTH 1 TIME A WEEK WITH A FULL GLASS OF WATER AND ON AN EMPTY STOMACH  . amLODipine (NORVASC) 10 MG tablet TAKE 1 TABLET(10 MG) BY MOUTH DAILY  . celecoxib (CELEBREX) 50 MG capsule Take 50 mg by mouth 2 (two) times daily.  . cyanocobalamin (,VITAMIN B-12,) 1000 MCG/ML injection INJECT 1 ML INTO THE MUSCLE EVERY 30 DAYS  . divalproex (DEPAKOTE ER) 500 MG 24 hr tablet Take 1 tablet (500 mg total) by mouth at bedtime.  Marland Kitchen levothyroxine (SYNTHROID)  125 MCG tablet TAKE 1 TABLET(125 MCG) BY MOUTH DAILY  . Multiple Vitamins-Minerals (PRESERVISION AREDS) CAPS Take by mouth in the morning and at bedtime.  Marland Kitchen olmesartan (BENICAR) 20 MG tablet TAKE 1 TABLET(20 MG) BY MOUTH DAILY  . OVER THE COUNTER MEDICATION AllerClear 10 mg  . OVER THE COUNTER MEDICATION Apply 1 application topically as needed. DEEP BLUE SALVE  . Polyethyl Glycol-Propyl Glycol 0.4-0.3 % SOLN Apply to eye.  Marland Kitchen RESTASIS 0.05 % ophthalmic emulsion 1 drop 2 (two) times daily.  . SYRINGE-NEEDLE, DISP, 3 ML (BD SAFETYGLIDE SYRINGE/NEEDLE) 25G X 1" 3 ML MISC Inject 8ml in deltoid once monthly  . venlafaxine XR (EFFEXOR-XR) 150 MG 24 hr capsule Take 150 mg by mouth daily. Along with 75 mg  . venlafaxine XR (EFFEXOR-XR) 75 MG 24 hr capsule Take 75 mg by  mouth daily at 6 (six) AM. Along with 150 mg     Allergies:   Trintellix [vortioxetine], Dairycare [lactase-lactobacillus], Ciprofloxacin, Codeine, Levaquin [levofloxacin], and Penicillins   Social History   Socioeconomic History  . Marital status: Single    Spouse name: Not on file  . Number of children: Not on file  . Years of education: Not on file  . Highest education level: Not on file  Occupational History  . Not on file  Tobacco Use  . Smoking status: Never Smoker  . Smokeless tobacco: Never Used  Substance and Sexual Activity  . Alcohol use: Never  . Drug use: Never  . Sexual activity: Not on file  Other Topics Concern  . Not on file  Social History Narrative   Tobacco use, amount per day now: None.   Past tobacco use, amount per day: None.   How many years did you use tobacco: None.   Alcohol use (drinks per week): None.   Diet: I eat a low fat diet. Much in vegetables.    Do you drink/eat things with caffeine: Yes.   Marital status:  Divorced                                What year were you married? 1976   Do you live in a house, apartment, assisted living, condo, trailer, etc.? Rent small house.   Is it one or more stories? One.   How many persons live in your home? I live alone.    Do you have pets in your home?( please list) No.   Highest Level of education completed? Masters Degree Plus.   Current or past profession: Pharmacist, hospital.   Do you exercise? Yes.                                  Type and how often? 5 days a week I walk.    Do you have a living will? Yes.   Do you have a DNR form?    Yes                               If not, do you want to discuss one?   Do you have signed POA/HPOA forms?  Yes.                      If so, please bring to you appointment      Do you have any difficulty  bathing or dressing yourself? No.   Do you have any difficulty preparing food or eating? No.   Do you have any difficulty managing your medications? No.   Do you have any  difficulty managing your finances? Yes.   Do you have any difficulty affording your medications? No.   Social Determinants of Health   Financial Resource Strain: Low Risk   . Difficulty of Paying Living Expenses: Not hard at all  Food Insecurity: No Food Insecurity  . Worried About Charity fundraiser in the Last Year: Never true  . Ran Out of Food in the Last Year: Never true  Transportation Needs: No Transportation Needs  . Lack of Transportation (Medical): No  . Lack of Transportation (Non-Medical): No  Physical Activity: Sufficiently Active  . Days of Exercise per Week: 7 days  . Minutes of Exercise per Session: 60 min  Stress: No Stress Concern Present  . Feeling of Stress : Not at all  Social Connections: Moderately Integrated  . Frequency of Communication with Friends and Family: More than three times a week  . Frequency of Social Gatherings with Friends and Family: Twice a week  . Attends Religious Services: More than 4 times per year  . Active Member of Clubs or Organizations: Yes  . Attends Archivist Meetings: More than 4 times per year  . Marital Status: Divorced     Family History: The patient's family history includes Arthritis in her mother; Cancer in her father; Dementia in her father; Diabetes in her brother; Diabetes type II in her son; Heart disease in her mother; Hypertension in her brother, sister, sister, and sister; Thyroid disease in her father. History of coronary artery disease notable for multiple members. History of heart failure notable for multiple members.  ROS:   Please see the history of present illness.    Notes occasional feeling hot for 30 minutes.  All other systems reviewed and are negative.  EKGs/Labs/Other Studies Reviewed:    The following studies were reviewed today:  EKG:   05/05/20: SR 95 non-specific TWI 04/15/20: SR 87 nonspecific TWI with baseline artifact  Recent Labs: 10/17/2019: ALT 13; BUN 27; Creat 0.72;  Hemoglobin 12.2; Platelets 255; Potassium 4.7; Sodium 138; TSH 0.73  Recent Lipid Panel    Component Value Date/Time   CHOL 165 09/25/2018 0825   TRIG 56.0 09/25/2018 0825   HDL 74.50 09/25/2018 0825   CHOLHDL 2 09/25/2018 0825   VLDL 11.2 09/25/2018 0825   LDLCALC 79 09/25/2018 0825   Risk Assessment/Calculations:     The 10-year ASCVD risk score Mikey Bussing DC Brooke Bonito., et al., 2013) is: 26%   Values used to calculate the score:     Age: 69 years     Sex: Female     Is Non-Hispanic African American: No     Diabetic: No     Tobacco smoker: No     Systolic Blood Pressure: 169 mmHg     Is BP treated: Yes     HDL Cholesterol: 74.5 mg/dL     Total Cholesterol: 165 mg/dL   Physical Exam:    VS:  BP 136/72   Pulse 96   Ht 5\' 5"  (1.651 m)   Wt 135 lb (61.2 kg)   SpO2 99%   BMI 22.47 kg/m     Wt Readings from Last 3 Encounters:  05/05/20 135 lb (61.2 kg)  04/15/20 136 lb (61.7 kg)  10/17/19 138 lb 4.8 oz (62.7 kg)  GEN:  Well nourished, well developed in no acute distress HEENT: Normal NECK: No JVD; No carotid bruits LYMPHATICS: No lymphadenopathy CARDIAC: RRR, no murmurs, rubs, gallops; R radial +2 RESPIRATORY:  Clear to auscultation without rales, wheezing or rhonchi  ABDOMEN: Soft, non-tender, non-distended MUSCULOSKELETAL:  No edema; No deformity  SKIN: Warm and dry NEUROLOGIC:  Alert and oriented x 3 PSYCHIATRIC:  Normal affect   ASSESSMENT:    1. Unstable angina ([place])   2. Angina pectoris ([place])   3. Primary hypertension   4. Near syncope    PLAN:    In order of problems listed above:  Angina Pectoris (unstable) - The patient presents with cardiac chest pain with anginal pain with DOE and near syncope and escalation of her symptoms - ASCVD risk estimated at 26% - Discussed risks and benefits of cardiac catheterization have been discussed with the patient.  These include bleeding, infection, kidney damage, stroke, heart attack, death.  The patient understands  these risks and is willing to proceed if necessary - discussed red flag symptoms requiring ED or urgent visit; patient brings her sister who is taking notes  Post cath follow up unless new symptoms or abnormal test results warranting change in plan  Would be reasonable for  APP Follow up   Shared Decision Making/Informed Consent The risks [stroke (1 in 1000), death (1 in 1000), kidney failure [usually temporary] (1 in 500), bleeding (1 in 200), allergic reaction [possibly serious] (1 in 200)], benefits (diagnostic support and management of coronary artery disease) and alternatives of a cardiac catheterization were discussed in detail with Jackie Jones and she is willing to proceed.        Medication Adjustments/Labs and Tests Ordered: Current medicines are reviewed at length with the patient today.  Concerns regarding medicines are outlined above.  Orders Placed This Encounter  Procedures  . Basic metabolic panel  . CBC  . EKG 12-Lead   No orders of the defined types were placed in this encounter.   Patient Instructions  Medication Instructions:  Your physician recommends that you continue on your current medications as directed. Please refer to the Current Medication list given to you today.  *If you need a refill on your cardiac medications before your next appointment, please call your pharmacy*   Lab Work: TODAY: CBC and BMET If you have labs (blood work) drawn today and your tests are completely normal, you will receive your results only by: Marland Kitchen MyChart Message (if you have MyChart) OR . A paper copy in the mail If you have any lab test that is abnormal or we need to change your treatment, we will call you to review the results.   Testing/Procedures: Your physician has requested that you have a cardiac catheterization. Cardiac catheterization is used to diagnose and/or treat various heart conditions. Doctors may recommend this procedure for a number of different reasons.  The most common reason is to evaluate chest pain. Chest pain can be a symptom of coronary artery disease (CAD), and cardiac catheterization can show whether plaque is narrowing or blocking your heart's arteries. This procedure is also used to evaluate the valves, as well as measure the blood flow and oxygen levels in different parts of your heart. Please follow instruction sheet, as given.        COVID TEST--Friday 05/07/20 at 10:50am  -- You will go to [facility]. [place], [place] [address]  for your Covid testing.   This is a drive thru test site.  Be sure to share with the first checkpoint that you are there for pre-procedure/surgery testing. Stay in your car and the nurse team will come to your car to test you.  After you are tested please go home and self-quarantine until the day of your procedure on Monday 05/10/2020.         Follow-Up: At [facility], you and your health needs are our priority.  As part of our continuing mission to provide you with exceptional heart care, we have created designated Provider Care Teams.  These Care Teams include your primary Cardiologist (physician) and Advanced Practice Providers (APPs -  Physician Assistants and Nurse Practitioners) who all work together to provide you with the care you need, when you need it.  We recommend signing up for the patient portal called "MyChart".  Sign up information is provided on this After Visit Summary.  MyChart is used to connect with patients for Virtual Visits (Telemedicine).  Patients are able to view lab/test results, encounter notes, upcoming appointments, etc.  Non-urgent messages can be sent to your provider as well.   To learn more about what you can do with MyChart, go to [email or web address].    Your next appointment:   2 week(s)  The format for your next appointment:   In Person  Provider:   You may see Gasper Sells, MD or one of the following Advanced Practice Providers on your designated Care  Team:    Melina Copa, PA-C  Ermalinda Barrios, PA-C    Other Instructions     [facility] OFFICE [place], [place] [address] Dept: [place]: [phone]  Jackie Jones  05/05/2020  You are scheduled for a Cardiac Catheterization on Monday, February 21 with Dr. Harrell Gave End.  1. Please arrive at the [facility] (Main Entrance A) at [facility]: [place], [place] [address] at 8:30 AM (This time is two hours before your procedure to ensure your preparation). Free valet parking service is available.   Special note: Every effort is made to have your procedure done on time. Please understand that emergencies sometimes delay scheduled procedures.  2. Diet: Do not eat solid foods after midnight.  The patient may have clear liquids until 5am upon the day of the procedure.  3. Labs: TODAY  4. Medication instructions in preparation for your procedure:   On the morning of your procedure, take your Aspirin 81mg  and any morning medicines NOT listed above.  You may use sips of water.  5. Plan for one night stay--bring personal belongings. 6. Bring a current list of your medications and current insurance cards. 7. You MUST have a responsible person to drive you home. 8. Someone MUST be with you the first 24 hours after you arrive home or your discharge will be delayed. 9. Please wear clothes that are easy to get on and off and wear slip-on shoes.  Thank you for allowing [place] to care for you!   -- [facility] Invasive Cardiovascular services      Signed, Werner Lean, MD  05/05/2020 3:01 PM    [place]

## 2020-05-05 NOTE — Patient Instructions (Addendum)
Medication Instructions:  Your physician recommends that you continue on your current medications as directed. Please refer to the Current Medication list given to you today.  *If you need a refill on your cardiac medications before your next appointment, please call your pharmacy*   Lab Work: TODAY: CBC and BMET If you have labs (blood work) drawn today and your tests are completely normal, you will receive your results only by: Marland Kitchen MyChart Message (if you have MyChart) OR . A paper copy in the mail If you have any lab test that is abnormal or we need to change your treatment, we will call you to review the results.   Testing/Procedures: Your physician has requested that you have a cardiac catheterization. Cardiac catheterization is used to diagnose and/or treat various heart conditions. Doctors may recommend this procedure for a number of different reasons. The most common reason is to evaluate chest pain. Chest pain can be a symptom of coronary artery disease (CAD), and cardiac catheterization can show whether plaque is narrowing or blocking your heart's arteries. This procedure is also used to evaluate the valves, as well as measure the blood flow and oxygen levels in different parts of your heart. Please follow instruction sheet, as given.        COVID TEST--Friday 05/07/20 at 10:50am  -- You will go to [facility]. [place], [place] [address]  for your Covid testing.   This is a drive thru test site.   Be sure to share with the first checkpoint that you are there for pre-procedure/surgery testing. Stay in your car and the nurse team will come to your car to test you.  After you are tested please go home and self-quarantine until the day of your procedure on Monday 05/10/2020.         Follow-Up: At [facility], you and your health needs are our priority.  As part of our continuing mission to provide you with exceptional heart care, we have created designated Provider Care Teams.   These Care Teams include your primary Cardiologist (physician) and Advanced Practice Providers (APPs -  Physician Assistants and Nurse Practitioners) who all work together to provide you with the care you need, when you need it.  We recommend signing up for the patient portal called "MyChart".  Sign up information is provided on this After Visit Summary.  MyChart is used to connect with patients for Virtual Visits (Telemedicine).  Patients are able to view lab/test results, encounter notes, upcoming appointments, etc.  Non-urgent messages can be sent to your provider as well.   To learn more about what you can do with MyChart, go to [email or web address].    Your next appointment:   2 week(s)  The format for your next appointment:   In Person  Provider:   You may see Gasper Sells, MD or one of the following Advanced Practice Providers on your designated Care Team:    Melina Copa, PA-C  Ermalinda Barrios, PA-C    Other Instructions     [facility] OFFICE [place], [place] [address] Dept: [place]: [phone]  Laurina Fischl  05/05/2020  You are scheduled for a Cardiac Catheterization on Monday, February 21 with Dr. Harrell Gave End.  1. Please arrive at the [facility] (Main Entrance A) at [facility]: [place], [place] [address] at 8:30 AM (This time is two hours before your procedure to ensure your  preparation). Free valet parking service is available.   Special note: Every effort is made to have your procedure done on time. Please understand that emergencies sometimes delay scheduled procedures.  2. Diet: Do not eat solid foods after midnight.  The patient may have clear liquids until 5am upon the day of the procedure.  3. Labs: TODAY  4. Medication instructions in preparation for your procedure:   On the morning of your procedure, take  your Aspirin 81mg  and any morning medicines NOT listed above.  You may use sips of water.  5. Plan for one night stay--bring personal belongings. 6. Bring a current list of your medications and current insurance cards. 7. You MUST have a responsible person to drive you home. 8. Someone MUST be with you the first 24 hours after you arrive home or your discharge will be delayed. 9. Please wear clothes that are easy to get on and off and wear slip-on shoes.  Thank you for allowing [place] to care for you!   -- [place] Invasive Cardiovascular services

## 2020-05-06 ENCOUNTER — Telehealth: Payer: Self-pay

## 2020-05-06 ENCOUNTER — Telehealth: Payer: Self-pay | Admitting: *Deleted

## 2020-05-06 LAB — CBC
Hematocrit: 38 % (ref 34.0–46.6)
Hemoglobin: 12.9 g/dL (ref 11.1–15.9)
MCH: 29.9 pg (ref 26.6–33.0)
MCHC: 33.9 g/dL (ref 31.5–35.7)
MCV: 88 fL (ref 79–97)
Platelets: 239 10*3/uL (ref 150–450)
RBC: 4.31 x10E6/uL (ref 3.77–5.28)
RDW: 12.3 % (ref 11.7–15.4)
WBC: 7.1 10*3/uL (ref 3.4–10.8)

## 2020-05-06 LAB — BASIC METABOLIC PANEL
BUN/Creatinine Ratio: 30 — ABNORMAL HIGH (ref 12–28)
BUN: 26 mg/dL (ref 8–27)
CO2: 20 mmol/L (ref 20–29)
Calcium: 9.5 mg/dL (ref 8.7–10.3)
Chloride: 99 mmol/L (ref 96–106)
Creatinine, Ser: 0.88 mg/dL (ref 0.57–1.00)
GFR calc Af Amer: 74 mL/min/{1.73_m2} (ref >59)
GFR calc non Af Amer: 64 mL/min/{1.73_m2} (ref >59)
Glucose: 87 mg/dL (ref 65–99)
Potassium: 5.5 mmol/L — ABNORMAL HIGH (ref 3.5–5.2)
Sodium: 137 mmol/L (ref 134–144)

## 2020-05-06 NOTE — Telephone Encounter (Signed)
Pt contacted pre-catheterization scheduled at [facility] for: Monday May 10, 2020 10:30 AM Verified arrival time and place: [facility]) at: 8 AM-needs BMP   No solid food after midnight prior to cath, clear liquids until 5 AM day of procedure.  Hold: Olmesartan-on hold starting 05/06/20  Except hold medications AM meds can be  taken pre-cath with sips of water including: ASA 81 mg   Confirmed patient has responsible adult to drive home post procedure and be with patient first 24 hours after arriving home: yes  You are allowed ONE visitor in the waiting room during the time you are at the hospital for your procedure. Both you and your visitor must wear a mask once you enter the hospital.   Reviewed procedure/mask/visitor instructions with patient.

## 2020-05-06 NOTE — Telephone Encounter (Signed)
Pt contacted pre-catheterization scheduled at [facility] for: Monday May 10, 2020 10:30 AM Verified arrival time and place: [facility]) at:   No solid food after midnight prior to cath, clear liquids until 5 AM day of procedure. CONTRAST ALLERGY:  AM meds can be  taken pre-cath with sips of water including: ASA 81 mg   Confirmed patient has responsible adult to drive home post procedure and be with patient first 24 hours after arriving home:  You are allowed ONE visitor in the waiting room during the time you are at the hospital for your procedure. Both you and your visitor must wear a mask once you enter the hospital.

## 2020-05-06 NOTE — Telephone Encounter (Signed)
Called pt re: her K 5.5.Marland KitchenMarland Kitchen. and advised her Dr. Oralia Rud recommendation as below:   Let's hold her olmesartan and recheck the day of her heart catheterization for safety   Will forward to [facility] for her information for labs on 05/10/20 when she is due for her Cath.

## 2020-05-07 ENCOUNTER — Other Ambulatory Visit (HOSPITAL_COMMUNITY)
Admission: RE | Admit: 2020-05-07 | Discharge: 2020-05-07 | Disposition: A | Discharge status: Home / Self Care | Payer: Medicare Other | Source: Ambulatory Visit | Attending: Internal Medicine | Admitting: Internal Medicine

## 2020-05-07 DIAGNOSIS — Z01812 Encounter for preprocedural laboratory examination: Secondary | ICD-10-CM | POA: Insufficient documentation

## 2020-05-07 DIAGNOSIS — Z20822 Contact with and (suspected) exposure to covid-19: Secondary | ICD-10-CM | POA: Insufficient documentation

## 2020-05-07 LAB — SARS CORONAVIRUS 2 (TAT 6-24 HRS): SARS Coronavirus 2: NEGATIVE

## 2020-05-10 ENCOUNTER — Encounter (HOSPITAL_COMMUNITY): Payer: Self-pay | Admitting: Internal Medicine

## 2020-05-10 ENCOUNTER — Encounter (HOSPITAL_COMMUNITY)
Admission: RE | Disposition: A | Discharge status: Home / Self Care | Payer: Self-pay | Source: Ambulatory Visit | Attending: Internal Medicine

## 2020-05-10 ENCOUNTER — Other Ambulatory Visit: Payer: Self-pay

## 2020-05-10 ENCOUNTER — Encounter: Payer: Self-pay | Admitting: Internal Medicine

## 2020-05-10 ENCOUNTER — Ambulatory Visit (HOSPITAL_COMMUNITY)
Admission: RE | Admit: 2020-05-10 | Discharge: 2020-05-10 | Disposition: A | Discharge status: Home / Self Care | Payer: Medicare Other | Source: Ambulatory Visit | Attending: Internal Medicine | Admitting: Internal Medicine

## 2020-05-10 DIAGNOSIS — I10 Essential (primary) hypertension: Secondary | ICD-10-CM | POA: Insufficient documentation

## 2020-05-10 DIAGNOSIS — R55 Syncope and collapse: Secondary | ICD-10-CM | POA: Diagnosis not present

## 2020-05-10 DIAGNOSIS — F0390 Unspecified dementia without behavioral disturbance: Secondary | ICD-10-CM | POA: Diagnosis not present

## 2020-05-10 DIAGNOSIS — I499 Cardiac arrhythmia, unspecified: Secondary | ICD-10-CM

## 2020-05-10 DIAGNOSIS — I209 Angina pectoris, unspecified: Secondary | ICD-10-CM

## 2020-05-10 DIAGNOSIS — I25110 Atherosclerotic heart disease of native coronary artery with unstable angina pectoris: Secondary | ICD-10-CM | POA: Insufficient documentation

## 2020-05-10 DIAGNOSIS — Z79899 Other long term (current) drug therapy: Secondary | ICD-10-CM | POA: Insufficient documentation

## 2020-05-10 DIAGNOSIS — Z885 Allergy status to narcotic agent status: Secondary | ICD-10-CM | POA: Insufficient documentation

## 2020-05-10 DIAGNOSIS — Z79890 Hormone replacement therapy (postmenopausal): Secondary | ICD-10-CM | POA: Insufficient documentation

## 2020-05-10 DIAGNOSIS — E039 Hypothyroidism, unspecified: Secondary | ICD-10-CM | POA: Diagnosis not present

## 2020-05-10 DIAGNOSIS — I200 Unstable angina: Secondary | ICD-10-CM | POA: Diagnosis present

## 2020-05-10 DIAGNOSIS — Z880 Allergy status to penicillin: Secondary | ICD-10-CM | POA: Diagnosis not present

## 2020-05-10 DIAGNOSIS — Z888 Allergy status to other drugs, medicaments and biological substances status: Secondary | ICD-10-CM | POA: Diagnosis not present

## 2020-05-10 HISTORY — PX: LEFT HEART CATH AND CORONARY ANGIOGRAPHY: CATH118249

## 2020-05-10 HISTORY — DX: Cardiac arrhythmia, unspecified: I49.9

## 2020-05-10 LAB — BASIC METABOLIC PANEL
Anion gap: 10 (ref 5–15)
BUN: 26 mg/dL — ABNORMAL HIGH (ref 8–23)
CO2: 26 mmol/L (ref 22–32)
Calcium: 9.3 mg/dL (ref 8.9–10.3)
Chloride: 99 mmol/L (ref 98–111)
Creatinine, Ser: 0.76 mg/dL (ref 0.44–1.00)
GFR, Estimated: >60 mL/min (ref >60)
Glucose, Bld: 91 mg/dL (ref 70–99)
Potassium: 4.3 mmol/L (ref 3.5–5.1)
Sodium: 135 mmol/L (ref 135–145)

## 2020-05-10 SURGERY — LEFT HEART CATH AND CORONARY ANGIOGRAPHY
Anesthesia: LOCAL

## 2020-05-10 MED ORDER — SODIUM CHLORIDE 0.9 % IV SOLN: 250.0000 mL | INTRAVENOUS | Status: DC | PRN

## 2020-05-10 MED ORDER — FENTANYL CITRATE (PF) 100 MCG/2ML IJ SOLN
INTRAMUSCULAR | Status: AC
  Filled 2020-05-10: qty 2

## 2020-05-10 MED ORDER — ONDANSETRON HCL 4 MG/2ML IJ SOLN: 4.0000 mg | Freq: Four times a day (QID) | INTRAMUSCULAR | Status: DC | PRN

## 2020-05-10 MED ORDER — HYDRALAZINE HCL 20 MG/ML IJ SOLN: 10.0000 mg | INTRAMUSCULAR | Status: DC | PRN

## 2020-05-10 MED ORDER — SODIUM CHLORIDE 0.9% FLUSH: 3.0000 mL | INTRAVENOUS | Status: DC | PRN

## 2020-05-10 MED ORDER — SODIUM CHLORIDE 0.9 % IV SOLN: INTRAVENOUS | Status: DC

## 2020-05-10 MED ORDER — MIDAZOLAM HCL 2 MG/2ML IJ SOLN
INTRAMUSCULAR | Status: DC | PRN
  Administered 2020-05-10: 1 mg via INTRAVENOUS

## 2020-05-10 MED ORDER — MIDAZOLAM HCL 2 MG/2ML IJ SOLN
INTRAMUSCULAR | Status: AC
  Filled 2020-05-10: qty 2

## 2020-05-10 MED ORDER — SODIUM CHLORIDE 0.9 % WEIGHT BASED INFUSION: 1.0000 mL/kg/h | INTRAVENOUS | Status: DC

## 2020-05-10 MED ORDER — DIPHENHYDRAMINE HCL 50 MG/ML IJ SOLN
25.0000 mg | Freq: Once | INTRAMUSCULAR | Status: AC
  Administered 2020-05-10: 25 mg via INTRAVENOUS
  Filled 2020-05-10: qty 1

## 2020-05-10 MED ORDER — HEPARIN SODIUM (PORCINE) 1000 UNIT/ML IJ SOLN
INTRAMUSCULAR | Status: DC | PRN
  Administered 2020-05-10: 3000 [IU] via INTRAVENOUS

## 2020-05-10 MED ORDER — HEPARIN (PORCINE) IN NACL 1000-0.9 UT/500ML-% IV SOLN
INTRAVENOUS | Status: AC
  Filled 2020-05-10: qty 1000

## 2020-05-10 MED ORDER — SODIUM CHLORIDE 0.9 % WEIGHT BASED INFUSION
3.0000 mL/kg/h | INTRAVENOUS | Status: AC
  Administered 2020-05-10: 3 mL/kg/h via INTRAVENOUS

## 2020-05-10 MED ORDER — ASPIRIN 81 MG PO CHEW: 81.0000 mg | CHEWABLE_TABLET | ORAL | Status: DC

## 2020-05-10 MED ORDER — HEPARIN SODIUM (PORCINE) 1000 UNIT/ML IJ SOLN
INTRAMUSCULAR | Status: AC
  Filled 2020-05-10: qty 1

## 2020-05-10 MED ORDER — VERAPAMIL HCL 2.5 MG/ML IV SOLN
INTRAVENOUS | Status: DC | PRN
  Administered 2020-05-10: 10 mL via INTRA_ARTERIAL

## 2020-05-10 MED ORDER — ACETAMINOPHEN 325 MG PO TABS: 650.0000 mg | ORAL_TABLET | ORAL | Status: DC | PRN

## 2020-05-10 MED ORDER — IOHEXOL 350 MG/ML SOLN
INTRAVENOUS | Status: DC | PRN
  Administered 2020-05-10: 50 mL

## 2020-05-10 MED ORDER — LABETALOL HCL 5 MG/ML IV SOLN: 10.0000 mg | INTRAVENOUS | Status: DC | PRN

## 2020-05-10 MED ORDER — LIDOCAINE HCL (PF) 1 % IJ SOLN
INTRAMUSCULAR | Status: AC
  Filled 2020-05-10: qty 30

## 2020-05-10 MED ORDER — VERAPAMIL HCL 2.5 MG/ML IV SOLN
INTRAVENOUS | Status: AC
  Filled 2020-05-10: qty 2

## 2020-05-10 MED ORDER — FENTANYL CITRATE (PF) 100 MCG/2ML IJ SOLN
INTRAMUSCULAR | Status: DC | PRN
  Administered 2020-05-10: 25 ug via INTRAVENOUS

## 2020-05-10 MED ORDER — LIDOCAINE HCL (PF) 1 % IJ SOLN
INTRAMUSCULAR | Status: DC | PRN
  Administered 2020-05-10: 2 mL

## 2020-05-10 MED ORDER — SODIUM CHLORIDE 0.9% FLUSH: 3.0000 mL | Freq: Two times a day (BID) | INTRAVENOUS | Status: DC

## 2020-05-10 MED ORDER — METHYLPREDNISOLONE SODIUM SUCC 125 MG IJ SOLR
125.0000 mg | Freq: Once | INTRAMUSCULAR | Status: AC
  Administered 2020-05-10: 125 mg via INTRAVENOUS
  Filled 2020-05-10: qty 2

## 2020-05-10 MED ORDER — HEPARIN (PORCINE) IN NACL 1000-0.9 UT/500ML-% IV SOLN
INTRAVENOUS | Status: DC | PRN
  Administered 2020-05-10 (×2): 500 mL

## 2020-05-10 SURGICAL SUPPLY — 9 items
CATH 5FR JL3.5 JR4 ANG PIG MP (CATHETERS) ×2 IMPLANT
DEVICE RAD TR BAND REGULAR (VASCULAR PRODUCTS) ×4 IMPLANT
GLIDESHEATH SLEND SS 6F .021 (SHEATH) ×4 IMPLANT
GUIDEWIRE INQWIRE 1.5J.035X260 (WIRE) ×1 IMPLANT
INQWIRE 1.5J .035X260CM (WIRE) ×2
KIT HEART LEFT (KITS) ×2 IMPLANT
PACK CARDIAC CATHETERIZATION (CUSTOM PROCEDURE TRAY) ×2 IMPLANT
TRANSDUCER W/STOPCOCK (MISCELLANEOUS) ×2 IMPLANT
TUBING CIL FLEX 10 FLL-RA (TUBING) ×2 IMPLANT

## 2020-05-10 NOTE — Brief Op Note (Signed)
BRIEF CARDIAC CATHETERIZATION NOTE  05/10/2020  10:40 AM  PATIENT:  Jackie Jones  75 y.o. female  PRE-OPERATIVE DIAGNOSIS:  unstable angina  POST-OPERATIVE DIAGNOSIS:  * No post-op diagnosis entered *  PROCEDURE:  Procedure(s): LEFT HEART CATH AND CORONARY ANGIOGRAPHY (N/A)  SURGEON:  Surgeon(s) and Role:    * Kalasia Crafton, Harrell Gave, MD - Primary  FINDINGS: 1. Minimal coronary artery disease. 2. Normal LVEF and LVEDP.  RECOMMENDATIONS: 1. Medical therapy and risk factor modification.  Nelva Bush, MD [facility]

## 2020-05-10 NOTE — Progress Notes (Signed)
Patient was asked about allergy/reaction to contrast dye and reports that she "just gets a little itchy after scans, but it gets better quickly." Anderson Malta, RN notified. Orders placed for solumedrol and benadryl pre-procedure.

## 2020-05-10 NOTE — Discharge Instructions (Signed)
Radial Site Care  This sheet gives you information about how to care for yourself after your procedure. Your health care provider may also give you more specific instructions. If you have problems or questions, contact your health care provider. What can I expect after the procedure? After the procedure, it is common to have:  Bruising and tenderness at the catheter insertion area. Follow these instructions at home: Medicines  Take over-the-counter and prescription medicines only as told by your health care provider. Insertion site care  Follow instructions from your health care provider about how to take care of your insertion site. Make sure you: ? Wash your hands with soap and water before you change your bandage (dressing). If soap and water are not available, use hand sanitizer. ? Change your dressing as told by your health care provider. ? Leave stitches (sutures), skin glue, or adhesive strips in place. These skin closures may need to stay in place for 2 weeks or longer. If adhesive strip edges start to loosen and curl up, you may trim the loose edges. Do not remove adhesive strips completely unless your health care provider tells you to do that.  Check your insertion site every day for signs of infection. Check for: ? Redness, swelling, or pain. ? Fluid or blood. ? Pus or a bad smell. ? Warmth.  Do not take baths, swim, or use a hot tub until your health care provider approves.  You may shower 24-48 hours after the procedure, or as directed by your health care provider. ? Remove the dressing and gently wash the site with plain soap and water. ? Pat the area dry with a clean towel. ? Do not rub the site. That could cause bleeding.  Do not apply powder or lotion to the site. Activity  For 24 hours after the procedure, or as directed by your health care provider: ? Do not flex or bend the affected arm. ? Do not push or pull heavy objects with the affected arm. ? Do not drive  yourself home from the hospital or clinic. You may drive 24 hours after the procedure unless your health care provider tells you not to. ? Do not operate machinery or power tools.  Do not lift anything that is heavier than 10 lb (4.5 kg), or the limit that you are told, until your health care provider says that it is safe.  Ask your health care provider when it is okay to: ? Return to work or school. ? Resume usual physical activities or sports. ? Resume sexual activity.   General instructions  If the catheter site starts to bleed, raise your arm and put firm pressure on the site. If the bleeding does not stop, get help right away. This is a medical emergency.  If you went home on the same day as your procedure, a responsible adult should be with you for the first 24 hours after you arrive home.  Keep all follow-up visits as told by your health care provider. This is important. Contact a health care provider if:  You have a fever.  You have redness, swelling, or yellow drainage around your insertion site. Get help right away if:  You have unusual pain at the radial site.  The catheter insertion area swells very fast.  The insertion area is bleeding, and the bleeding does not stop when you hold steady pressure on the area.  Your arm or hand becomes pale, cool, tingly, or numb. These symptoms may represent a serious   problem that is an emergency. Do not wait to see if the symptoms will go away. Get medical help right away. Call your local emergency services (911 in the [place] Do not drive yourself to the hospital. Summary  After the procedure, it is common to have bruising and tenderness at the site.  Follow instructions from your health care provider about how to take care of your radial site wound. Check the wound every day for signs of infection.  Do not lift anything that is heavier than 10 lb (4.5 kg), or the limit that you are told, until your health care provider says that it  is safe. This information is not intended to replace advice given to you by your health care provider. Make sure you discuss any questions you have with your health care provider. Document Revised: 04/11/2017 Document Reviewed: 04/11/2017 Elsevier Patient Education  2021 Elsevier Inc.  

## 2020-05-10 NOTE — Interval H&P Note (Signed)
History and Physical Interval Note:  05/10/2020 9:56 AM  Darlys Gales  has presented today for surgery, with the diagnosis of unstable angina.  The various methods of treatment have been discussed with the patient and family. After consideration of risks, benefits and other options for treatment, the patient has consented to  Procedure(s): LEFT HEART CATH AND CORONARY ANGIOGRAPHY (N/A) as a surgical intervention.  The patient's history has been reviewed, patient examined, no change in status, stable for surgery.  I have reviewed the patient's chart and labs.  Questions were answered to the patient's satisfaction.    Cath Lab Visit (complete for each Cath Lab visit)  Clinical Evaluation Leading to the Procedure:   ACS: No.  Non-ACS:    Anginal Classification: CCS IV  Anti-ischemic medical therapy: Minimal Therapy (1 class of medications)  Non-Invasive Test Results: No non-invasive testing performed  Prior CABG: No previous CABG  Ryan Palermo

## 2020-05-12 ENCOUNTER — Other Ambulatory Visit: Payer: Self-pay

## 2020-05-12 MED ORDER — CELECOXIB 50 MG PO CAPS: 50.0000 mg | ORAL_CAPSULE | Freq: Two times a day (BID) | ORAL | 1 refills | Status: DC

## 2020-05-12 NOTE — Telephone Encounter (Signed)
Incoming call received from patient requesting refill on Celebrex for a 90 day supply to [facility].

## 2020-05-13 ENCOUNTER — Ambulatory Visit (INDEPENDENT_AMBULATORY_CARE_PROVIDER_SITE_OTHER): Payer: Medicare Other | Admitting: Psychology

## 2020-05-13 DIAGNOSIS — F332 Major depressive disorder, recurrent severe without psychotic features: Secondary | ICD-10-CM

## 2020-05-21 ENCOUNTER — Encounter: Payer: Self-pay | Admitting: Radiology

## 2020-05-21 ENCOUNTER — Encounter: Payer: Self-pay | Admitting: Internal Medicine

## 2020-05-21 ENCOUNTER — Other Ambulatory Visit: Payer: Self-pay

## 2020-05-21 ENCOUNTER — Ambulatory Visit (INDEPENDENT_AMBULATORY_CARE_PROVIDER_SITE_OTHER): Payer: Medicare Other

## 2020-05-21 ENCOUNTER — Ambulatory Visit (INDEPENDENT_AMBULATORY_CARE_PROVIDER_SITE_OTHER): Payer: Medicare Other | Admitting: Internal Medicine

## 2020-05-21 VITALS — BP 130/76 | HR 95 | Ht 65.0 in | Wt 134.8 lb

## 2020-05-21 DIAGNOSIS — R002 Palpitations: Secondary | ICD-10-CM | POA: Diagnosis not present

## 2020-05-21 DIAGNOSIS — I2510 Atherosclerotic heart disease of native coronary artery without angina pectoris: Secondary | ICD-10-CM

## 2020-05-21 DIAGNOSIS — I10 Essential (primary) hypertension: Secondary | ICD-10-CM | POA: Diagnosis not present

## 2020-05-21 NOTE — Progress Notes (Signed)
Enrolled patient for a 14 day Zio XT  monitor to be mailed to patients home  °

## 2020-05-21 NOTE — Progress Notes (Signed)
Cardiology Office Note:    Date:  05/21/2020   ID:  Jackie Jones, DOB 29-Jun-1945, MRN [id number]  PCP:  Gayland Curry Townsend  Cardiologist:  Rudean Haskell MD Advanced Practice Provider:  No care team member to display Electrophysiologist:  None      CC: Follow up Cath  History of Present Illness:    Jackie Jones is a 75 y.o. female with a hx of HTN, Dementia NOS (reasonably functional, does ADLs, but has some mobility issues and uses cane since the summer per sister, hypothyroidism who presented for evaluation 05/05/20. In interim of this visit, patient had non obstructive coronaries on LHC.  Seen 05/21/20.  Patient notes that she is relieved she didn't have epicardial disease.  Since last visit notes rapid heart beat, pain in the jaw, and dizziness.  Relevant interval testing or therapy include LHC. There are no interval hospital/ED visit.    No chest pain or pressure.  No SOB at rest.  Notes between 11 AM- 2 PM; patient can be lying down and feel dizziness. DOE and feels pain in the jaw.  No weight gain or leg swelling.  Notes palpitations that are sudden onset and sudden termination while supine,  notes near syncope even when lying down.  Patient notes that she has a prior CMR to evaluation her heart, and notes she has the papers at home.    Ambulatory blood pressure  130/70 (has decrease log amount since last visit).  Past Medical History:  Diagnosis Date  . Allergy   . Arthritis   . Bipolar 1 disorder, depressed ([place])   . Bipolar disorder ([place])   . Chicken pox   . Dementia ([place])   . Depression   . Hashimoto's thyroiditis   . History of blood transfusion   . History of bone density study 2019  . History of colonoscopy 2015  . History of CT scan 2019  . History of mammogram 2019  . History of MRI   . History of Papanicolaou smear of cervix   . Hx: UTI (urinary tract infection)   . Hypertension   . Hypothyroidism    . Lactose intolerance   . Legally blind in left eye, as defined in [place]   . Non-celiac gluten sensitivity   . Osteoporosis   . Pernicious anemia   . Pernicious anemia   . Rheumatic fever   . Thyroid disease   . Urinary and fecal incontinence     Past Surgical History:  Procedure Laterality Date  . ABDOMINAL HYSTERECTOMY     1981  . APPENDECTOMY  1981  . LEFT HEART CATH AND CORONARY ANGIOGRAPHY N/A 05/10/2020   Procedure: LEFT HEART CATH AND CORONARY ANGIOGRAPHY;  Surgeon: Nelva Bush, MD;  Location: [facility];  Service: Cardiovascular;  Laterality: N/A;  . prolapsed rectum 2015    . TONSILLECTOMY AND ADENOIDECTOMY  1966  . TUBAL LIGATION      Current Medications: Current Meds  Medication Sig  . alendronate (FOSAMAX) 70 MG tablet TAKE 1 TABLET(70 MG) BY MOUTH 1 TIME A WEEK WITH A FULL GLASS OF WATER AND ON AN EMPTY STOMACH  . amLODipine (NORVASC) 10 MG tablet TAKE 1 TABLET(10 MG) BY MOUTH DAILY  . celecoxib (CELEBREX) 50 MG capsule Take 1 capsule (50 mg total) by mouth 2 (two) times daily.  . cyanocobalamin (,VITAMIN B-12,) 1000 MCG/ML injection INJECT 1 ML INTO THE MUSCLE EVERY 30 DAYS  . divalproex (  DEPAKOTE ER) 500 MG 24 hr tablet Take 1 tablet (500 mg total) by mouth at bedtime.  Marland Kitchen levothyroxine (SYNTHROID) 125 MCG tablet TAKE 1 TABLET(125 MCG) BY MOUTH DAILY  . Multiple Vitamins-Minerals (PRESERVISION AREDS) CAPS Take by mouth in the morning and at bedtime.  Marland Kitchen olmesartan (BENICAR) 20 MG tablet TAKE 1 TABLET(20 MG) BY MOUTH DAILY  . OVER THE COUNTER MEDICATION Take 10 mg by mouth daily. AllerClear 10 mg  . OVER THE COUNTER MEDICATION Apply 1 application topically as needed. DEEP BLUE SALVE  . Polyethyl Glycol-Propyl Glycol 0.4-0.3 % SOLN Place 1 drop into both eyes in the morning and at bedtime.  . RESTASIS 0.05 % ophthalmic emulsion Place 1 drop into both eyes 2 (two) times daily.  . SYRINGE-NEEDLE, DISP, 3 ML (BD SAFETYGLIDE SYRINGE/NEEDLE) 25G X 1" 3 ML MISC  Inject 61ml in deltoid once monthly  . venlafaxine XR (EFFEXOR-XR) 150 MG 24 hr capsule Take 150 mg by mouth daily. Along with 75 mg  . venlafaxine XR (EFFEXOR-XR) 75 MG 24 hr capsule Take 75 mg by mouth daily at 6 (six) AM. Along with 150 mg     Allergies:   Contrast media [iodinated diagnostic agents], Trintellix [vortioxetine], Dairycare [lactase-lactobacillus], Ciprofloxacin, Codeine, Levaquin [levofloxacin], and Penicillins   Social History   Socioeconomic History  . Marital status: Single    Spouse name: Not on file  . Number of children: Not on file  . Years of education: Not on file  . Highest education level: Not on file  Occupational History  . Not on file  Tobacco Use  . Smoking status: Never Smoker  . Smokeless tobacco: Never Used  Substance and Sexual Activity  . Alcohol use: Never  . Drug use: Never  . Sexual activity: Not on file  Other Topics Concern  . Not on file  Social History Narrative   Tobacco use, amount per day now: None.   Past tobacco use, amount per day: None.   How many years did you use tobacco: None.   Alcohol use (drinks per week): None.   Diet: I eat a low fat diet. Much in vegetables.    Do you drink/eat things with caffeine: Yes.   Marital status:  Divorced                                What year were you married? 1976   Do you live in a house, apartment, assisted living, condo, trailer, etc.? Rent small house.   Is it one or more stories? One.   How many persons live in your home? I live alone.    Do you have pets in your home?( please list) No.   Highest Level of education completed? Masters Degree Plus.   Current or past profession: Pharmacist, hospital.   Do you exercise? Yes.                                  Type and how often? 5 days a week I walk.    Do you have a living will? Yes.   Do you have a DNR form?    Yes                               If not, do you want to discuss one?   Do  you have signed POA/HPOA forms?  Yes.                      If  so, please bring to you appointment      Do you have any difficulty bathing or dressing yourself? No.   Do you have any difficulty preparing food or eating? No.   Do you have any difficulty managing your medications? No.   Do you have any difficulty managing your finances? Yes.   Do you have any difficulty affording your medications? No.   Social Determinants of Health   Financial Resource Strain: Low Risk   . Difficulty of Paying Living Expenses: Not hard at all  Food Insecurity: No Food Insecurity  . Worried About Charity fundraiser in the Last Year: Never true  . Ran Out of Food in the Last Year: Never true  Transportation Needs: No Transportation Needs  . Lack of Transportation (Medical): No  . Lack of Transportation (Non-Medical): No  Physical Activity: Sufficiently Active  . Days of Exercise per Week: 7 days  . Minutes of Exercise per Session: 60 min  Stress: No Stress Concern Present  . Feeling of Stress : Not at all  Social Connections: Moderately Integrated  . Frequency of Communication with Friends and Family: More than three times a week  . Frequency of Social Gatherings with Friends and Family: Twice a week  . Attends Religious Services: More than 4 times per year  . Active Member of Clubs or Organizations: Yes  . Attends Archivist Meetings: More than 4 times per year  . Marital Status: Divorced     Family History: The patient's family history includes Arthritis in her mother; Cancer in her father; Dementia in her father; Diabetes in her brother; Diabetes type II in her son; Heart disease in her mother; Hypertension in her brother, sister, sister, and sister; Thyroid disease in her father. History of coronary artery disease notable for multiple members. History of heart failure notable for multiple members. Mother had CHF and arrhythmia.  ROS:   Please see the history of present illness.     All other systems reviewed and are  negative.  EKGs/Labs/Other Studies Reviewed:    The following studies were reviewed today:  EKG:   05/05/20: SR 95 non-specific TWI 04/15/20: SR 87 nonspecific TWI with baseline artifact   Left/Right Heart Catheterizations: Date: 05/10/20 Results: Conclusions: 1. Minimal luminal irregularity in the proximal RCA.  Left main also has an acute angle without significant stenosis.  Otherwise, no angiographically significant coronary artery disease. 2. Normal left ventricular contraction and filling pressure.    Recent Labs: 10/17/2019: ALT 13; TSH 0.73 05/05/2020: Hemoglobin 12.9; Platelets 239 05/10/2020: BUN 26; Creatinine, Ser 0.76; Potassium 4.3; Sodium 135  Recent Lipid Panel    Component Value Date/Time   CHOL 165 09/25/2018 0825   TRIG 56.0 09/25/2018 0825   HDL 74.50 09/25/2018 0825   CHOLHDL 2 09/25/2018 0825   VLDL 11.2 09/25/2018 0825   LDLCALC 79 09/25/2018 0825   Risk Assessment/Calculations:     The 10-year ASCVD risk score Mikey Bussing DC Brooke Bonito., et al., 2013) is: 24.1%   Values used to calculate the score:     Age: 65 years     Sex: Female     Is Non-Hispanic African American: No     Diabetic: No     Tobacco smoker: No     Systolic Blood Pressure: 299 mmHg  Is BP treated: Yes     HDL Cholesterol: 74.5 mg/dL     Total Cholesterol: 165 mg/dL   Physical Exam:    VS:  BP 130/76   Pulse 95   Ht 5\' 5"  (1.651 m)   Wt 134 lb 12.8 oz (61.1 kg)   SpO2 96%   BMI 22.43 kg/m     Wt Readings from Last 3 Encounters:  05/21/20 134 lb 12.8 oz (61.1 kg)  05/10/20 135 lb (61.2 kg)  05/05/20 135 lb (61.2 kg)    GEN:  Well nourished, well developed in no acute distress HEENT: Normal NECK: No JVD; No carotid bruits LYMPHATICS: No lymphadenopathy CARDIAC: RRR, no murmurs, rubs, gallops; R radial +2 no hematoma RESPIRATORY:  Clear to auscultation without rales, wheezing or rhonchi  ABDOMEN: Soft, non-tender, non-distended MUSCULOSKELETAL:  No edema; No deformity  SKIN:  Warm and dry NEUROLOGIC:  Alert and oriented x 3 PSYCHIATRIC:  Normal affect   ASSESSMENT:    1. Palpitations   2. Primary hypertension   3. Coronary artery disease involving native coronary artery of native heart without angina pectoris    PLAN:    In order of problems listed above:  Coronary Artery Disease; non-obstructive HTN Sudden onset SOB with palpitations - would start ASA - continue norvasc 10 mg and Benicar 20 - will obtain 14-day non live heart monitor (ZioPatch) - Patient will call us with her information from her prior CMR so we can get old records (feels confident this was not a brain MRI) - we will plan to evaluation her for Microvascular disease; if no arrhythmia or other cause of discomfort will try Imdur and BB empirically  Summer follow up unless new symptoms or abnormal test results warranting change in plan  Would be reasonable for  Video Visit Follow up Would be reasonable for  APP Follow up   Medication Adjustments/Labs and Tests Ordered: Current medicines are reviewed at length with the patient today.  Concerns regarding medicines are outlined above.  Orders Placed This Encounter  Procedures  . LONG TERM MONITOR (3-14 DAYS)   No orders of the defined types were placed in this encounter.   Patient Instructions  Medication Instructions:  Your physician recommends that you continue on your current medications as directed. Please refer to the Current Medication list given to you today.  *If you need a refill on your cardiac medications before your next appointment, please call your pharmacy*   Lab Work: NONE If you have labs (blood work) drawn today and your tests are completely normal, you will receive your results only by: Marland Kitchen MyChart Message (if you have MyChart) OR . A paper copy in the mail If you have any lab test that is abnormal or we need to change your treatment, we will call you to review the results.   Testing/Procedures: Your  physician has requested that you wear a 14 day heart monitor.    Follow-Up: At [facility], you and your health needs are our priority.  As part of our continuing mission to provide you with exceptional heart care, we have created designated Provider Care Teams.  These Care Teams include your primary Cardiologist (physician) and Advanced Practice Providers (APPs -  Physician Assistants and Nurse Practitioners) who all work together to provide you with the care you need, when you need it.  We recommend signing up for the patient portal called "MyChart".  Sign up information is provided on this After Visit Summary.  MyChart is used  to connect with patients for Virtual Visits (Telemedicine).  Patients are able to view lab/test results, encounter notes, upcoming appointments, etc.  Non-urgent messages can be sent to your provider as well.   To learn more about what you can do with MyChart, go to [email or web address].    Your next appointment:   4-5 month(s)  The format for your next appointment:   In Person  Provider:   You may see Gasper Sells, MD or one of the following Advanced Practice Providers on your designated Care Team:    Melina Copa, PA-C  Ermalinda Barrios, PA-C    Other Instructions  Bryn Gulling- Long Term Monitor Instructions   Your physician has requested you wear your ZIO patch monitor__14__days.   This is a single patch monitor.  [employer] supplies one patch monitor per enrollment.  Additional stickers are not available.   Please do not apply patch if you will be having a Nuclear Stress Test, Echocardiogram, Cardiac CT, MRI, or Chest Xray during the time frame you would be wearing the monitor. The patch cannot be worn during these tests.  You cannot remove and re-apply the ZIO XT patch monitor.   Your ZIO patch monitor will be sent USPS Priority mail from [facility] directly to your home address. The monitor may also be mailed to a PO BOX if home delivery is not  available.   It may take 3-5 days to receive your monitor after you have been enrolled.   Once you have received you monitor, please review enclosed instructions.  Your monitor has already been registered assigning a specific monitor serial # to you.   Applying the monitor   Shave hair from upper left chest.   Hold abrader disc by orange tab.  Rub abrader in 40 strokes over left upper chest as indicated in your monitor instructions.   Clean area with 4 enclosed alcohol pads .  Use all pads to assure are is cleaned thoroughly.  Let dry.   Apply patch as indicated in monitor instructions.  Patch will be place under collarbone on left side of chest with arrow pointing upward.   Rub patch adhesive wings for 2 minutes.Remove white label marked "1".  Remove white label marked "2".  Rub patch adhesive wings for 2 additional minutes.   While looking in a mirror, press and release button in center of patch.  A small green light will flash 3-4 times .  This will be your only indicator the monitor has been turned on.     Do not shower for the first 24 hours.  You may shower after the first 24 hours.   Press button if you feel a symptom. You will hear a small click.  Record Date, Time and Symptom in the Patient Log Book.   When you are ready to remove patch, follow instructions on last 2 pages of Patient Log Book.  Stick patch monitor onto last page of Patient Log Book.   Place Patient Log Book in [place] box.  Use locking tab on box and tape box closed securely.  The [employer] has [employer] on it.  Please place in mailbox as soon as possible.  Your physician should have your test results approximately 7 days after the monitor has been mailed back to [facility].   Call [place] at [phone] if you have questions regarding your ZIO XT patch monitor.  Call them immediately if you see an orange light blinking on your monitor.  If your monitor falls off in less  than 4 days contact our Monitor department at [phone].  If your monitor becomes loose or falls off after 4 days call [employer] at [phone] for suggestions on securing your monitor.       Signed, Werner Lean, MD  05/21/2020 3:18 PM    [place]

## 2020-05-21 NOTE — Patient Instructions (Addendum)
Medication Instructions:  Your physician recommends that you continue on your current medications as directed. Please refer to the Current Medication list given to you today.  *If you need a refill on your cardiac medications before your next appointment, please call your pharmacy*   Lab Work: NONE If you have labs (blood work) drawn today and your tests are completely normal, you will receive your results only by: Marland Kitchen MyChart Message (if you have MyChart) OR . A paper copy in the mail If you have any lab test that is abnormal or we need to change your treatment, we will call you to review the results.   Testing/Procedures: Your physician has requested that you wear a 14 day heart monitor.    Follow-Up: At [facility], you and your health needs are our priority.  As part of our continuing mission to provide you with exceptional heart care, we have created designated Provider Care Teams.  These Care Teams include your primary Cardiologist (physician) and Advanced Practice Providers (APPs -  Physician Assistants and Nurse Practitioners) who all work together to provide you with the care you need, when you need it.  We recommend signing up for the patient portal called "MyChart".  Sign up information is provided on this After Visit Summary.  MyChart is used to connect with patients for Virtual Visits (Telemedicine).  Patients are able to view lab/test results, encounter notes, upcoming appointments, etc.  Non-urgent messages can be sent to your provider as well.   To learn more about what you can do with MyChart, go to [email or web address].    Your next appointment:   4-5 month(s)  The format for your next appointment:   In Person  Provider:   You may see Jackie Sells, MD or one of the following Advanced Practice Providers on your designated Care Team:    Melina Copa, PA-C  Ermalinda Barrios, PA-C    Other Instructions  Bryn Gulling- Long Term Monitor Instructions   Your physician  has requested you wear your ZIO patch monitor__14__days.   This is a single patch monitor.  [employer] supplies one patch monitor per enrollment.  Additional stickers are not available.   Please do not apply patch if you will be having a Nuclear Stress Test, Echocardiogram, Cardiac CT, MRI, or Chest Xray during the time frame you would be wearing the monitor. The patch cannot be worn during these tests.  You cannot remove and re-apply the ZIO XT patch monitor.   Your ZIO patch monitor will be sent USPS Priority mail from [facility] directly to your home address. The monitor may also be mailed to a PO BOX if home delivery is not available.   It may take 3-5 days to receive your monitor after you have been enrolled.   Once you have received you monitor, please review enclosed instructions.  Your monitor has already been registered assigning a specific monitor serial # to you.   Applying the monitor   Shave hair from upper left chest.   Hold abrader disc by orange tab.  Rub abrader in 40 strokes over left upper chest as indicated in your monitor instructions.   Clean area with 4 enclosed alcohol pads .  Use all pads to assure are is cleaned thoroughly.  Let dry.   Apply patch as indicated in monitor instructions.  Patch will be place under collarbone on left side of chest with arrow pointing upward.   Rub patch adhesive wings for 2 minutes.Remove white label marked "1".  Remove white label marked "2".  Rub patch adhesive wings for 2 additional minutes.   While looking in a mirror, press and release button in center of patch.  A small green light will flash 3-4 times .  This will be your only indicator the monitor has been turned on.     Do not shower for the first 24 hours.  You may shower after the first 24 hours.   Press button if you feel a symptom. You will hear a small click.  Record Date, Time and Symptom in the Patient Log Book.   When you are ready to remove patch, follow  instructions on last 2 pages of Patient Log Book.  Stick patch monitor onto last page of Patient Log Book.   Place Patient Log Book in [place] box.  Use locking tab on box and tape box closed securely.  The [employer] and [employer] has [employer] on it.  Please place in mailbox as soon as possible.  Your physician should have your test results approximately 7 days after the monitor has been mailed back to [facility].   Call [place] at [phone] if you have questions regarding your ZIO XT patch monitor.  Call them immediately if you see an orange light blinking on your monitor.   If your monitor falls off in less than 4 days contact our Monitor department at [phone].  If your monitor becomes loose or falls off after 4 days call [employer] at [phone] for suggestions on securing your monitor.

## 2020-05-28 ENCOUNTER — Encounter: Payer: Self-pay | Admitting: Orthopedic Surgery

## 2020-05-28 ENCOUNTER — Other Ambulatory Visit: Payer: Self-pay

## 2020-05-28 ENCOUNTER — Ambulatory Visit (INDEPENDENT_AMBULATORY_CARE_PROVIDER_SITE_OTHER): Payer: Medicare Other | Admitting: Orthopedic Surgery

## 2020-05-28 VITALS — BP 120/78 | HR 90 | Temp 98.1°F | Resp 20 | Ht 65.0 in | Wt 137.8 lb

## 2020-05-28 DIAGNOSIS — F338 Other recurrent depressive disorders: Secondary | ICD-10-CM

## 2020-05-28 DIAGNOSIS — R002 Palpitations: Secondary | ICD-10-CM

## 2020-05-28 DIAGNOSIS — M25562 Pain in left knee: Secondary | ICD-10-CM

## 2020-05-28 DIAGNOSIS — R2681 Unsteadiness on feet: Secondary | ICD-10-CM

## 2020-05-28 DIAGNOSIS — M810 Age-related osteoporosis without current pathological fracture: Secondary | ICD-10-CM

## 2020-05-28 DIAGNOSIS — M25561 Pain in right knee: Secondary | ICD-10-CM

## 2020-05-28 DIAGNOSIS — R42 Dizziness and giddiness: Secondary | ICD-10-CM

## 2020-05-28 DIAGNOSIS — E063 Autoimmune thyroiditis: Secondary | ICD-10-CM

## 2020-05-28 DIAGNOSIS — R413 Other amnesia: Secondary | ICD-10-CM | POA: Diagnosis not present

## 2020-05-28 DIAGNOSIS — E038 Other specified hypothyroidism: Secondary | ICD-10-CM

## 2020-05-28 DIAGNOSIS — F319 Bipolar disorder, unspecified: Secondary | ICD-10-CM

## 2020-05-28 DIAGNOSIS — D510 Vitamin B12 deficiency anemia due to intrinsic factor deficiency: Secondary | ICD-10-CM

## 2020-05-28 DIAGNOSIS — I10 Essential (primary) hypertension: Secondary | ICD-10-CM | POA: Diagnosis not present

## 2020-05-28 DIAGNOSIS — R000 Tachycardia, unspecified: Secondary | ICD-10-CM

## 2020-05-28 MED ORDER — CYANOCOBALAMIN 1000 MCG/ML IJ SOLN: INTRAMUSCULAR | 3 refills | Status: DC

## 2020-05-28 MED ORDER — LEVOTHYROXINE SODIUM 125 MCG PO TABS
ORAL_TABLET | ORAL | 1 refills | Status: DC
Start: 2020-05-28 — End: 2020-09-02

## 2020-05-28 MED ORDER — OLMESARTAN MEDOXOMIL 20 MG PO TABS: ORAL_TABLET | ORAL | 1 refills | Status: DC

## 2020-05-28 MED ORDER — AMLODIPINE BESYLATE 10 MG PO TABS
ORAL_TABLET | ORAL | 1 refills | Status: DC
Start: 2020-05-28 — End: 2020-12-09

## 2020-05-28 MED ORDER — CELECOXIB 50 MG PO CAPS: 50.0000 mg | ORAL_CAPSULE | Freq: Two times a day (BID) | ORAL | 1 refills | Status: DC

## 2020-05-28 NOTE — Patient Instructions (Signed)
Will order PT for balance issues Please start taking Caltrate for bone health May take tylenol 1000 mg twice daily- may do extra dose    Joint Pain  Joint pain can be caused by many things. It is likely to go away if you follow instructions from your doctor for taking care of yourself at home. Sometimes, you may need more treatment. Follow these instructions at home: Managing pain, stiffness, and swelling  If told, put ice on the painful area. To do this: ? If you have a removable elastic bandage, sling, or splint, take it off as told by your doctor. ? Put ice in a plastic bag. ? Place a towel between your skin and the bag. ? Leave the ice on for 20 minutes, 2-3 times a day. ? Take off the ice if your skin turns bright red. This is very important. If you cannot feel pain, heat, or cold, you have a greater risk of damage to the area.  Move your fingers or toes below the painful joint often.  Raise the painful joint above the level of your heart while you are sitting or lying down.  If told, put heat on the painful area. Do this as often as told by your doctor. Use the heat source that your doctor recommends, such as a moist heat pack or a heating pad. ? Place a towel between your skin and the heat source. ? Leave the heat on for 20-30 minutes. ? Take off the heat if your skin gets bright red. This is especially important if you are unable to feel pain, heat, or cold. You may have a greater risk of getting burned.      Activity  Rest the painful joint for as long as told by your doctor. Do not do things that cause pain or make your pain worse.  Begin exercising or stretching the affected area, as told by your doctor. Ask your doctor what types of exercise are safe for you.  Return to your normal activities when your doctor says that it is safe. If you have an elastic bandage, sling, or splint:  Wear it as told by your doctor. Take it only as told by your doctor.  Loosen it your  fingers or toes below the joint: ? Tingle. ? Become numb. ? Get cold and blue.  Keep it clean.  Ask your doctor if you should take it off before bathing.  If it is not waterproof: ? Do not let it get wet. ? Cover it with a watertight covering when you take a bath or shower. General instructions  Take over-the-counter and prescription medicines only as told by your doctor. This may include medicines taken by mouth or applied to the skin.  Do not smoke or use any products that contain nicotine or tobacco. If you need help quitting, ask your doctor.  Keep all follow-up visits as told by your doctor. This is important. Contact a doctor if:  You have pain that gets worse and does not get better with medicine.  Your joint pain does not get better in 3 days.  You have more bruising or swelling.  You have a fever.  You lose 10 lb (4.5 kg) or more without trying. Get help right away if:  You cannot move the joint.  Your fingers or toes tingle, become numb. or get cold and blue.  You have a fever along with a joint that is red, warm, and swollen. Summary  Joint pain can be  caused by many things. It often goes away if you follow instructions from your doctor for taking care of yourself at home.  Rest the painful joint for as long as told. Do not do things that cause pain or make your pain worse.  Take over-the-counter and prescription medicines only as told by your doctor. This information is not intended to replace advice given to you by your health care provider. Make sure you discuss any questions you have with your health care provider. Document Revised: 06/18/2019 Document Reviewed: 06/18/2019 Elsevier Patient Education  2021 [employer].
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
## 2020-06-03 ENCOUNTER — Ambulatory Visit (INDEPENDENT_AMBULATORY_CARE_PROVIDER_SITE_OTHER): Payer: Medicare Other | Admitting: Psychology

## 2020-06-03 DIAGNOSIS — F332 Major depressive disorder, recurrent severe without psychotic features: Secondary | ICD-10-CM | POA: Diagnosis not present

## 2020-06-08 DIAGNOSIS — R002 Palpitations: Secondary | ICD-10-CM | POA: Diagnosis not present

## 2020-06-09 ENCOUNTER — Encounter: Payer: Self-pay | Admitting: Orthopedic Surgery

## 2020-06-21 ENCOUNTER — Encounter: Payer: Self-pay | Admitting: Orthopedic Surgery

## 2020-06-24 ENCOUNTER — Other Ambulatory Visit: Payer: Self-pay

## 2020-06-24 ENCOUNTER — Ambulatory Visit: Payer: Medicare Other | Attending: Orthopedic Surgery | Admitting: Physical Therapy

## 2020-06-24 ENCOUNTER — Encounter: Payer: Self-pay | Admitting: Physical Therapy

## 2020-06-24 ENCOUNTER — Ambulatory Visit: Payer: Medicare Other | Admitting: Psychology

## 2020-06-24 ENCOUNTER — Ambulatory Visit: Payer: Medicare Other | Attending: Internal Medicine

## 2020-06-24 DIAGNOSIS — R262 Difficulty in walking, not elsewhere classified: Secondary | ICD-10-CM | POA: Diagnosis not present

## 2020-06-24 DIAGNOSIS — M25551 Pain in right hip: Secondary | ICD-10-CM

## 2020-06-24 DIAGNOSIS — M6281 Muscle weakness (generalized): Secondary | ICD-10-CM | POA: Diagnosis not present

## 2020-06-24 DIAGNOSIS — M25552 Pain in left hip: Secondary | ICD-10-CM | POA: Insufficient documentation

## 2020-06-24 DIAGNOSIS — Z23 Encounter for immunization: Secondary | ICD-10-CM

## 2020-06-24 DIAGNOSIS — R2681 Unsteadiness on feet: Secondary | ICD-10-CM | POA: Insufficient documentation

## 2020-06-24 NOTE — Therapy (Addendum)
[facility] [address], [place], [place], [address] Phone: [phone]   Fax:  [phone]  Physical Therapy Evaluation  Patient Details  Name: Jackie Jones MRN: [id number] Date of Birth: April 08, 1945 Referring Provider (PT): Windell Moulding NP   Encounter Date: 06/24/2020    PT End of Session - 06/24/20 1507    Visit Number 1    Date for PT Re-Evaluation 09/16/20    Authorization Type Medicare    Progress Note Due on Visit 10    PT Start Time 1400   arrives 1 hour early due to SCAT   PT Stop Time 1455    PT Time Calculation (min) 55 min    Activity Tolerance Patient tolerated treatment well           Past Medical History:  Diagnosis Date  . Allergy   . Arthritis   . Bipolar 1 disorder, depressed ([place])   . Bipolar disorder ([place])   . Chicken pox   . Dementia ([place])   . Depression   . Hashimoto's thyroiditis   . History of blood transfusion   . History of bone density study 2019  . History of colonoscopy 2015  . History of CT scan 2019  . History of mammogram 2019  . History of MRI   . History of Papanicolaou smear of cervix   . Hx: UTI (urinary tract infection)   . Hypertension   . Hypothyroidism   . Lactose intolerance   . Legally blind in left eye, as defined in [place]   . Non-celiac gluten sensitivity   . Osteoporosis   . Pernicious anemia   . Pernicious anemia   . Rheumatic fever   . Thyroid disease   . Urinary and fecal incontinence     Past Surgical History:  Procedure Laterality Date  . ABDOMINAL HYSTERECTOMY     1981  . APPENDECTOMY  1981  . LEFT HEART CATH AND CORONARY ANGIOGRAPHY N/A 05/10/2020   Procedure: LEFT HEART CATH AND CORONARY ANGIOGRAPHY;  Surgeon: Nelva Bush, MD;  Location: [facility];  Service: Cardiovascular;  Laterality: N/A;  . prolapsed rectum 2015    . TONSILLECTOMY AND ADENOIDECTOMY  1966  . TUBAL LIGATION      There were no vitals filed for this  visit.    Subjective Assessment - 06/24/20 1410    Subjective Had a fall in November while waiting for the bus.  Prior to that walking 4 miles.  No assistive device previously.  Just moved to assisted living.  Now uses RW full time.    Pertinent History goes by either "[place]" or "[name]";  heart arthymia; HTN; multi joint OA especially shoulders, knees and hips; scoliosis with signifcant leg length discrepancy; Bipolar 1    Limitations Walking;House hold activities;Standing    How long can you walk comfortably? 1/10 mile to dining room 1-3x/day I can do that without resting    Patient Stated Goals I'd like to walk again (eventually walked 4 miles); get strength in legs for steps (my sister had good PT and taught me to do 1 inch step ups on book.)  Move around safely    Currently in Pain? Yes    Pain Score 8    with walking   Pain Location Leg    Pain Orientation Right;Left    Pain Type Chronic pain    Aggravating Factors  walking, standing    Pain Relieving Factors Celebrex helps 1 hour; acetaminophen; sitting  down              [facility] PT Assessment - 06/24/20 0001      Assessment   Medical Diagnosis unsteady gait    Referring Provider (PT) Windell Moulding NP    Onset Date/Surgical Date --   November   Next MD Visit physical in June    Prior Therapy had PT for balance in the past but this problem is much worse now      Precautions   Precautions Fall      Restrictions   Weight Bearing Restrictions No      Balance Screen   Has the patient fallen in the past 6 months No    Has the patient had a decrease in activity level because of a fear of falling?  Yes    Is the patient reluctant to leave their home because of a fear of falling?  No      Home Environment   Living Environment Assisted living    [place] - 2 wheels;[place] - single point      Prior Function   Level of Independence Independent with household mobility with device    Vocation Retired   Runner, broadcasting/film/video  Requirements lived in [place] from birth to age 75      Posture/Postural Control   Posture Comments knee valgus; right LE significantly shorter in stance;  audible crepitus in both hips with every step and with sit to stand      AROM   Overall AROM Comments UEs grossly 150 degrees elevation; decreased motor control with abduction lowering; LEs WFLs      Strength   Overall Strength Comments LEs grossly 4-/5; needs mod assist of UEs with sit to stand; UEs grossly 3+ to 4-/5      Ambulation/Gait   Pre-Gait Activities RW    Gait Comments pt initially came in with wheel on the back of walker and she tips the walker back on these 2 wheels for ambulation;  therapist switched the wheels to the front and shortened the height to the appropriate level;  also discussed tennis balls for the back legs or "skis" on back legs for easier glide      Standardized Balance Assessment   Five times sit to stand comments  27.38 sec UE assist mod      Berg Balance Test   Sit to Stand Able to stand  independently using hands    Standing Unsupported Able to stand safely 2 minutes    Sitting with Back Unsupported but Feet Supported on Floor or Stool Able to sit safely and securely 2 minutes    Stand to Sit Controls descent by using hands    Transfers Able to transfer safely, definite need of hands    Standing Unsupported with Eyes Closed Able to stand 3 seconds    Standing Unsupported with Feet Together Needs help to attain position but able to stand for 30 seconds with feet together    From Standing, Reach Forward with Outstretched Arm Can reach forward >5 cm safely (2")    From Standing Position, Pick up Object from [place] to pick up shoe safely and easily    From Standing Position, Turn to Look Behind Over each Shoulder Looks behind one side only/other side shows less weight shift    Turn 360 Degrees Able to turn 360 degrees safely but slowly    Standing Unsupported, Alternately Place Feet on Step/Stool Able  to complete >  2 steps/needs minimal assist    Standing Unsupported, One Foot in Front Able to take small step independently and hold 30 seconds    Standing on One Leg Tries to lift leg/unable to hold 3 seconds but remains standing independently    Total Score 35      Timed Up and Go Test   Normal TUG (seconds) 29.23                      Objective measurements completed on examination: See above findings.               PT Education - 06/24/20 1507    Education Details sit to stand from a very high chair; RW adjustments for safety    Person(s) Educated Patient    Methods Explanation;Handout    Comprehension Returned demonstration;Verbalized understanding            PT Short Term Goals - 06/24/20 1524      PT SHORT TERM GOAL #1   Title The patient will be able to walk 240 feet with RW needed for improved mobility at her new assisted living residence    Time 6    Period Weeks    Status New    Target Date 08/05/20      PT SHORT TERM GOAL #2   Title The patient will have improved LE strength with the ability to rise from a standard chair with min UE assist    Time 6    Period Weeks    Status New      PT SHORT TERM GOAL #3   Title TUG test improved to 25 sec indicating improved gait speed    Time 6    Period Weeks    Status New      PT SHORT TERM GOAL #4   Title BERG balance test improved to 40/56 indicating decreased fall risk    Time 6    Period Weeks    Status New             PT Long Term Goals - 06/24/20 1527      PT LONG TERM GOAL #1   Title The patient will be independent with safe self progression of HEP and initiation of group ex program in community    Time 12    Period Weeks    Status New    Target Date 09/16/20      PT LONG TERM GOAL #2   Title The patient will be able to complete a 6 minute walk test and/or 480 feet with RW    Time 12    Period Weeks    Status New      PT LONG TERM GOAL #3   Title The patient will have  LE strength to grossly 4/5 needed to rise from a standard chair 1x without UE assist    Time 12    Period Weeks    Status New      PT LONG TERM GOAL #4   Title BERG balance score improved to 43/56 indicating decreased risk of falls    Time 12    Period Weeks    Status New      PT LONG TERM GOAL #5   Title Timed up and Go score improved to 22 sec or less    Time 12    Period Weeks    Status New      Additional Long Term Goals   Additional  Long Term Goals Yes      PT LONG TERM GOAL #6   Title 5x sit to stand with light UE assist in 21 sec or less    Time 12    Period Weeks    Status New                  Plan - 06/24/20 1457    Clinical Impression Statement The patient is a pleasant 75 year old with multi region pain referred to PT for unsteady gait.  She has numerous orthopedic issues including a progressively worsening scoliosis causing a right LE shorter leg length discrepancy, as well as bil knee and hip OA.  She has audible crepitus in her hips with every step while walking and with sit to stand.  She is using a RW but the wheels are on the back legs and she therefore tips the walker backward to walk.  Therapist transferred the wheels to the front and adjusted the height for her stature and she is able to walk faster and with less joint pain.    She just moved into an assistive living facility from a rental home. She states she is able to walk from her room to the dining room without stopping now.  UE and LE ROM WFLS.  UE and LE strength grossly 3+/5 to 4-/5.  She is dependent on UEs to rise sit to stand and to control sitting down.  5x sit to stand and TUG test times are quite slow indicating a higher risk of falls.  BERG balance test is 35/56 indicating a near 100% risk of falls.  She is highly motivated to initiate an ex program and an aquatic PT would be particularly helpful due to her extensive joint pain.    Personal Factors and Comorbidities Age;Comorbidity  1;Comorbidity 2;Comorbidity 3+;Transportation;Time since onset of injury/illness/exacerbation    Comorbidities scoliosis with leg length discrepancy; bil knee/hip OA; cardiac history; HTN; reports incontinence issues related to cervical issues but states she can make adjustments in order to do pool therapy;    Examination-Activity Limitations Locomotion Level;Transfers;Lift;Hygiene/Grooming;Stand;Stairs;Sleep    Examination-Participation Restrictions Cleaning;Community Activity;Shop;Meal Prep    Stability/Clinical Decision Making Evolving/Moderate complexity    Clinical Decision Making Moderate    Rehab Potential Good    PT Frequency 2x / week    PT Duration 12 weeks    PT Treatment/Interventions ADLs/Self Care Home Management;Aquatic Therapy;Electrical Stimulation;Cryotherapy;Moist Heat;Therapeutic activities;Therapeutic exercise;Neuromuscular re-education;Manual techniques;Patient/family education;Taping    PT Next Visit Plan review sit to stand from chair with 2 cushions;  Nu-Step; combo low level seated and standing strengthening and balance ex's;  aquatic PT when available (wait list)    PT Home Exercise Plan [email or web address]    Recommended Other Services possible orthotist referral for elevated shoe on right    Consulted and Agree with Plan of Care Patient           Patient will benefit from skilled therapeutic intervention in order to improve the following deficits and impairments:  Difficulty walking,Pain,Decreased balance,Decreased strength,Impaired perceived functional ability  Visit Diagnosis: Unsteady gait - Plan: PT plan of care cert/re-cert  Muscle weakness (generalized) - Plan: PT plan of care cert/re-cert  Pain in left hip - Plan: PT plan of care cert/re-cert  Pain in right hip - Plan: PT plan of care cert/re-cert  Difficulty in walking, not elsewhere classified - Plan: PT plan of care cert/re-cert     Problem List Patient Active Problem List   Diagnosis Date Noted  .  Palpitations 05/21/2020  . Coronary artery disease involving native coronary artery of native heart without angina pectoris 05/21/2020  . Near syncope 05/05/2020  . Seasonal affective disorder ([place]) 04/22/2020  . Bipolar 1 disorder, depressed ([place]) 03/01/2018  . HTN (hypertension) 03/01/2018  . Hypothyroid 03/01/2018  . Pernicious anemia 03/01/2018  . Osteoporosis 03/01/2018  . Macular degeneration 03/01/2018   Ruben Im, PT 06/24/20 5:19 PM Phone: [phone] Fax: [phone] Alvera Singh 06/24/2020, 5:19 PM  [facility] [address], [place], [place], [address] Phone: [phone]   Fax:  [phone]  Name: Jackie Jones MRN: [id number] Date of Birth: February 19, 1946

## 2020-06-24 NOTE — Patient Instructions (Signed)
Access Code: [email or web address] URL: [email or web address] Date: 06/24/2020 Prepared by: Ruben Im  Exercises Sit to Stand - 1 x daily - 7 x weekly - 2 sets - 5 reps

## 2020-06-24 NOTE — Progress Notes (Signed)
   Covid-19 Vaccination Clinic  Name:  Nayeliz Hipp    MRN: [id number] DOB: 24-Mar-1945  06/24/2020  Ms. Kalisz was observed post Covid-19 immunization for 15 minutes without incident. She was provided with Vaccine Information Sheet and instruction to access the V-Safe system.   Ms. Schrecengost was instructed to call 911 with any severe reactions post vaccine: Marland Kitchen Difficulty breathing  . Swelling of face and throat  . A fast heartbeat  . A bad rash all over body  . Dizziness and weakness   Immunizations Administered    Name Date Dose VIS Date Route   Moderna Covid-19 Booster Vaccine 06/24/2020  9:47 AM 0.25 mL 01/07/2020 Intramuscular   Manufacturer: Levan Hurst   Lot: [id number]   [place]: [id number]

## 2020-06-25 DIAGNOSIS — F3131 Bipolar disorder, current episode depressed, mild: Secondary | ICD-10-CM | POA: Diagnosis not present

## 2020-06-29 ENCOUNTER — Ambulatory Visit: Payer: Medicare Other | Admitting: Physical Therapy

## 2020-06-29 ENCOUNTER — Other Ambulatory Visit: Payer: Self-pay

## 2020-06-29 DIAGNOSIS — R262 Difficulty in walking, not elsewhere classified: Secondary | ICD-10-CM

## 2020-06-29 DIAGNOSIS — M6281 Muscle weakness (generalized): Secondary | ICD-10-CM

## 2020-06-29 DIAGNOSIS — M25551 Pain in right hip: Secondary | ICD-10-CM | POA: Diagnosis not present

## 2020-06-29 DIAGNOSIS — R2681 Unsteadiness on feet: Secondary | ICD-10-CM | POA: Diagnosis not present

## 2020-06-29 DIAGNOSIS — M25552 Pain in left hip: Secondary | ICD-10-CM | POA: Diagnosis not present

## 2020-06-29 NOTE — Patient Instructions (Signed)
Access Code: [email or web address] URL: [email or web address] Date: 06/29/2020 Prepared by: Jari Favre  Exercises Sit to Stand - 1 x daily - 7 x weekly - 2 sets - 5 reps Stride Stance Weight Shift - 1 x daily - 7 x weekly - 3 sets - 10 reps Seated March - 1 x daily - 7 x weekly - 3 sets - 10 reps Seated Long Arc Quad - 1 x daily - 7 x weekly - 3 sets - 10 reps

## 2020-06-29 NOTE — Therapy (Signed)
[facility] [address] [place], [place], [address] Phone: [phone]   Fax:  [phone]  Physical Therapy Treatment  Patient Details  Name: Jackie Jones MRN: [id number] Date of Birth: [birth year] Referring Provider (PT): Windell Moulding NP   Encounter Date: 06/29/2020   PT End of Session - 06/29/20 1019    Visit Number 2    Date for PT Re-Evaluation 09/16/20    Authorization Type Medicare    PT Start Time 2353    PT Stop Time 1055    PT Time Calculation (min) 40 min    Activity Tolerance Patient tolerated treatment well    Behavior During Therapy [facility] for tasks assessed/performed           Past Medical History:  Diagnosis Date  . Allergy   . Arthritis   . Bipolar 1 disorder, depressed ([place])   . Bipolar disorder ([place])   . Chicken pox   . Dementia ([place])   . Depression   . Hashimoto's thyroiditis   . History of blood transfusion   . History of bone density study 2019  . History of colonoscopy 2015  . History of CT scan 2019  . History of mammogram 2019  . History of MRI   . History of Papanicolaou smear of cervix   . Hx: UTI (urinary tract infection)   . Hypertension   . Hypothyroidism   . Lactose intolerance   . Legally blind in left eye, as defined in [place]   . Non-celiac gluten sensitivity   . Osteoporosis   . Pernicious anemia   . Pernicious anemia   . Rheumatic fever   . Thyroid disease   . Urinary and fecal incontinence     Past Surgical History:  Procedure Laterality Date  . ABDOMINAL HYSTERECTOMY     1981  . APPENDECTOMY  1981  . LEFT HEART CATH AND CORONARY ANGIOGRAPHY N/A 05/10/2020   Procedure: LEFT HEART CATH AND CORONARY ANGIOGRAPHY;  Surgeon: Nelva Bush, MD;  Location: [facility];  Service: Cardiovascular;  Laterality: N/A;  . prolapsed rectum 2015    . TONSILLECTOMY AND ADENOIDECTOMY  1966  . TUBAL LIGATION      There were no vitals filed for this  visit.   Subjective Assessment - 06/29/20 1045    Subjective STates she was able to walk more since last visit and felt good about that.  pt is not sure about the sit to stand and needs to review    Pertinent History goes by either "Jackie Jones" or "Jackie Jones";  heart arthymia; HTN; multi joint OA especially shoulders, knees and hips; scoliosis with signifcant leg length discrepancy; Bipolar 1    Limitations Walking;House hold activities;Standing    Currently in Pain? Yes    Pain Score 7     Pain Location Leg    Pain Orientation Right;Left    Pain Type Chronic pain    Pain Onset More than a month ago    Pain Frequency Intermittent    Aggravating Factors  walking and standing    Multiple Pain Sites No                             [facility] Adult PT Treatment/Exercise - 06/29/20 0001      Exercises   Exercises Knee/Hip      Knee/Hip Exercises: Aerobic   Nustep L3 x 10 min LE only for endurance and strengthening LE  Knee/Hip Exercises: Standing   Heel Raises Both;10 reps;2 sets   UE support - shifting weight onto one side x 2 sets   Other Standing Knee Exercises weight shift in staggared stance both ways      Knee/Hip Exercises: Seated   Long Arc Quad Strengthening;10 reps    Marching Strengthening;Both;20 reps   tap stool   Sit to Sand 10 reps;without UE support   chair and 2 pads                 PT Education - 06/29/20 1057    Education Details Access Code: [email or web address]    Person(s) Educated Patient    Methods Explanation;Demonstration;Tactile cues;Verbal cues;Handout    Comprehension Verbalized understanding;Returned demonstration            PT Short Term Goals - 06/24/20 1524      PT SHORT TERM GOAL #1   Title The patient will be able to walk 240 feet with RW needed for improved mobility at her new assisted living residence    Time 6    Period Weeks    Status New    Target Date 08/05/20      PT SHORT TERM GOAL #2   Title The patient will have  improved LE strength with the ability to rise from a standard chair with min UE assist    Time 6    Period Weeks    Status New      PT SHORT TERM GOAL #3   Title TUG test improved to 25 sec indicating improved gait speed    Time 6    Period Weeks    Status New      PT SHORT TERM GOAL #4   Title BERG balance test improved to 40/56 indicating decreased fall risk    Time 6    Period Weeks    Status New             PT Long Term Goals - 06/24/20 1527      PT LONG TERM GOAL #1   Title The patient will be independent with safe self progression of HEP and initiation of group ex program in community    Time 12    Period Weeks    Status New    Target Date 09/16/20      PT LONG TERM GOAL #2   Title The patient will be able to complete a 6 minute walk test and/or 480 feet with RW    Time 12    Period Weeks    Status New      PT LONG TERM GOAL #3   Title The patient will have LE strength to grossly 4/5 needed to rise from a standard chair 1x without UE assist    Time 12    Period Weeks    Status New      PT LONG TERM GOAL #4   Title BERG balance score improved to 43/56 indicating decreased risk of falls    Time 12    Period Weeks    Status New      PT LONG TERM GOAL #5   Title Timed up and Go score improved to 22 sec or less    Time 12    Period Weeks    Status New      Additional Long Term Goals   Additional Long Term Goals Yes      PT LONG TERM GOAL #6   Title 5x sit to stand with  light UE assist in 21 sec or less    Time 12    Period Weeks    Status New                 Plan - 06/29/20 1057    Clinical Impression Statement Pt did well during treatment and reports she did not have increased pain.  Pt needed cues for sit to stand to keep from bringing knees together for stability and to lower down slowly to the chair.  pt still needs 2 cushions.  Able to add to HEP today with weight shifting and needs UE support x 2.  Pt will benefit from skilled PT to  continue to address strength and stability for reduced risk of falls and improved mobility    PT Treatment/Interventions ADLs/Self Care Home Management;Aquatic Therapy;Electrical Stimulation;Cryotherapy;Moist Heat;Therapeutic activities;Therapeutic exercise;Neuromuscular re-education;Manual techniques;Patient/family education;Taping    PT Next Visit Plan review sit to stand from chair with 2 cushions;  Nu-Step; combo low level seated and standing strengthening and balance ex's;  aquatic PT when available (wait list)    PT Home Exercise Plan [email or web address]    Consulted and Agree with Plan of Care Patient           Patient will benefit from skilled therapeutic intervention in order to improve the following deficits and impairments:  Difficulty walking,Pain,Decreased balance,Decreased strength,Impaired perceived functional ability  Visit Diagnosis: Unsteady gait  Muscle weakness (generalized)  Pain in left hip  Pain in right hip  Difficulty in walking, not elsewhere classified     Problem List Patient Active Problem List   Diagnosis Date Noted  . Palpitations 05/21/2020  . Coronary artery disease involving native coronary artery of native heart without angina pectoris 05/21/2020  . Near syncope 05/05/2020  . Seasonal affective disorder ([place]) 04/22/2020  . Bipolar 1 disorder, depressed ([place]) 03/01/2018  . HTN (hypertension) 03/01/2018  . Hypothyroid 03/01/2018  . Pernicious anemia 03/01/2018  . Osteoporosis 03/01/2018  . Macular degeneration 03/01/2018    Jule Ser, PT 06/29/2020, 11:46 AM  [facility] [address] [place], [place], [address] Phone: [phone]   Fax:  [phone]  Name: Jackie Jones MRN: [id number] Date of Birth: 01/27/1946

## 2020-07-01 ENCOUNTER — Other Ambulatory Visit (HOSPITAL_BASED_OUTPATIENT_CLINIC_OR_DEPARTMENT_OTHER): Payer: Self-pay

## 2020-07-01 MED ORDER — COVID-19 MRNA VACC (MODERNA) 100 MCG/0.5ML IM SUSP
INTRAMUSCULAR | 0 refills | Status: DC
  Filled 2020-07-01: qty 0.25, 1d supply, fill #0

## 2020-07-08 DIAGNOSIS — L300 Nummular dermatitis: Secondary | ICD-10-CM | POA: Diagnosis not present

## 2020-07-08 DIAGNOSIS — M67449 Ganglion, unspecified hand: Secondary | ICD-10-CM | POA: Diagnosis not present

## 2020-07-08 DIAGNOSIS — L814 Other melanin hyperpigmentation: Secondary | ICD-10-CM | POA: Diagnosis not present

## 2020-07-13 ENCOUNTER — Other Ambulatory Visit: Payer: Self-pay

## 2020-07-13 ENCOUNTER — Ambulatory Visit: Payer: Medicare Other

## 2020-07-13 DIAGNOSIS — M25551 Pain in right hip: Secondary | ICD-10-CM

## 2020-07-13 DIAGNOSIS — R262 Difficulty in walking, not elsewhere classified: Secondary | ICD-10-CM | POA: Diagnosis not present

## 2020-07-13 DIAGNOSIS — R2681 Unsteadiness on feet: Secondary | ICD-10-CM | POA: Diagnosis not present

## 2020-07-13 DIAGNOSIS — M6281 Muscle weakness (generalized): Secondary | ICD-10-CM | POA: Diagnosis not present

## 2020-07-13 DIAGNOSIS — M25552 Pain in left hip: Secondary | ICD-10-CM

## 2020-07-13 NOTE — Therapy (Signed)
[facility] [address], [place], [place], [address] Phone: [phone]   Fax:  [phone]  Physical Therapy Treatment  Patient Details  Name: Jackie Jones MRN: [id number] Date of Birth: [birth year] Referring Provider (PT): Windell Moulding NP   Encounter Date: 07/13/2020   PT End of Session - 07/13/20 0934    Visit Number 3    Date for PT Re-Evaluation 09/16/20    Authorization Type Medicare    Progress Note Due on Visit 10    PT Start Time 0852    PT Stop Time 0932    PT Time Calculation (min) 40 min    Activity Tolerance Patient tolerated treatment well    Behavior During Therapy [facility] for tasks assessed/performed           Past Medical History:  Diagnosis Date  . Allergy   . Arthritis   . Bipolar 1 disorder, depressed ([place])   . Bipolar disorder ([place])   . Chicken pox   . Dementia ([place])   . Depression   . Hashimoto's thyroiditis   . History of blood transfusion   . History of bone density study 2019  . History of colonoscopy 2015  . History of CT scan 2019  . History of mammogram 2019  . History of MRI   . History of Papanicolaou smear of cervix   . Hx: UTI (urinary tract infection)   . Hypertension   . Hypothyroidism   . Lactose intolerance   . Legally blind in left eye, as defined in [place]   . Non-celiac gluten sensitivity   . Osteoporosis   . Pernicious anemia   . Pernicious anemia   . Rheumatic fever   . Thyroid disease   . Urinary and fecal incontinence     Past Surgical History:  Procedure Laterality Date  . ABDOMINAL HYSTERECTOMY     1981  . APPENDECTOMY  1981  . LEFT HEART CATH AND CORONARY ANGIOGRAPHY N/A 05/10/2020   Procedure: LEFT HEART CATH AND CORONARY ANGIOGRAPHY;  Surgeon: Nelva Bush, MD;  Location: [facility];  Service: Cardiovascular;  Laterality: N/A;  . prolapsed rectum 2015    . TONSILLECTOMY AND ADENOIDECTOMY  1966  . TUBAL LIGATION      There were  no vitals filed for this visit.   Subjective Assessment - 07/13/20 0857    Subjective I just got done with a 1 hour walk and I've been doing chair yoga.    Currently in Pain? No/denies                             [facility] Adult PT Treatment/Exercise - 07/13/20 0001      Knee/Hip Exercises: Aerobic   Nustep L4 x 10 min LE only for endurance and strengthening LE      Knee/Hip Exercises: Standing   Rocker Board 2 minutes    Rocker Board Limitations 1 min each leg due to leg length differences    Other Standing Knee Exercises weight shift in staggared stance both ways- green pod under Rt foot due to leg length discrepancy      Knee/Hip Exercises: Seated   Long Arc Quad Strengthening;20 reps    Clamshell with TheraBand Yellow   yellow loop x 20   Marching Strengthening;Both;20 reps   tap stool   Sit to Sand 10 reps;without UE support   chair and 1 pad  PT Short Term Goals - 07/13/20 0901      PT SHORT TERM GOAL #1   Title The patient will be able to walk 240 feet with RW needed for improved mobility at her new assisted living residence    Status Achieved      PT [place] #2   Title The patient will have improved LE strength with the ability to rise from a standard chair with min UE assist    Status On-going             PT Long Term Goals - 06/24/20 1527      PT LONG TERM GOAL #1   Title The patient will be independent with safe self progression of HEP and initiation of group ex program in community    Time 12    Period Weeks    Status New    Target Date 09/16/20      PT LONG TERM GOAL #2   Title The patient will be able to complete a 6 minute walk test and/or 480 feet with RW    Time 12    Period Weeks    Status New      PT LONG TERM GOAL #3   Title The patient will have LE strength to grossly 4/5 needed to rise from a standard chair 1x without UE assist    Time 12    Period Weeks    Status New      PT LONG TERM  GOAL #4   Title BERG balance score improved to 43/56 indicating decreased risk of falls    Time 12    Period Weeks    Status New      PT LONG TERM GOAL #5   Title Timed up and Go score improved to 22 sec or less    Time 12    Period Weeks    Status New      Additional Long Term Goals   Additional Long Term Goals Yes      PT LONG TERM GOAL #6   Title 5x sit to stand with light UE assist in 21 sec or less    Time 12    Period Weeks    Status New                 Plan - 07/13/20 9381    Clinical Impression Statement Pt has been walking more each day and reports that she walked an hour with her walker prior to coming to PT today.  Pt has also been participating in chair yoga classes.   PT adjusted pt's walker to improve posture/alignment with walking. Pt continues to work on sit to stand and requires elevated surface and was able to perform with min UE support and only 1 pad to sit on. Pt requires close stand by assistance for safety and verbal cues for technique throughout session.  Pt will benefit from skilled PT to continue to address strength and stability for reduced risk of falls and improved mobility    PT Frequency 2x / week    PT Duration 12 weeks    PT Treatment/Interventions ADLs/Self Care Home Management;Aquatic Therapy;Electrical Stimulation;Cryotherapy;Moist Heat;Therapeutic activities;Therapeutic exercise;Neuromuscular re-education;Manual techniques;Patient/family education;Taping    PT Next Visit Plan review sit to stand from chair with 2 cushions;  Nu-Step; combo low level seated and standing strengthening and balance ex's;  aquatic PT when available (wait list)    PT Home Exercise Plan [id number]    Consulted and  Agree with Plan of Care Patient           Patient will benefit from skilled therapeutic intervention in order to improve the following deficits and impairments:  Difficulty walking,Pain,Decreased balance,Decreased strength,Impaired perceived functional  ability  Visit Diagnosis: Unsteady gait  Pain in left hip  Muscle weakness (generalized)  Pain in right hip  Difficulty in walking, not elsewhere classified     Problem List Patient Active Problem List   Diagnosis Date Noted  . Palpitations 05/21/2020  . Coronary artery disease involving native coronary artery of native heart without angina pectoris 05/21/2020  . Near syncope 05/05/2020  . Seasonal affective disorder ([place]) 04/22/2020  . Bipolar 1 disorder, depressed ([place]) 03/01/2018  . HTN (hypertension) 03/01/2018  . Hypothyroid 03/01/2018  . Pernicious anemia 03/01/2018  . Osteoporosis 03/01/2018  . Macular degeneration 03/01/2018    Sigurd Sos, PT 07/13/20 9:36 AM   [facility] [address] [place], [place], [address] Phone: [phone]   Fax:  [phone]  Name: Karime Scheuermann MRN: [id number] Date of Birth: [birth year]

## 2020-07-14 DIAGNOSIS — F3131 Bipolar disorder, current episode depressed, mild: Secondary | ICD-10-CM | POA: Diagnosis not present

## 2020-07-15 ENCOUNTER — Other Ambulatory Visit: Payer: Self-pay

## 2020-07-15 ENCOUNTER — Ambulatory Visit: Payer: Medicare Other

## 2020-07-15 ENCOUNTER — Ambulatory Visit (INDEPENDENT_AMBULATORY_CARE_PROVIDER_SITE_OTHER): Payer: Medicare Other | Admitting: Psychology

## 2020-07-15 DIAGNOSIS — R262 Difficulty in walking, not elsewhere classified: Secondary | ICD-10-CM

## 2020-07-15 DIAGNOSIS — M25551 Pain in right hip: Secondary | ICD-10-CM | POA: Diagnosis not present

## 2020-07-15 DIAGNOSIS — M25552 Pain in left hip: Secondary | ICD-10-CM

## 2020-07-15 DIAGNOSIS — M6281 Muscle weakness (generalized): Secondary | ICD-10-CM | POA: Diagnosis not present

## 2020-07-15 DIAGNOSIS — F332 Major depressive disorder, recurrent severe without psychotic features: Secondary | ICD-10-CM

## 2020-07-15 DIAGNOSIS — R2681 Unsteadiness on feet: Secondary | ICD-10-CM

## 2020-07-15 NOTE — Therapy (Signed)
[facility] [address], [place], [place], [address] Phone: [phone]   Fax:  [phone]  Physical Therapy Treatment  Patient Details  Name: Jackie Jones MRN: [id number] Date of Birth: May 14, 1945 Referring Provider (PT): Windell Moulding NP   Encounter Date: 07/15/2020   PT End of Session - 07/15/20 0953    Visit Number 4    Date for PT Re-Evaluation 09/16/20    Authorization Type Medicare    Progress Note Due on Visit 10    PT Start Time 0912    PT Stop Time 0952    PT Time Calculation (min) 40 min    Activity Tolerance Patient tolerated treatment well    Behavior During Therapy [facility] for tasks assessed/performed           Past Medical History:  Diagnosis Date  . Allergy   . Arthritis   . Bipolar 1 disorder, depressed ([place])   . Bipolar disorder ([place])   . Chicken pox   . Dementia ([place])   . Depression   . Hashimoto's thyroiditis   . History of blood transfusion   . History of bone density study 2019  . History of colonoscopy 2015  . History of CT scan 2019  . History of mammogram 2019  . History of MRI   . History of Papanicolaou smear of cervix   . Hx: UTI (urinary tract infection)   . Hypertension   . Hypothyroidism   . Lactose intolerance   . Legally blind in left eye, as defined in [place]   . Non-celiac gluten sensitivity   . Osteoporosis   . Pernicious anemia   . Pernicious anemia   . Rheumatic fever   . Thyroid disease   . Urinary and fecal incontinence     Past Surgical History:  Procedure Laterality Date  . ABDOMINAL HYSTERECTOMY     1981  . APPENDECTOMY  1981  . LEFT HEART CATH AND CORONARY ANGIOGRAPHY N/A 05/10/2020   Procedure: LEFT HEART CATH AND CORONARY ANGIOGRAPHY;  Surgeon: Nelva Bush, MD;  Location: [facility];  Service: Cardiovascular;  Laterality: N/A;  . prolapsed rectum 2015    . TONSILLECTOMY AND ADENOIDECTOMY  1966  . TUBAL LIGATION      There were  no vitals filed for this visit.   Subjective Assessment - 07/15/20 0913    Subjective I walked yesterday more than I have walked this entire calendar year.  I walked 1.5 miles and for >2 hours.    Pertinent History goes by either "[place]" or "[name]";  heart arthymia; HTN; multi joint OA especially shoulders, knees and hips; scoliosis with signifcant leg length discrepancy; Bipolar 1    Currently in Pain? No/denies                             [facility] Adult PT Treatment/Exercise - 07/15/20 0001      Knee/Hip Exercises: Aerobic   Nustep L4 x 10 min LE only for endurance and strengthening LE      Knee/Hip Exercises: Standing   Heel Raises Both;10 reps;2 sets   UE support - shifting weight onto one side x 2 sets   Forward Step Up 1 set;5 reps;Hand Hold: 1;Step Height: 4"    Forward Step Up Limitations step taps on edge of treadmill 2x10 with min UE support    Other Standing Knee Exercises weight shift in staggared stance both ways- green pod  under Rt foot due to leg length discrepancy      Knee/Hip Exercises: Seated   Long Arc Quad Strengthening;20 reps    Long Arc Quad Limitations ball between knees    Clamshell with TheraBand Yellow   yellow loop x 20   Marching Strengthening;Both;20 reps   tap edge of treadmill   Sit to [employer] 10 reps;without UE support   chair and 1 pad                   PT Short Term Goals - 07/13/20 0901      PT SHORT TERM GOAL #1   Title The patient will be able to walk 240 feet with RW needed for improved mobility at her new assisted living residence    Status Achieved      PT [place] #2   Title The patient will have improved LE strength with the ability to rise from a standard chair with min UE assist    Status On-going             PT Long Term Goals - 06/24/20 1527      PT LONG TERM GOAL #1   Title The patient will be independent with safe self progression of HEP and initiation of group ex program in community    Time  12    Period Weeks    Status New    Target Date 09/16/20      PT LONG TERM GOAL #2   Title The patient will be able to complete a 6 minute walk test and/or 480 feet with RW    Time 12    Period Weeks    Status New      PT LONG TERM GOAL #3   Title The patient will have LE strength to grossly 4/5 needed to rise from a standard chair 1x without UE assist    Time 12    Period Weeks    Status New      PT LONG TERM GOAL #4   Title BERG balance score improved to 43/56 indicating decreased risk of falls    Time 12    Period Weeks    Status New      PT LONG TERM GOAL #5   Title Timed up and Go score improved to 22 sec or less    Time 12    Period Weeks    Status New      Additional Long Term Goals   Additional Long Term Goals Yes      PT LONG TERM GOAL #6   Title 5x sit to stand with light UE assist in 21 sec or less    Time 12    Period Weeks    Status New                 Plan - 07/15/20 0919    Clinical Impression Statement Pt has been walking more each day and reports that she walked 1.5 miles yesterday with her walker.  Pt has also been participating in chair yoga classes regularly.   Pt is doing well with the walker adjustment. Pt continues to work on sit to stand and requires elevated surface and was able to perform with min UE support and only 1 pad to sit on. Pt requires tactile and verbal cues to reduce bil hip adduction with sit to stand transition.  Pt requires close stand by assistance for safety and verbal cues for technique throughout session.  Pt will benefit from skilled PT to continue to address strength and stability for reduced risk of falls and improved mobility.    PT Frequency 2x / week    PT Duration 12 weeks    PT Treatment/Interventions ADLs/Self Care Home Management;Aquatic Therapy;Electrical Stimulation;Cryotherapy;Moist Heat;Therapeutic activities;Therapeutic exercise;Neuromuscular re-education;Manual techniques;Patient/family education;Taping     PT Next Visit Plan UE/LE strength, endurance, balance    PT Home Exercise Plan [id number]    Consulted and Agree with Plan of Care Patient           Patient will benefit from skilled therapeutic intervention in order to improve the following deficits and impairments:  Difficulty walking,Pain,Decreased balance,Decreased strength,Impaired perceived functional ability  Visit Diagnosis: Unsteady gait  Pain in left hip  Muscle weakness (generalized)  Pain in right hip  Difficulty in walking, not elsewhere classified     Problem List Patient Active Problem List   Diagnosis Date Noted  . Palpitations 05/21/2020  . Coronary artery disease involving native coronary artery of native heart without angina pectoris 05/21/2020  . Near syncope 05/05/2020  . Seasonal affective disorder ([place]) 04/22/2020  . Bipolar 1 disorder, depressed ([place]) 03/01/2018  . HTN (hypertension) 03/01/2018  . Hypothyroid 03/01/2018  . Pernicious anemia 03/01/2018  . Osteoporosis 03/01/2018  . Macular degeneration 03/01/2018   Sigurd Sos, PT 07/15/20 10:01 AM  [facility] [address] [place], [place], [address] Phone: [phone]   Fax:  [phone]  Name: Jackie Jones MRN: [id number] Date of Birth: 07-22-1945

## 2020-07-19 ENCOUNTER — Other Ambulatory Visit: Payer: Self-pay

## 2020-07-19 ENCOUNTER — Encounter: Payer: Self-pay | Admitting: Physical Therapy

## 2020-07-19 ENCOUNTER — Ambulatory Visit: Payer: Medicare Other | Attending: Orthopedic Surgery | Admitting: Physical Therapy

## 2020-07-19 DIAGNOSIS — M25552 Pain in left hip: Secondary | ICD-10-CM | POA: Diagnosis not present

## 2020-07-19 DIAGNOSIS — R262 Difficulty in walking, not elsewhere classified: Secondary | ICD-10-CM | POA: Diagnosis not present

## 2020-07-19 DIAGNOSIS — M6281 Muscle weakness (generalized): Secondary | ICD-10-CM | POA: Diagnosis not present

## 2020-07-19 DIAGNOSIS — M25551 Pain in right hip: Secondary | ICD-10-CM | POA: Diagnosis not present

## 2020-07-19 DIAGNOSIS — R2681 Unsteadiness on feet: Secondary | ICD-10-CM

## 2020-07-19 NOTE — Therapy (Signed)
[facility] [address] [place], [place], [address] Phone: [phone]   Fax:  [phone]  Physical Therapy Treatment  Patient Details  Name: Jackie Jones MRN: [id number] Date of Birth: Oct 22, 1945 Referring Provider (PT): Windell Moulding NP   Encounter Date: 07/19/2020   PT End of Session - 07/19/20 1014    Visit Number 5    Date for PT Re-Evaluation 09/16/20    Authorization Type Medicare    Progress Note Due on Visit 10    PT Start Time 1014    PT Stop Time 1052    PT Time Calculation (min) 38 min    Activity Tolerance Patient tolerated treatment well    Behavior During Therapy  [facility] for tasks assessed/performed           Past Medical History:  Diagnosis Date  . Allergy   . Arthritis   . Bipolar 1 disorder, depressed ([place])   . Bipolar disorder ([place])   . Chicken pox   . Dementia ([place])   . Depression   . Hashimoto's thyroiditis   . History of blood transfusion   . History of bone density study 2019  . History of colonoscopy 2015  . History of CT scan 2019  . History of mammogram 2019  . History of MRI   . History of Papanicolaou smear of cervix   . Hx: UTI (urinary tract infection)   . Hypertension   . Hypothyroidism   . Lactose intolerance   . Legally blind in left eye, as defined in [place]   . Non-celiac gluten sensitivity   . Osteoporosis   . Pernicious anemia   . Pernicious anemia   . Rheumatic fever   . Thyroid disease   . Urinary and fecal incontinence     Past Surgical History:  Procedure Laterality Date  . ABDOMINAL HYSTERECTOMY     1981  . APPENDECTOMY  1981  . LEFT HEART CATH AND CORONARY ANGIOGRAPHY N/A 05/10/2020   Procedure: LEFT HEART CATH AND CORONARY ANGIOGRAPHY;  Surgeon: Nelva Bush, MD;  Location: [place] [facility];  Service: Cardiovascular;  Laterality: N/A;  . prolapsed rectum 2015    . TONSILLECTOMY AND ADENOIDECTOMY  1966  . TUBAL LIGATION      There were  no vitals filed for this visit.   Subjective Assessment - 07/19/20 1018    Subjective My left shoulder is hurting quite a bit today otherwise I am doing ok.    Pertinent History goes by either "[place]" or "[name]";  heart arthymia; HTN; multi joint OA especially shoulders, knees and hips; scoliosis with signifcant leg length discrepancy; Bipolar 1    Currently in Pain? Yes    Pain Score 6     Pain Location Shoulder    Pain Orientation Left    Pain Descriptors / Indicators Sore    Aggravating Factors  shoulder hurts from the fall    Pain Relieving Factors meds    Multiple Pain Sites No                             [facility] Adult PT Treatment/Exercise - 07/19/20 0001      Knee/Hip Exercises: Aerobic   Nustep L4 x 10 min LE only for endurance and strengthening LE      Knee/Hip Exercises: Standing   Heel Raises Both;1 set;15 reps    Rocker Board 2 minutes    Other Standing Knee Exercises  weight shift in staggared stance both ways- green pod under Rt foot due to leg length discrepancy      Knee/Hip Exercises: Seated   Long Arc Quad Strengthening;20 reps    Long Arc Quad Limitations ball between knees    Clamshell with TheraBand Yellow   yellow loop x 20   Marching Strengthening;Both;20 reps   tap edge of treadmill   Sit to [employer] 10 reps;without UE support   chair and 1 pad                   PT Short Term Goals - 07/13/20 0901      PT SHORT TERM GOAL #1   Title The patient will be able to walk 240 feet with RW needed for improved mobility at her new assisted living residence    Status Achieved      PT [place] #2   Title The patient will have improved LE strength with the ability to rise from a standard chair with min UE assist    Status On-going             PT Long Term Goals - 06/24/20 1527      PT LONG TERM GOAL #1   Title The patient will be independent with safe self progression of HEP and initiation of group ex program in community     Time 12    Period Weeks    Status New    Target Date 09/16/20      PT LONG TERM GOAL #2   Title The patient will be able to complete a 6 minute walk test and/or 480 feet with RW    Time 12    Period Weeks    Status New      PT LONG TERM GOAL #3   Title The patient will have LE strength to grossly 4/5 needed to rise from a standard chair 1x without UE assist    Time 12    Period Weeks    Status New      PT LONG TERM GOAL #4   Title BERG balance score improved to 43/56 indicating decreased risk of falls    Time 12    Period Weeks    Status New      PT LONG TERM GOAL #5   Title Timed up and Go score improved to 22 sec or less    Time 12    Period Weeks    Status New      Additional Long Term Goals   Additional Long Term Goals Yes      PT LONG TERM GOAL #6   Title 5x sit to stand with light UE assist in 21 sec or less    Time 12    Period Weeks    Status New                 Plan - 07/19/20 1014    Clinical Impression Statement Pt arrives with Lt shoulder pain, this did not impact her balance/LE exercises. Using teal pod under Rt foot helps with leg length discrepancy during standing exercises. Pt has audible spine and knee grinding with most exercises but mostly sit to stand. pt reports this is nonpainful. Pt pretty fatigued witht his level of exercise. Pt did require close CGA for all standing exercises.    Personal Factors and Comorbidities Age;Comorbidity 1;Comorbidity 2;Comorbidity 3+;Transportation;Time since onset of injury/illness/exacerbation    Comorbidities scoliosis with leg length discrepancy; bil knee/hip  OA; cardiac history; HTN; reports incontinence issues related to cervical issues but states she can make adjustments in order to do pool therapy;    Examination-Activity Limitations Locomotion Level;Transfers;Lift;Hygiene/Grooming;Stand;Stairs;Sleep    Examination-Participation Restrictions Cleaning;Community Activity;Shop;Meal Prep    Stability/Clinical  Decision Making Evolving/Moderate complexity    Rehab Potential Good    PT Frequency 2x / week    PT Duration 12 weeks    PT Treatment/Interventions ADLs/Self Care Home Management;Aquatic Therapy;Electrical Stimulation;Cryotherapy;Moist Heat;Therapeutic activities;Therapeutic exercise;Neuromuscular re-education;Manual techniques;Patient/family education;Taping    PT Next Visit Plan UE/LE strength, endurance, balance    PT Home Exercise Plan [id number]    Consulted and Agree with Plan of Care Patient           Patient will benefit from skilled therapeutic intervention in order to improve the following deficits and impairments:  Difficulty walking,Pain,Decreased balance,Decreased strength,Impaired perceived functional ability  Visit Diagnosis: Unsteady gait  Pain in left hip  Muscle weakness (generalized)  Pain in right hip  Difficulty in walking, not elsewhere classified     Problem List Patient Active Problem List   Diagnosis Date Noted  . Palpitations 05/21/2020  . Coronary artery disease involving native coronary artery of native heart without angina pectoris 05/21/2020  . Near syncope 05/05/2020  . Seasonal affective disorder ([place]) 04/22/2020  . Bipolar 1 disorder, depressed ([place]) 03/01/2018  . HTN (hypertension) 03/01/2018  . Hypothyroid 03/01/2018  . Pernicious anemia 03/01/2018  . Osteoporosis 03/01/2018  . Macular degeneration 03/01/2018    Roseanne Juenger, PTA 07/19/2020, 10:57 AM  [facility] [address] [place], [place], [address] Phone: [phone]   Fax:  [phone]  Name: Jackie Jones MRN: [id number] Date of Birth: 1945-05-05

## 2020-07-22 ENCOUNTER — Ambulatory Visit: Payer: Medicare Other

## 2020-07-22 ENCOUNTER — Other Ambulatory Visit: Payer: Self-pay

## 2020-07-22 DIAGNOSIS — M25552 Pain in left hip: Secondary | ICD-10-CM | POA: Diagnosis not present

## 2020-07-22 DIAGNOSIS — M25551 Pain in right hip: Secondary | ICD-10-CM

## 2020-07-22 DIAGNOSIS — R262 Difficulty in walking, not elsewhere classified: Secondary | ICD-10-CM | POA: Diagnosis not present

## 2020-07-22 DIAGNOSIS — R2681 Unsteadiness on feet: Secondary | ICD-10-CM

## 2020-07-22 DIAGNOSIS — M6281 Muscle weakness (generalized): Secondary | ICD-10-CM

## 2020-07-22 NOTE — Therapy (Signed)
[facility] [address] [place], [place], [address] Phone: [phone]   Fax:  [phone]  Physical Therapy Treatment  Patient Details  Name: Jackie Jones MRN: [id number] Date of Birth: [birth year] Referring Provider (PT): Windell Moulding NP   Encounter Date: 07/22/2020   PT End of Session - 07/22/20 0923    Visit Number 6    Date for PT Re-Evaluation 09/16/20    Authorization Type Medicare    Progress Note Due on Visit 10    PT Start Time 0847    PT Stop Time 0928    PT Time Calculation (min) 41 min    Activity Tolerance Patient tolerated treatment well    Behavior During Therapy [facility] for tasks assessed/performed           Past Medical History:  Diagnosis Date  . Allergy   . Arthritis   . Bipolar 1 disorder, depressed ([place])   . Bipolar disorder ([place])   . Chicken pox   . Dementia ([place])   . Depression   . Hashimoto's thyroiditis   . History of blood transfusion   . History of bone density study 2019  . History of colonoscopy 2015  . History of CT scan 2019  . History of mammogram 2019  . History of MRI   . History of Papanicolaou smear of cervix   . Hx: UTI (urinary tract infection)   . Hypertension   . Hypothyroidism   . Lactose intolerance   . Legally blind in left eye, as defined in [place]   . Non-celiac gluten sensitivity   . Osteoporosis   . Pernicious anemia   . Pernicious anemia   . Rheumatic fever   . Thyroid disease   . Urinary and fecal incontinence     Past Surgical History:  Procedure Laterality Date  . ABDOMINAL HYSTERECTOMY     1981  . APPENDECTOMY  1981  . LEFT HEART CATH AND CORONARY ANGIOGRAPHY N/A 05/10/2020   Procedure: LEFT HEART CATH AND CORONARY ANGIOGRAPHY;  Surgeon: Nelva Bush, MD;  Location: [facility];  Service: Cardiovascular;  Laterality: N/A;  . prolapsed rectum 2015    . TONSILLECTOMY AND ADENOIDECTOMY  1966  . TUBAL LIGATION      There were  no vitals filed for this visit.   Subjective Assessment - 07/22/20 0825    Subjective I walked yesterday.  Using my Rollator because I am waiting on my skiis for the bottom of my other walker.    Patient Stated Goals I'd like to walk again (eventually walked 4 miles); get strength in legs for steps (my sister had good PT and taught me to do 1 inch step ups on book.)  Move around safely                             [facility] Adult PT Treatment/Exercise - 07/22/20 0001      Knee/Hip Exercises: Aerobic   Nustep L4 x 10 min LE only for endurance and strengthening LE      Knee/Hip Exercises: Standing   Heel Raises Both;1 set;15 reps    Other Standing Knee Exercises weight shift in staggared stance both ways- green pod under Rt foot due to leg length discrepancy      Knee/Hip Exercises: Seated   Clamshell with TheraBand Yellow   yellow loop x 20   Marching Strengthening;Both;20 reps   tap edge of treadmill  Marching Weights 2 lbs.    Sit to Sand 10 reps;without UE support   chair and 1 pad                   PT Short Term Goals - 07/13/20 0901      PT SHORT TERM GOAL #1   Title The patient will be able to walk 240 feet with RW needed for improved mobility at her new assisted living residence    Status Achieved      PT [place] #2   Title The patient will have improved LE strength with the ability to rise from a standard chair with min UE assist    Status On-going             PT Long Term Goals - 06/24/20 1527      PT LONG TERM GOAL #1   Title The patient will be independent with safe self progression of HEP and initiation of group ex program in community    Time 12    Period Weeks    Status New    Target Date 09/16/20      PT LONG TERM GOAL #2   Title The patient will be able to complete a 6 minute walk test and/or 480 feet with RW    Time 12    Period Weeks    Status New      PT LONG TERM GOAL #3   Title The patient will have LE  strength to grossly 4/5 needed to rise from a standard chair 1x without UE assist    Time 12    Period Weeks    Status New      PT LONG TERM GOAL #4   Title BERG balance score improved to 43/56 indicating decreased risk of falls    Time 12    Period Weeks    Status New      PT LONG TERM GOAL #5   Title Timed up and Go score improved to 22 sec or less    Time 12    Period Weeks    Status New      Additional Long Term Goals   Additional Long Term Goals Yes      PT LONG TERM GOAL #6   Title 5x sit to stand with light UE assist in 21 sec or less    Time 12    Period Weeks    Status New                 Plan - 07/22/20 0920    Clinical Impression Statement Pt is now taking shorter walks and staying closer to home 2x/day. Pt is challenged with sit to stand transition although is able to do with min UE support and requires UE support with stand to sit to control descent. Pt used green pod under Rt foot with standing exercises to accommodate for leg length discrepancy.  Pt tolerated all exercise well today and PT provided stand by supervision for safety and verbal cues for technique.    PT Treatment/Interventions ADLs/Self Care Home Management;Aquatic Therapy;Electrical Stimulation;Cryotherapy;Moist Heat;Therapeutic activities;Therapeutic exercise;Neuromuscular re-education;Manual techniques;Patient/family education;Taping    PT Next Visit Plan UE/LE strength, endurance, balance    PT Home Exercise Plan [id number]    Consulted and Agree with Plan of Care Patient           Patient will benefit from skilled therapeutic intervention in order to improve the following deficits and impairments:  Difficulty  walking,Pain,Decreased balance,Decreased strength,Impaired perceived functional ability  Visit Diagnosis: Unsteady gait  Pain in left hip  Muscle weakness (generalized)  Pain in right hip  Difficulty in walking, not elsewhere classified     Problem List Patient Active  Problem List   Diagnosis Date Noted  . Palpitations 05/21/2020  . Coronary artery disease involving native coronary artery of native heart without angina pectoris 05/21/2020  . Near syncope 05/05/2020  . Seasonal affective disorder ([place]) 04/22/2020  . Bipolar 1 disorder, depressed ([place]) 03/01/2018  . HTN (hypertension) 03/01/2018  . Hypothyroid 03/01/2018  . Pernicious anemia 03/01/2018  . Osteoporosis 03/01/2018  . Macular degeneration 03/01/2018     Sigurd Sos, PT 07/22/20 9:32 AM  [facility] [address] [place], [place], [address] Phone: [phone]   Fax:  [phone]  Name: Nakyla Bracco MRN: [id number] Date of Birth: 01/18/1946

## 2020-07-26 ENCOUNTER — Other Ambulatory Visit: Payer: Self-pay

## 2020-07-26 ENCOUNTER — Ambulatory Visit: Payer: Medicare Other

## 2020-07-26 DIAGNOSIS — M25551 Pain in right hip: Secondary | ICD-10-CM | POA: Diagnosis not present

## 2020-07-26 DIAGNOSIS — R2681 Unsteadiness on feet: Secondary | ICD-10-CM | POA: Diagnosis not present

## 2020-07-26 DIAGNOSIS — M6281 Muscle weakness (generalized): Secondary | ICD-10-CM | POA: Diagnosis not present

## 2020-07-26 DIAGNOSIS — R262 Difficulty in walking, not elsewhere classified: Secondary | ICD-10-CM | POA: Diagnosis not present

## 2020-07-26 DIAGNOSIS — M25552 Pain in left hip: Secondary | ICD-10-CM

## 2020-07-26 NOTE — Therapy (Signed)
[facility] [address] [place], [place], [address] Phone: [phone]   Fax:  [phone]  Physical Therapy Treatment  Patient Details  Name: Jackie Jones MRN: [id number] Date of Birth: 1946/02/10 Referring Provider (PT): Windell Moulding NP   Encounter Date: 07/26/2020   PT End of Session - 07/26/20 1101    Visit Number 7    Date for PT Re-Evaluation 09/16/20    Authorization Type Medicare    Progress Note Due on Visit 10    PT Start Time 7782    PT Stop Time 1054    PT Time Calculation (min) 39 min    Activity Tolerance Patient tolerated treatment well    Behavior During Therapy [facility] for tasks assessed/performed           Past Medical History:  Diagnosis Date  . Allergy   . Arthritis   . Bipolar 1 disorder, depressed ([place])   . Bipolar disorder ([place])   . Chicken pox   . Dementia ([place])   . Depression   . Hashimoto's thyroiditis   . History of blood transfusion   . History of bone density study 2019  . History of colonoscopy 2015  . History of CT scan 2019  . History of mammogram 2019  . History of MRI   . History of Papanicolaou smear of cervix   . Hx: UTI (urinary tract infection)   . Hypertension   . Hypothyroidism   . Lactose intolerance   . Legally blind in left eye, as defined in [place]   . Non-celiac gluten sensitivity   . Osteoporosis   . Pernicious anemia   . Pernicious anemia   . Rheumatic fever   . Thyroid disease   . Urinary and fecal incontinence     Past Surgical History:  Procedure Laterality Date  . ABDOMINAL HYSTERECTOMY     1981  . APPENDECTOMY  1981  . LEFT HEART CATH AND CORONARY ANGIOGRAPHY N/A 05/10/2020   Procedure: LEFT HEART CATH AND CORONARY ANGIOGRAPHY;  Surgeon: Nelva Bush, MD;  Location: [facility];  Service: Cardiovascular;  Laterality: N/A;  . prolapsed rectum 2015    . TONSILLECTOMY AND ADENOIDECTOMY  1966  . TUBAL LIGATION      There were  no vitals filed for this visit.   Subjective Assessment - 07/26/20 1019    Subjective I'm still working on sit to stand.  I am still working on doing shorter walks.    Currently in Pain? No/denies                             [facility] Adult PT Treatment/Exercise - 07/26/20 0001      Knee/Hip Exercises: Aerobic   Nustep L4 x 10 min LE only for endurance and strengthening LE      Knee/Hip Exercises: Standing   Heel Raises Both;1 set;15 reps    Hip Abduction Right;Left;2 sets;10 reps;Knee straight    Abduction Limitations pod under left foot    Other Standing Knee Exercises weight shift in staggared stance both ways- green pod under Rt foot due to leg length discrepancy      Knee/Hip Exercises: Seated   Long Arc Quad Strengthening;20 reps    Long Arc Quad Weight 2 lbs.    Long Arc Quad Limitations ball between knees    Clamshell with TheraBand Yellow   yellow loop x 20   Marching Strengthening;Both;20 reps;2 sets  tap edge of treadmill   Marching Weights 2 lbs.    Sit to Sand 10 reps;without UE support   chair and 1 pad                   PT Short Term Goals - 07/13/20 0901      PT SHORT TERM GOAL #1   Title The patient will be able to walk 240 feet with RW needed for improved mobility at her new assisted living residence    Status Achieved      PT [place] #2   Title The patient will have improved LE strength with the ability to rise from a standard chair with min UE assist    Status On-going             PT Long Term Goals - 06/24/20 1527      PT LONG TERM GOAL #1   Title The patient will be independent with safe self progression of HEP and initiation of group ex program in community    Time 12    Period Weeks    Status New    Target Date 09/16/20      PT LONG TERM GOAL #2   Title The patient will be able to complete a 6 minute walk test and/or 480 feet with RW    Time 12    Period Weeks    Status New      PT LONG TERM GOAL #3    Title The patient will have LE strength to grossly 4/5 needed to rise from a standard chair 1x without UE assist    Time 12    Period Weeks    Status New      PT LONG TERM GOAL #4   Title BERG balance score improved to 43/56 indicating decreased risk of falls    Time 12    Period Weeks    Status New      PT LONG TERM GOAL #5   Title Timed up and Go score improved to 22 sec or less    Time 12    Period Weeks    Status New      Additional Long Term Goals   Additional Long Term Goals Yes      PT LONG TERM GOAL #6   Title 5x sit to stand with light UE assist in 21 sec or less    Time 12    Period Weeks    Status New                 Plan - 07/26/20 1027    Clinical Impression Statement Pt is now taking shorter walks and staying closer to home 2x/day. Pt walked indoors over the weekend due to weather. Pt is challenged with sit to stand transition although is able to do with min UE support and requires UE support with stand to sit to control descent. Pt remains challenged by current level of exercise in the clinic. Pt did have 1 episode of "overexertion" during session that pt says is a normal occurance for her. Pt checked radial pulse for symptoms of a-fib as pt reports this is common with her.  Vitals normal during period of assessment.   PT monitored for 5 minutes after this episoded and PT stopped exercise.  PT provided stand by supervision for safety and verbal cues for technique.    PT Frequency 2x / week    PT Duration 12 weeks  PT Treatment/Interventions ADLs/Self Care Home Management;Aquatic Therapy;Electrical Stimulation;Cryotherapy;Moist Heat;Therapeutic activities;Therapeutic exercise;Neuromuscular re-education;Manual techniques;Patient/family education;Taping    PT Next Visit Plan UE/LE strength, endurance, balance    PT Home Exercise Plan [id number]    Consulted and Agree with Plan of Care Patient           Patient will benefit from skilled therapeutic  intervention in order to improve the following deficits and impairments:  Difficulty walking,Pain,Decreased balance,Decreased strength,Impaired perceived functional ability  Visit Diagnosis: Unsteady gait  Pain in left hip  Muscle weakness (generalized)  Pain in right hip  Difficulty in walking, not elsewhere classified     Problem List Patient Active Problem List   Diagnosis Date Noted  . Palpitations 05/21/2020  . Coronary artery disease involving native coronary artery of native heart without angina pectoris 05/21/2020  . Near syncope 05/05/2020  . Seasonal affective disorder ([place]) 04/22/2020  . Bipolar 1 disorder, depressed ([place]) 03/01/2018  . HTN (hypertension) 03/01/2018  . Hypothyroid 03/01/2018  . Pernicious anemia 03/01/2018  . Osteoporosis 03/01/2018  . Macular degeneration 03/01/2018     Sigurd Sos, PT 07/26/20 11:03 AM  [facility] [address] [place], [place], [address] Phone: [phone]   Fax:  [phone]  Name: Jackie Jones MRN: [id number] Date of Birth: 1945/08/03

## 2020-08-02 ENCOUNTER — Other Ambulatory Visit: Payer: Self-pay

## 2020-08-02 ENCOUNTER — Ambulatory Visit: Payer: Medicare Other

## 2020-08-02 DIAGNOSIS — R2681 Unsteadiness on feet: Secondary | ICD-10-CM | POA: Diagnosis not present

## 2020-08-02 DIAGNOSIS — M25551 Pain in right hip: Secondary | ICD-10-CM

## 2020-08-02 DIAGNOSIS — R262 Difficulty in walking, not elsewhere classified: Secondary | ICD-10-CM | POA: Diagnosis not present

## 2020-08-02 DIAGNOSIS — M25552 Pain in left hip: Secondary | ICD-10-CM

## 2020-08-02 DIAGNOSIS — M6281 Muscle weakness (generalized): Secondary | ICD-10-CM

## 2020-08-02 NOTE — Therapy (Signed)
[facility] [address] [place], [place], [address] Phone: [phone]   Fax:  [phone]  Physical Therapy Treatment  Patient Details  Name: Jackie Jones MRN: [id number] Date of Birth: [birth year] Referring Provider (PT): Windell Moulding NP   Encounter Date: 08/02/2020   PT End of Session - 08/02/20 0931    Visit Number 8    Date for PT Re-Evaluation 09/16/20    Authorization Type Medicare    Progress Note Due on Visit 10    PT Start Time 0845    PT Stop Time 0923    PT Time Calculation (min) 38 min    Activity Tolerance Patient tolerated treatment well    Behavior During Therapy [facility] for tasks assessed/performed           Past Medical History:  Diagnosis Date  . Allergy   . Arthritis   . Bipolar 1 disorder, depressed ([place])   . Bipolar disorder ([place])   . Chicken pox   . Dementia ([place])   . Depression   . Hashimoto's thyroiditis   . History of blood transfusion   . History of bone density study 2019  . History of colonoscopy 2015  . History of CT scan 2019  . History of mammogram 2019  . History of MRI   . History of Papanicolaou smear of cervix   . Hx: UTI (urinary tract infection)   . Hypertension   . Hypothyroidism   . Lactose intolerance   . Legally blind in left eye, as defined in [place]   . Non-celiac gluten sensitivity   . Osteoporosis   . Pernicious anemia   . Pernicious anemia   . Rheumatic fever   . Thyroid disease   . Urinary and fecal incontinence     Past Surgical History:  Procedure Laterality Date  . ABDOMINAL HYSTERECTOMY     1981  . APPENDECTOMY  1981  . LEFT HEART CATH AND CORONARY ANGIOGRAPHY N/A 05/10/2020   Procedure: LEFT HEART CATH AND CORONARY ANGIOGRAPHY;  Surgeon: Nelva Bush, MD;  Location: [facility];  Service: Cardiovascular;  Laterality: N/A;  . prolapsed rectum 2015    . TONSILLECTOMY AND ADENOIDECTOMY  1966  . TUBAL LIGATION      There were  no vitals filed for this visit.   Subjective Assessment - 08/02/20 0851    Subjective I had to cancel last session because I am having problems with my a-fib.  MD said to modify exercise as needed.  I've been attending classes and walking.    Pertinent History goes by either "Jackie Jones" or "Jackie Jones";  heart arthymia; HTN; multi joint OA especially shoulders, knees and hips; scoliosis with signifcant leg length discrepancy; Bipolar 1    Currently in Pain? No/denies              [facility] PT Assessment - 08/02/20 0001      Timed Up and Go Test   Normal TUG (seconds) 16.11                         [facility] Adult PT Treatment/Exercise - 08/02/20 0001      Knee/Hip Exercises: Aerobic   Nustep L4 x 10 min LE only for endurance and strengthening LE      Knee/Hip Exercises: Standing   Heel Raises Both;1 set;15 reps    Other Standing Knee Exercises weight shift in staggared stance both ways- green pod under Rt foot due  to leg length discrepancy      Knee/Hip Exercises: Seated   Long Arc Quad Strengthening;20 reps    Long Arc Quad Limitations ball between [employer] Squeeze 5" hold x 20    Clamshell with TheraBand Yellow   yellow loop x 20   Marching Strengthening;Both;20 reps;2 sets   tap edge of treadmill   Sit to [employer] 10 reps;without UE support   chair and 1 pad                   PT Short Term Goals - 08/02/20 0853      PT SHORT TERM GOAL #3   Title TUG test improved to 25 sec indicating improved gait speed    Baseline 16.11 seconds    Status Achieved             PT Long Term Goals - 06/24/20 1527      PT LONG TERM GOAL #1   Title The patient will be independent with safe self progression of HEP and initiation of group ex program in community    Time 12    Period Weeks    Status New    Target Date 09/16/20      PT LONG TERM GOAL #2   Title The patient will be able to complete a 6 minute walk test and/or 480 feet with RW    Time 12    Period Weeks     Status New      PT LONG TERM GOAL #3   Title The patient will have LE strength to grossly 4/5 needed to rise from a standard chair 1x without UE assist    Time 12    Period Weeks    Status New      PT LONG TERM GOAL #4   Title BERG balance score improved to 43/56 indicating decreased risk of falls    Time 12    Period Weeks    Status New      PT LONG TERM GOAL #5   Title Timed up and Go score improved to 22 sec or less    Time 12    Period Weeks    Status New      Additional Long Term Goals   Additional Long Term Goals Yes      PT LONG TERM GOAL #6   Title 5x sit to stand with light UE assist in 21 sec or less    Time 12    Period Weeks    Status New                 Plan - 08/02/20 5188    Clinical Impression Statement Pt missed appointment last week due to not feeling well and a-fib.  Pt spoke to her cardiologist and he said to monitor her heart rhythm with exercise.  She has been attending classes for strength and flexibility at the facility where she lives.  Pt was monitored closely throughout session by PT for fatigue, safety and symptoms of a-fib.  TUG time is improved to 16.11 seconds today, improved from 29 seconds at evaluaton.  Pt continues to be challenged by current level of exercise.  Pt with slow progress due to complexity of medical history and chronicity of condition.    PT Frequency 2x / week    PT Duration 12 weeks    PT Treatment/Interventions ADLs/Self Care Home Management;Aquatic Therapy;Electrical Stimulation;Cryotherapy;Moist Heat;Therapeutic activities;Therapeutic exercise;Neuromuscular re-education;Manual techniques;Patient/family education;Taping  PT Next Visit Plan UE/LE strength, endurance, balance    PT Home Exercise Plan [id number]    Consulted and Agree with Plan of Care Patient           Patient will benefit from skilled therapeutic intervention in order to improve the following deficits and impairments:  Difficulty  walking,Pain,Decreased balance,Decreased strength,Impaired perceived functional ability  Visit Diagnosis: Unsteady gait  Pain in left hip  Muscle weakness (generalized)  Pain in right hip  Difficulty in walking, not elsewhere classified     Problem List Patient Active Problem List   Diagnosis Date Noted  . Palpitations 05/21/2020  . Coronary artery disease involving native coronary artery of native heart without angina pectoris 05/21/2020  . Near syncope 05/05/2020  . Seasonal affective disorder ([place]) 04/22/2020  . Bipolar 1 disorder, depressed ([place]) 03/01/2018  . HTN (hypertension) 03/01/2018  . Hypothyroid 03/01/2018  . Pernicious anemia 03/01/2018  . Osteoporosis 03/01/2018  . Macular degeneration 03/01/2018     Sigurd Sos, PT 08/02/20 9:33 AM  [facility] [address], [place], [place], [address] Phone: [phone]   Fax:  [phone]  Name: Jackie Jones MRN: [id number] Date of Birth: Feb 01, 1946

## 2020-08-04 ENCOUNTER — Ambulatory Visit (INDEPENDENT_AMBULATORY_CARE_PROVIDER_SITE_OTHER): Payer: Medicare Other | Admitting: Psychology

## 2020-08-04 ENCOUNTER — Ambulatory Visit: Payer: Medicare Other | Admitting: Physical Therapy

## 2020-08-04 ENCOUNTER — Other Ambulatory Visit: Payer: Self-pay

## 2020-08-04 ENCOUNTER — Encounter: Payer: Self-pay | Admitting: Physical Therapy

## 2020-08-04 DIAGNOSIS — R2681 Unsteadiness on feet: Secondary | ICD-10-CM

## 2020-08-04 DIAGNOSIS — M25551 Pain in right hip: Secondary | ICD-10-CM | POA: Diagnosis not present

## 2020-08-04 DIAGNOSIS — R262 Difficulty in walking, not elsewhere classified: Secondary | ICD-10-CM

## 2020-08-04 DIAGNOSIS — M25552 Pain in left hip: Secondary | ICD-10-CM

## 2020-08-04 DIAGNOSIS — M6281 Muscle weakness (generalized): Secondary | ICD-10-CM | POA: Diagnosis not present

## 2020-08-04 DIAGNOSIS — F332 Major depressive disorder, recurrent severe without psychotic features: Secondary | ICD-10-CM | POA: Diagnosis not present

## 2020-08-04 NOTE — Therapy (Signed)
[facility] [address] [place], [place], [address] Phone: [phone]   Fax:  [phone]  Physical Therapy Treatment  Patient Details  Name: Jackie Jones MRN: [id number] Date of Birth: 03/02/1946 Referring Provider (PT): Windell Moulding NP   Encounter Date: 08/04/2020   PT End of Session - 08/04/20 0843    Visit Number 9    Date for PT Re-Evaluation 09/16/20    Authorization Type Medicare    Progress Note Due on Visit 10    PT Start Time 0843    PT Stop Time 0927    PT Time Calculation (min) 44 min    Activity Tolerance Patient tolerated treatment well    Behavior During Therapy [facility] for tasks assessed/performed           Past Medical History:  Diagnosis Date  . Allergy   . Arthritis   . Bipolar 1 disorder, depressed ([place])   . Bipolar disorder ([place])   . Chicken pox   . Dementia ([place])   . Depression   . Hashimoto's thyroiditis   . History of blood transfusion   . History of bone density study 2019  . History of colonoscopy 2015  . History of CT scan 2019  . History of mammogram 2019  . History of MRI   . History of Papanicolaou smear of cervix   . Hx: UTI (urinary tract infection)   . Hypertension   . Hypothyroidism   . Lactose intolerance   . Legally blind in left eye, as defined in [place]   . Non-celiac gluten sensitivity   . Osteoporosis   . Pernicious anemia   . Pernicious anemia   . Rheumatic fever   . Thyroid disease   . Urinary and fecal incontinence     Past Surgical History:  Procedure Laterality Date  . ABDOMINAL HYSTERECTOMY     1981  . APPENDECTOMY  1981  . LEFT HEART CATH AND CORONARY ANGIOGRAPHY N/A 05/10/2020   Procedure: LEFT HEART CATH AND CORONARY ANGIOGRAPHY;  Surgeon: Nelva Bush, MD;  Location: [facility];  Service: Cardiovascular;  Laterality: N/A;  . prolapsed rectum 2015    . TONSILLECTOMY AND ADENOIDECTOMY  1966  . TUBAL LIGATION      There were  no vitals filed for this visit.   Subjective Assessment - 08/04/20 0844    Subjective No new complaints this AM.    Pertinent History goes by either "Jackie Jones" or "Jackie Jones";  heart arthymia; HTN; multi joint OA especially shoulders, knees and hips; scoliosis with signifcant leg length discrepancy; Bipolar 1    Limitations Walking;House hold activities;Standing    How long can you walk comfortably? 1/10 mile to dining room 1-3x/day I can do that without resting    Patient Stated Goals I'd like to walk again (eventually walked 4 miles); get strength in legs for steps (my sister had good PT and taught me to do 1 inch step ups on book.)  Move around safely    Currently in Pain? No/denies    Multiple Pain Sites No              [facility] PT Assessment - 08/04/20 0001      6 Minute Walk- Baseline   6 Minute Walk- Baseline yes      6 minute walk test results    Aerobic Endurance Distance Walked --   380   Endurance additional comments 3 min walk test: RW and required to sit secondary  to SOB/breathing                         [facility] Adult PT Treatment/Exercise - 08/04/20 0001      Knee/Hip Exercises: Aerobic   Nustep L4 x 10 min LE only for endurance and strengthening LE   Discuused and answered pt questions about her upcoming aquatics session     Knee/Hip Exercises: Standing   Heel Raises Both;1 set;20 reps   RT foot on teal pod; requires UE for balance   Other Standing Knee Exercises weight shift in staggared stance both ways- green pod under Rt foot due to leg length discrepancy   1 min in each direction; light UE required     Knee/Hip Exercises: Seated   Long Arc Quad Strengthening;Both;1 set;10 reps;Weights    Long Arc Quad Weight 2 lbs.    Ball Squeeze 5" hold x 20    Clamshell with TheraBand Yellow   yellow loop x 20   Marching Strengthening;Both;20 reps;2 sets   tap edge of treadmill                   PT Short Term Goals - 08/02/20 0853      PT SHORT TERM  GOAL #3   Title TUG test improved to 25 sec indicating improved gait speed    Baseline 16.11 seconds    Status Achieved             PT Long Term Goals - 06/24/20 1527      PT LONG TERM GOAL #1   Title The patient will be independent with safe self progression of HEP and initiation of group ex program in community    Time 12    Period Weeks    Status New    Target Date 09/16/20      PT LONG TERM GOAL #2   Title The patient will be able to complete a 6 minute walk test and/or 480 feet with RW    Time 12    Period Weeks    Status New      PT LONG TERM GOAL #3   Title The patient will have LE strength to grossly 4/5 needed to rise from a standard chair 1x without UE assist    Time 12    Period Weeks    Status New      PT LONG TERM GOAL #4   Title BERG balance score improved to 43/56 indicating decreased risk of falls    Time 12    Period Weeks    Status New      PT LONG TERM GOAL #5   Title Timed up and Go score improved to 22 sec or less    Time 12    Period Weeks    Status New      Additional Long Term Goals   Additional Long Term Goals Yes      PT LONG TERM GOAL #6   Title 5x sit to stand with light UE assist in 21 sec or less    Time 12    Period Weeks    Status New                 Plan - 08/04/20 6433    Clinical Impression Statement Pt arrives with no complaints of pain today. She requested we be "mindful" during todays session to keep from activating A-Fib symptoms.    Personal Factors and Comorbidities Age;Comorbidity 1;Comorbidity  2;Comorbidity 3+;Transportation;Time since onset of injury/illness/exacerbation    Comorbidities scoliosis with leg length discrepancy; bil knee/hip OA; cardiac history; HTN; reports incontinence issues related to cervical issues but states she can make adjustments in order to do pool therapy;    Examination-Activity Limitations Locomotion Level;Transfers;Lift;Hygiene/Grooming;Stand;Stairs;Sleep     Examination-Participation Restrictions Cleaning;Community Activity;Shop;Meal Prep    Stability/Clinical Decision Making Evolving/Moderate complexity    Rehab Potential Good    PT Frequency 2x / week    PT Duration 12 weeks    PT Treatment/Interventions ADLs/Self Care Home Management;Aquatic Therapy;Electrical Stimulation;Cryotherapy;Moist Heat;Therapeutic activities;Therapeutic exercise;Neuromuscular re-education;Manual techniques;Patient/family education;Taping    PT Home Exercise Plan [id number]    Consulted and Agree with Plan of Care Patient           Patient will benefit from skilled therapeutic intervention in order to improve the following deficits and impairments:  Difficulty walking,Pain,Decreased balance,Decreased strength,Impaired perceived functional ability  Visit Diagnosis: Unsteady gait  Pain in left hip  Muscle weakness (generalized)  Pain in right hip  Difficulty in walking, not elsewhere classified     Problem List Patient Active Problem List   Diagnosis Date Noted  . Palpitations 05/21/2020  . Coronary artery disease involving native coronary artery of native heart without angina pectoris 05/21/2020  . Near syncope 05/05/2020  . Seasonal affective disorder ([place]) 04/22/2020  . Bipolar 1 disorder, depressed ([place]) 03/01/2018  . HTN (hypertension) 03/01/2018  . Hypothyroid 03/01/2018  . Pernicious anemia 03/01/2018  . Osteoporosis 03/01/2018  . Macular degeneration 03/01/2018    Dois Juarbe, PTA 08/04/2020, 9:28 AM  [facility] [address] [place], [place], [address] Phone: [phone]   Fax:  [phone]  Name: Trace Cederberg MRN: [id number] Date of Birth: 1945-12-23

## 2020-08-09 ENCOUNTER — Ambulatory Visit: Payer: Medicare Other | Admitting: Physical Therapy

## 2020-08-09 ENCOUNTER — Other Ambulatory Visit: Payer: Self-pay

## 2020-08-09 ENCOUNTER — Encounter: Payer: Self-pay | Admitting: Physical Therapy

## 2020-08-09 DIAGNOSIS — M6281 Muscle weakness (generalized): Secondary | ICD-10-CM | POA: Diagnosis not present

## 2020-08-09 DIAGNOSIS — M25551 Pain in right hip: Secondary | ICD-10-CM | POA: Diagnosis not present

## 2020-08-09 DIAGNOSIS — M25552 Pain in left hip: Secondary | ICD-10-CM | POA: Diagnosis not present

## 2020-08-09 DIAGNOSIS — R2681 Unsteadiness on feet: Secondary | ICD-10-CM | POA: Diagnosis not present

## 2020-08-09 DIAGNOSIS — R262 Difficulty in walking, not elsewhere classified: Secondary | ICD-10-CM

## 2020-08-09 NOTE — Therapy (Addendum)
[facility] [address], [place], [place], [address] Phone: [phone]   Fax:  [phone]  Physical Therapy Treatment  Patient Details  Name: Jackie Jones MRN: [id number] Date of Birth: 09-29-1945 Referring Provider (PT): Windell Moulding NP   Encounter Date: 08/09/2020 Progress Note Reporting Period 06/24/20  to 08/09/20  See note below for Objective Data and Assessment of Progress/Goals.   Sigurd Sos, PT 08/09/20 10:16 AM     PT End of Session - 08/09/20 0832    Visit Number 10    Date for PT Re-Evaluation 09/16/20    Authorization Type Medicare    Progress Note Due on Visit 10    PT Start Time 0826    PT Stop Time 0915    PT Time Calculation (min) 49 min    Activity Tolerance Patient tolerated treatment well    Behavior During Therapy [facility] for tasks assessed/performed           Past Medical History:  Diagnosis Date  . Allergy   . Arthritis   . Bipolar 1 disorder, depressed ([place])   . Bipolar disorder ([place])   . Chicken pox   . Dementia ([place])   . Depression   . Hashimoto's thyroiditis   . History of blood transfusion   . History of bone density study 2019  . History of colonoscopy 2015  . History of CT scan 2019  . History of mammogram 2019  . History of MRI   . History of Papanicolaou smear of cervix   . Hx: UTI (urinary tract infection)   . Hypertension   . Hypothyroidism   . Lactose intolerance   . Legally blind in left eye, as defined in [place]   . Non-celiac gluten sensitivity   . Osteoporosis   . Pernicious anemia   . Pernicious anemia   . Rheumatic fever   . Thyroid disease   . Urinary and fecal incontinence     Past Surgical History:  Procedure Laterality Date  . ABDOMINAL HYSTERECTOMY     1981  . APPENDECTOMY  1981  . LEFT HEART CATH AND CORONARY ANGIOGRAPHY N/A 05/10/2020   Procedure: LEFT HEART CATH AND CORONARY ANGIOGRAPHY;  Surgeon: Nelva Bush, MD;  Location: [place] [facility];  Service: Cardiovascular;  Laterality: N/A;  . prolapsed rectum 2015    . TONSILLECTOMY AND ADENOIDECTOMY  1966  . TUBAL LIGATION      There were no vitals filed for this visit.   Subjective Assessment - 08/09/20 0831    Subjective I found out i have a Nustep machine where I live. I have been using it regularly and my goal is to get to 10,000 steps by th eend of this week.    Pertinent History goes by either "Gala" or "[name]";  heart arthymia; HTN; multi joint OA especially shoulders, knees and hips; scoliosis with signifcant leg length discrepancy; Bipolar 1    Currently in Pain? No/denies    Multiple Pain Sites No              [facility] PT Assessment - 08/09/20 0001      6 minute walk test results    Endurance additional comments 5 min 582 feet with RW      Berg Balance Test   Sit to Stand Able to stand without using hands and stabilize independently    Standing Unsupported Able to stand safely 2 minutes    Sitting with Back Unsupported but Feet Supported  on Floor or Stool Able to sit safely and securely 2 minutes    Stand to Sit Sits safely with minimal use of hands    Transfers Able to transfer safely, minor use of hands    Standing Unsupported with Eyes Closed Able to stand 10 seconds with supervision    Standing Unsupported with Feet Together Able to place feet together independently and stand for 1 minute with supervision    From Standing, Reach Forward with Outstretched Arm Can reach forward >5 cm safely (2")    From Standing Position, Pick up Object from [place] to pick up shoe safely and easily    From Standing Position, Turn to Look Behind Over each Shoulder Looks behind one side only/other side shows less weight shift    Turn 360 Degrees Able to turn 360 degrees safely but slowly    Standing Unsupported, Alternately Place Feet on Step/Stool Able to complete 4 steps without aid or supervision    Standing Unsupported, One Foot in Front Able to take small  step independently and hold 30 seconds   Pt actually completed 10 taps but definitiely needed assisatnce to keep from falling.   Standing on One Leg Tries to lift leg/unable to hold 3 seconds but remains standing independently    Total Score 42      Timed Up and Go Test   Normal TUG (seconds) 16.11                         [facility] Adult PT Treatment/Exercise - 08/09/20 0001      Knee/Hip Exercises: Aerobic   Nustep L5 x 6 min, :L4 x 4 min UE only                    PT Short Term Goals - 08/09/20 5643      PT SHORT TERM GOAL #1   Title The patient will be able to walk 240 feet with RW needed for improved mobility at her new assisted living residence    Time 6    Period Weeks    Status Achieved      PT SHORT TERM GOAL #2   Title The patient will have improved LE strength with the ability to rise from a standard chair with min UE assist    Time 6    Period Weeks    Status Achieved      PT SHORT TERM GOAL #3   Title TUG test improved to 25 sec indicating improved gait speed    Baseline 16.11 seconds    Time 6    Period Weeks    Status Achieved      PT SHORT TERM GOAL #4   Title BERG balance test improved to 40/56 indicating decreased fall risk    Period Weeks    Status Achieved   42/56            PT Long Term Goals - 08/09/20 0900      PT LONG TERM GOAL #5   Title Timed up and Go score improved to 22 sec or less    Time 12    Period Weeks    Status Achieved   16.11                Plan - 08/09/20 0855    Clinical Impression Statement Pt has met all STG's this week; BERG improved to 42 pts and pt can rise from a chair independently  without her UE. Pt just found the place she lives at has a Nustep and she used it everyday this past week.This will be part of her final HEP which is one of her long term goals. Today patient ambulated 582 feet in 5 min using her RW. This certainly meets her distance walked long term goal in less time ( 5 min  vs 6 min).    Personal Factors and Comorbidities Age;Comorbidity 1;Comorbidity 2;Comorbidity 3+;Transportation;Time since onset of injury/illness/exacerbation    Comorbidities scoliosis with leg length discrepancy; bil knee/hip OA; cardiac history; HTN; reports incontinence issues related to cervical issues but states she can make adjustments in order to do pool therapy;    Examination-Activity Limitations Locomotion Level;Transfers;Lift;Hygiene/Grooming;Stand;Stairs;Sleep    Examination-Participation Restrictions Cleaning;Community Activity;Shop;Meal Prep    Stability/Clinical Decision Making Evolving/Moderate complexity    Rehab Potential Good    PT Frequency 2x / week    PT Duration 12 weeks    PT Treatment/Interventions ADLs/Self Care Home Management;Aquatic Therapy;Electrical Stimulation;Cryotherapy;Moist Heat;Therapeutic activities;Therapeutic exercise;Neuromuscular re-education;Manual techniques;Patient/family education;Taping    PT Next Visit Plan UE/LE strength, endurance, balance    PT Home Exercise Plan [id number]    Consulted and Agree with Plan of Care Patient           Patient will benefit from skilled therapeutic intervention in order to improve the following deficits and impairments:  Difficulty walking,Pain,Decreased balance,Decreased strength,Impaired perceived functional ability  Visit Diagnosis: Unsteady gait  Pain in left hip  Muscle weakness (generalized)  Pain in right hip  Difficulty in walking, not elsewhere classified     Problem List Patient Active Problem List   Diagnosis Date Noted  . Palpitations 05/21/2020  . Coronary artery disease involving native coronary artery of native heart without angina pectoris 05/21/2020  . Near syncope 05/05/2020  . Seasonal affective disorder ([place]) 04/22/2020  . Bipolar 1 disorder, depressed ([place]) 03/01/2018  . HTN (hypertension) 03/01/2018  . Hypothyroid 03/01/2018  . Pernicious anemia 03/01/2018  . Osteoporosis  03/01/2018  . Macular degeneration 03/01/2018    Myrene Galas, PTA 08/09/20 9:16 AM  [facility] [address], [place], [place], [address] Phone: [phone]   Fax:  [phone]  Name: Jackie Jones MRN: [id number] Date of Birth: [birth year]

## 2020-08-11 ENCOUNTER — Other Ambulatory Visit: Payer: Self-pay

## 2020-08-11 ENCOUNTER — Ambulatory Visit: Payer: Medicare Other

## 2020-08-11 DIAGNOSIS — M25552 Pain in left hip: Secondary | ICD-10-CM

## 2020-08-11 DIAGNOSIS — R262 Difficulty in walking, not elsewhere classified: Secondary | ICD-10-CM | POA: Diagnosis not present

## 2020-08-11 DIAGNOSIS — R2681 Unsteadiness on feet: Secondary | ICD-10-CM

## 2020-08-11 DIAGNOSIS — M6281 Muscle weakness (generalized): Secondary | ICD-10-CM

## 2020-08-11 DIAGNOSIS — M25551 Pain in right hip: Secondary | ICD-10-CM

## 2020-08-11 NOTE — Therapy (Signed)
[facility] [address] [place], [place], [address] Phone: [phone]   Fax:  [phone]  Physical Therapy Treatment  Patient Details  Name: Jackie Jones MRN: [id number] Date of Birth: 21-Oct-1945 Referring Provider (PT): Windell Moulding NP   Encounter Date: 08/11/2020   PT End of Session - 08/11/20 0929    Visit Number 11    Date for PT Re-Evaluation 09/16/20    Authorization Type Medicare    Progress Note Due on Visit 10    PT Start Time 0845    PT Stop Time 0927    PT Time Calculation (min) 42 min    Activity Tolerance Patient tolerated treatment well    Behavior During Therapy [facility] for tasks assessed/performed           Past Medical History:  Diagnosis Date  . Allergy   . Arthritis   . Bipolar 1 disorder, depressed ([place])   . Bipolar disorder ([place])   . Chicken pox   . Dementia ([place])   . Depression   . Hashimoto's thyroiditis   . History of blood transfusion   . History of bone density study 2019  . History of colonoscopy 2015  . History of CT scan 2019  . History of mammogram 2019  . History of MRI   . History of Papanicolaou smear of cervix   . Hx: UTI (urinary tract infection)   . Hypertension   . Hypothyroidism   . Lactose intolerance   . Legally blind in left eye, as defined in [place]   . Non-celiac gluten sensitivity   . Osteoporosis   . Pernicious anemia   . Pernicious anemia   . Rheumatic fever   . Thyroid disease   . Urinary and fecal incontinence     Past Surgical History:  Procedure Laterality Date  . ABDOMINAL HYSTERECTOMY     1981  . APPENDECTOMY  1981  . LEFT HEART CATH AND CORONARY ANGIOGRAPHY N/A 05/10/2020   Procedure: LEFT HEART CATH AND CORONARY ANGIOGRAPHY;  Surgeon: Nelva Bush, MD;  Location: [place] [facility];  Service: Cardiovascular;  Laterality: N/A;  . prolapsed rectum 2015    . TONSILLECTOMY AND ADENOIDECTOMY  1966  . TUBAL LIGATION      There were  no vitals filed for this visit.   Subjective Assessment - 08/11/20 0853    Subjective I have been doing the NuStep daily.    Pertinent History goes by either "Jackie Jones" or "Jackie Jones";  heart arthymia; HTN; multi joint OA especially shoulders, knees and hips; scoliosis with signifcant leg length discrepancy; Bipolar 1    Currently in Pain? No/denies                             [facility] Adult PT Treatment/Exercise - 08/11/20 0001      Knee/Hip Exercises: Aerobic   Nustep L5 x 5  min, :L4 x 5 min UE only      Knee/Hip Exercises: Standing   Heel Raises Both;1 set;20 reps   RT foot on teal pod; requires UE for balance     Knee/Hip Exercises: Seated   Long Arc Quad Strengthening;Both;1 set;10 reps;Weights    Long Arc Quad Weight 2 lbs.    Ball Squeeze 5" hold x 20    Clamshell with TheraBand Yellow   yellow loop x 20   Marching Strengthening;Both;20 reps;2 sets   tap edge of treadmill   Marching Limitations  yellow band around thighs seated    Hamstring Curl Strengthening;Both    Hamstring Limitations yellow loop                    PT Short Term Goals - 08/09/20 5427      PT SHORT TERM GOAL #1   Title The patient will be able to walk 240 feet with RW needed for improved mobility at her new assisted living residence    Time 6    Period Weeks    Status Achieved      PT SHORT TERM GOAL #2   Title The patient will have improved LE strength with the ability to rise from a standard chair with min UE assist    Time 6    Period Weeks    Status Achieved      PT SHORT TERM GOAL #3   Title TUG test improved to 25 sec indicating improved gait speed    Baseline 16.11 seconds    Time 6    Period Weeks    Status Achieved      PT SHORT TERM GOAL #4   Title BERG balance test improved to 40/56 indicating decreased fall risk    Period Weeks    Status Achieved   42/56            PT Long Term Goals - 08/09/20 0900      PT LONG TERM GOAL #5   Title Timed up and Go  score improved to 22 sec or less    Time 12    Period Weeks    Status Achieved   16.11                Plan - 08/11/20 0934    Clinical Impression Statement Pt has met all STG's this week; BERG was improved to 42 pts last session and pt can rise from a chair independently without her UE. Pt is doing the NuStep daily at home x 30 minutes and PT discussed realistic goals for this moving forward.This will be part of her final HEP which is one of her long term goals. Last session, patient ambulated 582 feet in 5 min using her RW, meeting the goal.  Pt required supervision and stand by assistance for safety and verbal cueing for technique with exercise today.  Pt will continue to benefit from skilled PT to address strength, balance and safe mobility.    PT Frequency 2x / week    PT Duration 12 weeks    PT Treatment/Interventions ADLs/Self Care Home Management;Aquatic Therapy;Electrical Stimulation;Cryotherapy;Moist Heat;Therapeutic activities;Therapeutic exercise;Neuromuscular re-education;Manual techniques;Patient/family education;Taping    PT Next Visit Plan UE/LE strength, endurance, balance    PT Home Exercise Plan [email or web address]    Consulted and Agree with Plan of Care Patient           Patient will benefit from skilled therapeutic intervention in order to improve the following deficits and impairments:  Difficulty walking,Pain,Decreased balance,Decreased strength,Impaired perceived functional ability  Visit Diagnosis: Unsteady gait  Pain in left hip  Muscle weakness (generalized)  Pain in right hip  Difficulty in walking, not elsewhere classified     Problem List Patient Active Problem List   Diagnosis Date Noted  . Palpitations 05/21/2020  . Coronary artery disease involving native coronary artery of native heart without angina pectoris 05/21/2020  . Near syncope 05/05/2020  . Seasonal affective disorder ([place]) 04/22/2020  . Bipolar 1 disorder, depressed ([place]) 03/01/2018   . HTN (hypertension) 03/01/2018  .  Hypothyroid 03/01/2018  . Pernicious anemia 03/01/2018  . Osteoporosis 03/01/2018  . Macular degeneration 03/01/2018    Sigurd Sos, PT 08/11/20 9:36 AM  [facility] [address], [place], [place], [address] Phone: [phone]   Fax:  [phone]  Name: Jackie Jones MRN: [id number] Date of Birth: Jan 05, 1946

## 2020-08-18 ENCOUNTER — Other Ambulatory Visit: Payer: Self-pay

## 2020-08-18 ENCOUNTER — Ambulatory Visit: Payer: Medicare Other | Attending: Orthopedic Surgery

## 2020-08-18 DIAGNOSIS — R262 Difficulty in walking, not elsewhere classified: Secondary | ICD-10-CM | POA: Insufficient documentation

## 2020-08-18 DIAGNOSIS — R2681 Unsteadiness on feet: Secondary | ICD-10-CM | POA: Insufficient documentation

## 2020-08-18 DIAGNOSIS — M6281 Muscle weakness (generalized): Secondary | ICD-10-CM | POA: Diagnosis not present

## 2020-08-18 DIAGNOSIS — M25551 Pain in right hip: Secondary | ICD-10-CM | POA: Diagnosis not present

## 2020-08-18 DIAGNOSIS — M25552 Pain in left hip: Secondary | ICD-10-CM | POA: Insufficient documentation

## 2020-08-18 NOTE — Therapy (Signed)
[facility] [address], [place], [place], [address] Phone: [phone]   Fax:  [phone]  Physical Therapy Treatment  Patient Details  Name: Jackie Jones MRN: [id number] Date of Birth: January 21, 1946 Referring Provider (PT): Windell Moulding NP   Encounter Date: 08/18/2020   PT End of Session - 08/18/20 0840    Visit Number 12    Date for PT Re-Evaluation 09/16/20    Authorization Type Medicare    Progress Note Due on Visit 10    PT Start Time 0800    PT Stop Time 0842    PT Time Calculation (min) 42 min    Activity Tolerance Patient tolerated treatment well    Behavior During Therapy [facility] for tasks assessed/performed           Past Medical History:  Diagnosis Date  . Allergy   . Arthritis   . Bipolar 1 disorder, depressed ([place])   . Bipolar disorder ([place])   . Chicken pox   . Dementia ([place])   . Depression   . Hashimoto's thyroiditis   . History of blood transfusion   . History of bone density study 2019  . History of colonoscopy 2015  . History of CT scan 2019  . History of mammogram 2019  . History of MRI   . History of Papanicolaou smear of cervix   . Hx: UTI (urinary tract infection)   . Hypertension   . Hypothyroidism   . Lactose intolerance   . Legally blind in left eye, as defined in [place]   . Non-celiac gluten sensitivity   . Osteoporosis   . Pernicious anemia   . Pernicious anemia   . Rheumatic fever   . Thyroid disease   . Urinary and fecal incontinence     Past Surgical History:  Procedure Laterality Date  . ABDOMINAL HYSTERECTOMY     1981  . APPENDECTOMY  1981  . LEFT HEART CATH AND CORONARY ANGIOGRAPHY N/A 05/10/2020   Procedure: LEFT HEART CATH AND CORONARY ANGIOGRAPHY;  Surgeon: Nelva Bush, MD;  Location: [facility];  Service: Cardiovascular;  Laterality: N/A;  . prolapsed rectum 2015    . TONSILLECTOMY AND ADENOIDECTOMY  1966  . TUBAL LIGATION      There were  no vitals filed for this visit.   Subjective Assessment - 08/18/20 0803    Subjective I've been doing the seated stepper daily  and I'm up to level 5-6.    Pertinent History goes by either "Jackie Jones" or "Jackie Jones";  heart arthymia; HTN; multi joint OA especially shoulders, knees and hips; scoliosis with signifcant leg length discrepancy; Bipolar 1    Currently in Pain? No/denies                             [facility] Adult PT Treatment/Exercise - 08/18/20 0001      Knee/Hip Exercises: Aerobic   Nustep L5 x 5  min, :L4 x 5 min LE only      Knee/Hip Exercises: Standing   Heel Raises Both;1 set;20 reps   RT foot on teal pod; requires UE for balance   Other Standing Knee Exercises alternating step taps on low cones with moderate UE supportx 1 UE    Other Standing Knee Exercises weightshifting medial/lateral: Rt foot on pod x 20      Knee/Hip Exercises: Seated   Long Arc Quad Strengthening;Both;1 set;10 reps;Weights    Long [employer]  Weight 2 lbs.    Ball Squeeze 5" hold x 60    Clamshell with TheraBand Yellow   yellow loop x 20   Marching Strengthening;Both;20 reps;3 sets   tap edge of treadmill   Marching Limitations yellow band around thighs seated    Hamstring Curl Strengthening;Both    Hamstring Limitations yellow loop                    PT Short Term Goals - 08/09/20 3235      PT SHORT TERM GOAL #1   Title The patient will be able to walk 240 feet with RW needed for improved mobility at her new assisted living residence    Time 6    Period Weeks    Status Achieved      PT SHORT TERM GOAL #2   Title The patient will have improved LE strength with the ability to rise from a standard chair with min UE assist    Time 6    Period Weeks    Status Achieved      PT SHORT TERM GOAL #3   Title TUG test improved to 25 sec indicating improved gait speed    Baseline 16.11 seconds    Time 6    Period Weeks    Status Achieved      PT SHORT TERM GOAL #4   Title  BERG balance test improved to 40/56 indicating decreased fall risk    Period Weeks    Status Achieved   42/56            PT Long Term Goals - 08/09/20 0900      PT LONG TERM GOAL #5   Title Timed up and Go score improved to 22 sec or less    Time 12    Period Weeks    Status Achieved   16.11                Plan - 08/18/20 0815    Clinical Impression Statement Pt continues to use the seated stepper daily at home x 30 minutes and has been able to increase the level of resistance. Pt continues to be challenged by current level of exercise in the clinic and is monitored for symptoms of a-fib.  Pt required supervision and stand by assistance for safety and verbal cueing for technique with exercise today. Pt did increased reps of exercises today and tolerated well.   Pt will continue to benefit from skilled PT to address strength, balance and safe mobility.    PT Frequency 2x / week    PT Duration 12 weeks    PT Treatment/Interventions ADLs/Self Care Home Management;Aquatic Therapy;Electrical Stimulation;Cryotherapy;Moist Heat;Therapeutic activities;Therapeutic exercise;Neuromuscular re-education;Manual techniques;Patient/family education;Taping    PT Next Visit Plan UE/LE strength, endurance, balance.  Pt will begin aquatics next week    PT Home Exercise Plan [id number]    Consulted and Agree with Plan of Care Patient           Patient will benefit from skilled therapeutic intervention in order to improve the following deficits and impairments:  Difficulty walking,Pain,Decreased balance,Decreased strength,Impaired perceived functional ability  Visit Diagnosis: Pain in left hip  Unsteady gait  Muscle weakness (generalized)  Pain in right hip  Difficulty in walking, not elsewhere classified     Problem List Patient Active Problem List   Diagnosis Date Noted  . Palpitations 05/21/2020  . Coronary artery disease involving native coronary artery of native heart without  angina pectoris  05/21/2020  . Near syncope 05/05/2020  . Seasonal affective disorder ([place]) 04/22/2020  . Bipolar 1 disorder, depressed ([place]) 03/01/2018  . HTN (hypertension) 03/01/2018  . Hypothyroid 03/01/2018  . Pernicious anemia 03/01/2018  . Osteoporosis 03/01/2018  . Macular degeneration 03/01/2018    Sigurd Sos, PT 08/18/20 8:43 AM  [facility] [address], [place], [place], [address] Phone: [phone]   Fax:  [phone]  Name: Jackie Jones MRN: [id number] Date of Birth: Jul 23, 1945

## 2020-08-24 ENCOUNTER — Other Ambulatory Visit: Payer: Self-pay

## 2020-08-24 ENCOUNTER — Ambulatory Visit: Payer: Medicare Other

## 2020-08-24 DIAGNOSIS — M25551 Pain in right hip: Secondary | ICD-10-CM

## 2020-08-24 DIAGNOSIS — R262 Difficulty in walking, not elsewhere classified: Secondary | ICD-10-CM

## 2020-08-24 DIAGNOSIS — R2681 Unsteadiness on feet: Secondary | ICD-10-CM | POA: Diagnosis not present

## 2020-08-24 DIAGNOSIS — M25552 Pain in left hip: Secondary | ICD-10-CM | POA: Diagnosis not present

## 2020-08-24 DIAGNOSIS — M6281 Muscle weakness (generalized): Secondary | ICD-10-CM

## 2020-08-24 NOTE — Therapy (Signed)
[facility] [address] [place], [place], [address] Phone: [phone]   Fax:  [phone]  Physical Therapy Treatment  Patient Details  Name: Jackie Jones MRN: [id number] Date of Birth: 07/17/1945 Referring Provider (PT): Windell Moulding NP   Encounter Date: 08/24/2020   PT End of Session - 08/24/20 0928    Visit Number 13    Date for PT Re-Evaluation 09/16/20    Authorization Type Medicare- KX at 15    Progress Note Due on Visit 20    PT Start Time 0845    PT Stop Time 0928    PT Time Calculation (min) 43 min    Activity Tolerance Patient tolerated treatment well    Behavior During Therapy [facility] for tasks assessed/performed           Past Medical History:  Diagnosis Date  . Allergy   . Arthritis   . Bipolar 1 disorder, depressed ([place])   . Bipolar disorder ([place])   . Chicken pox   . Dementia ([place])   . Depression   . Hashimoto's thyroiditis   . History of blood transfusion   . History of bone density study 2019  . History of colonoscopy 2015  . History of CT scan 2019  . History of mammogram 2019  . History of MRI   . History of Papanicolaou smear of cervix   . Hx: UTI (urinary tract infection)   . Hypertension   . Hypothyroidism   . Lactose intolerance   . Legally blind in left eye, as defined in [place]   . Non-celiac gluten sensitivity   . Osteoporosis   . Pernicious anemia   . Pernicious anemia   . Rheumatic fever   . Thyroid disease   . Urinary and fecal incontinence     Past Surgical History:  Procedure Laterality Date  . ABDOMINAL HYSTERECTOMY     1981  . APPENDECTOMY  1981  . LEFT HEART CATH AND CORONARY ANGIOGRAPHY N/A 05/10/2020   Procedure: LEFT HEART CATH AND CORONARY ANGIOGRAPHY;  Surgeon: Nelva Bush, MD;  Location: [facility];  Service: Cardiovascular;  Laterality: N/A;  . prolapsed rectum 2015    . TONSILLECTOMY AND ADENOIDECTOMY  1966  . TUBAL LIGATION       There were no vitals filed for this visit.   Subjective Assessment - 08/24/20 0907    Subjective I haven't been doing the stepper.  I got discouraged because I was afraid to walk due to seeing a fox outside.    Pertinent History goes by either "[name]" or "[name]";  heart arthymia; HTN; multi joint OA especially shoulders, knees and hips; scoliosis with signifcant leg length discrepancy; Bipolar 1    Patient Stated Goals I'd like to walk again (eventually walked 4 miles); get strength in legs for steps (my sister had good PT and taught me to do 1 inch step ups on book.)  Move around safely    Currently in Pain? No/denies                             [facility] Adult PT Treatment/Exercise - 08/24/20 0001      Knee/Hip Exercises: Aerobic   Nustep L5 x 5  min, :L4 x 5 min LE only      Knee/Hip Exercises: Standing   Heel Raises Both;1 set;20 reps   Rt foot on teal pod; requires UE for balance   Other  Standing Knee Exercises alternating step taps on low cones with moderate UE supportx 1 UE    Other Standing Knee Exercises weightshifting medial/lateral: Rt foot on pod x 20      Knee/Hip Exercises: Seated   Long Arc Quad Strengthening;Both;1 set;10 reps;Weights    Long Arc Quad Weight 2 lbs.    Clamshell with TheraBand Yellow   yellow loop x 20   Marching Strengthening;Both;20 reps;3 sets   tap edge of treadmill   Marching Limitations yellow band around thighs seated    Hamstring Curl Strengthening;Both    Hamstring Limitations yellow loop                    PT Short Term Goals - 08/09/20 0347      PT SHORT TERM GOAL #1   Title The patient will be able to walk 240 feet with RW needed for improved mobility at her new assisted living residence    Time 6    Period Weeks    Status Achieved      PT SHORT TERM GOAL #2   Title The patient will have improved LE strength with the ability to rise from a standard chair with min UE assist    Time 6    Period Weeks     Status Achieved      PT SHORT TERM GOAL #3   Title TUG test improved to 25 sec indicating improved gait speed    Baseline 16.11 seconds    Time 6    Period Weeks    Status Achieved      PT SHORT TERM GOAL #4   Title BERG balance test improved to 40/56 indicating decreased fall risk    Period Weeks    Status Achieved   42/56            PT Long Term Goals - 08/09/20 0900      PT LONG TERM GOAL #5   Title Timed up and Go score improved to 22 sec or less    Time 12    Period Weeks    Status Achieved   16.11                Plan - 08/24/20 0912    Clinical Impression Statement Pt has not done the stepper since last session.  She was walking and saw a fox outside and became discouraged by that she isn't safe to walk outside.  Pt continues to be challenged by current level of exercise in the clinic and is monitored for symptoms of a-fib.  Pt required supervision and stand by assistance for safety and verbal cueing for technique with exercise today. Pt did increased reps of exercises today and tolerated well.  Pt will transition to the pool this week.  ERO is scheduled on land at the end of the month.  Pt will continue to benefit from skilled PT to address strength,  endurance, balance, and safe mobility.    Rehab Potential Good    PT Frequency 2x / week    PT Duration 12 weeks    PT Treatment/Interventions ADLs/Self Care Home Management;Aquatic Therapy;Electrical Stimulation;Cryotherapy;Moist Heat;Therapeutic activities;Therapeutic exercise;Neuromuscular re-education;Manual techniques;Patient/family education;Taping    PT Next Visit Plan UE/LE strength, endurance, balance.  Pt will begin aquatics this week to improve endurance and mobility.    PT Home Exercise Plan [id number]    Recommended Other Services initial cert    Consulted and Agree with Plan of Care Patient  Patient will benefit from skilled therapeutic intervention in order to improve the following deficits  and impairments:  Difficulty walking,Pain,Decreased balance,Decreased strength,Impaired perceived functional ability  Visit Diagnosis: Pain in left hip  Unsteady gait  Muscle weakness (generalized)  Pain in right hip  Difficulty in walking, not elsewhere classified     Problem List Patient Active Problem List   Diagnosis Date Noted  . Palpitations 05/21/2020  . Coronary artery disease involving native coronary artery of native heart without angina pectoris 05/21/2020  . Near syncope 05/05/2020  . Seasonal affective disorder ([place]) 04/22/2020  . Bipolar 1 disorder, depressed ([place]) 03/01/2018  . HTN (hypertension) 03/01/2018  . Hypothyroid 03/01/2018  . Pernicious anemia 03/01/2018  . Osteoporosis 03/01/2018  . Macular degeneration 03/01/2018    Sigurd Sos, PT 08/24/20 9:29 AM  [facility] [address], [place], [place], [address] Phone: [phone]   Fax:  [phone]  Name: Hetvi Shawhan MRN: [id number] Date of Birth: 1945-05-15

## 2020-08-26 ENCOUNTER — Ambulatory Visit (INDEPENDENT_AMBULATORY_CARE_PROVIDER_SITE_OTHER): Payer: Medicare Other | Admitting: Psychology

## 2020-08-26 DIAGNOSIS — F332 Major depressive disorder, recurrent severe without psychotic features: Secondary | ICD-10-CM | POA: Diagnosis not present

## 2020-08-27 ENCOUNTER — Encounter: Payer: Self-pay | Admitting: Physical Therapy

## 2020-08-27 ENCOUNTER — Other Ambulatory Visit: Payer: Self-pay

## 2020-08-27 ENCOUNTER — Ambulatory Visit: Payer: Medicare Other | Admitting: Physical Therapy

## 2020-08-27 DIAGNOSIS — M6281 Muscle weakness (generalized): Secondary | ICD-10-CM | POA: Diagnosis not present

## 2020-08-27 DIAGNOSIS — R262 Difficulty in walking, not elsewhere classified: Secondary | ICD-10-CM

## 2020-08-27 DIAGNOSIS — M25552 Pain in left hip: Secondary | ICD-10-CM

## 2020-08-27 DIAGNOSIS — R2681 Unsteadiness on feet: Secondary | ICD-10-CM | POA: Diagnosis not present

## 2020-08-27 DIAGNOSIS — M25551 Pain in right hip: Secondary | ICD-10-CM | POA: Diagnosis not present

## 2020-08-27 NOTE — Therapy (Signed)
[facility] [address] [place], [place], [place], [address] Phone: [phone]   Fax:  [phone]  Physical Therapy Treatment  Patient Details  Name: Jackie Jones MRN: [id number] Date of Birth: 04-04-1945 Referring Provider (PT): Windell Moulding NP   Encounter Date: 08/27/2020   PT End of Session - 08/27/20 1604     Visit Number 14    Date for PT Re-Evaluation 09/16/20    Authorization Type Medicare- KX at 15    Progress Note Due on Visit 20    PT Start Time 1210    PT Stop Time 0932    PT Time Calculation (min) 45 min    Activity Tolerance Patient tolerated treatment well    Behavior During Therapy [facility] for tasks assessed/performed             Past Medical History:  Diagnosis Date   Allergy    Arthritis    Bipolar 1 disorder, depressed ([place])    Bipolar disorder ([place])    Chicken pox    Dementia ([place])    Depression    Hashimoto's thyroiditis    History of blood transfusion    History of bone density study 2019   History of colonoscopy 2015   History of CT scan 2019   History of mammogram 2019   History of MRI    History of Papanicolaou smear of cervix    Hx: UTI (urinary tract infection)    Hypertension    Hypothyroidism    Lactose intolerance    Legally blind in left eye, as defined in [place]    Non-celiac gluten sensitivity    Osteoporosis    Pernicious anemia    Pernicious anemia    Rheumatic fever    Thyroid disease    Urinary and fecal incontinence     Past Surgical History:  Procedure Laterality Date   [place] CATH AND CORONARY ANGIOGRAPHY N/A 05/10/2020   Procedure: LEFT HEART CATH AND CORONARY ANGIOGRAPHY;  Surgeon: Nelva Bush, MD;  Location: [facility];  Service: Cardiovascular;  Laterality: N/A;   prolapsed rectum 2015     TONSILLECTOMY AND ADENOIDECTOMY  1966   TUBAL LIGATION      There were no vitals filed  for this visit.   Subjective Assessment - 08/27/20 1602     Subjective I feel good today. No complaints.    Pertinent History goes by either "[name]" or "[name]";  heart arthymia; HTN; multi joint OA especially shoulders, knees and hips; scoliosis with signifcant leg length discrepancy; Bipolar 1    Currently in Pain? No/denies    Multiple Pain Sites No            Treatment: Aquatics Patient seen for aquatic therapy today.  Treatment took place in water 3.5-4.5 feet deep depending upon activity.  Pt entered the pool via steps slowly and step to step with heavy use of rails. Water temp 90 degrees F.  Seated water bench with 75% submersion Pt performed seated LE AROM exercises 20x in all planes, PTA educated pt in water principles and how we would use them. Pt verbally understood. Multicolored UE water weights for shoulder horizontal abd/add 20x 2. Scap depression off bench 10x. Attempted sit to stand: pt needed extra time to find her balance and PTA provided CGA- minA for balance.   Water walking mid waist to lower chest depth with thick blue  square noodle: 4x ea direction, 1 loss of balance posterior during side stepping. Pt needed assistance to regain balance.  Standing in mid chest depth: mini squats 10x: RTLE standing on water belt to equal out legs. PTA also stepped on the belt to help keep it from floating up.   4 forward walks with emphasis on turning 180 degrees for balance. Used noodle again for balance. Close SBA secondary to fatigue.                             PT Education - 08/27/20 1603     Education Details Water principles    Person(s) Educated Patient    Methods Explanation    Comprehension Verbalized understanding;Returned demonstration              PT Short Term Goals - 08/09/20 0833       PT SHORT TERM GOAL #1   Title The patient will be able to walk 240 feet with RW needed for improved mobility at her new assisted living residence     Time 6    Period Weeks    Status Achieved      PT SHORT TERM GOAL #2   Title The patient will have improved LE strength with the ability to rise from a standard chair with min UE assist    Time 6    Period Weeks    Status Achieved      PT SHORT TERM GOAL #3   Title TUG test improved to 25 sec indicating improved gait speed    Baseline 16.11 seconds    Time 6    Period Weeks    Status Achieved      PT SHORT TERM GOAL #4   Title BERG balance test improved to 40/56 indicating decreased fall risk    Period Weeks    Status Achieved   42/56              PT Long Term Goals - 08/09/20 0900       PT LONG TERM GOAL #5   Title Timed up and Go score improved to 22 sec or less    Time 12    Period Weeks    Status Achieved   16.11                  Plan - 08/27/20 1604     Clinical Impression Statement Pt arrived for her first aquatic PT visit. Pt has no pain but remains with balance challenges. Pt was educated on water principles and how we would be using them. Pt requires flotation support for all aquatic mobility and TEs. Pt had 1 loss of balance with lateral stepping and had difficultymoving her arms while standing and keeping her balance. Pt verbally reported feeling good in the water and it was nice "to feel like I was walking almost normal."    Personal Factors and Comorbidities Age;Comorbidity 1;Comorbidity 2;Comorbidity 3+;Transportation;Time since onset of injury/illness/exacerbation    Comorbidities scoliosis with leg length discrepancy; bil knee/hip OA; cardiac history; HTN; reports incontinence issues related to cervical issues but states she can make adjustments in order to do pool therapy;    Examination-Activity Limitations Locomotion Level;Transfers;Lift;Hygiene/Grooming;Stand;Stairs;Sleep    Examination-Participation Restrictions Cleaning;Community Activity;Shop;Meal Prep    Rehab Potential Good    PT Frequency 2x / week    PT Duration 12 weeks    PT  Treatment/Interventions ADLs/Self Care Home Management;Aquatic Therapy;Electrical Stimulation;Cryotherapy;Moist Heat;Therapeutic  activities;Therapeutic exercise;Neuromuscular re-education;Manual techniques;Patient/family education;Taping    PT Next Visit Plan Continue with aquatics,    PT Home Exercise Plan [id number]    Consulted and Agree with Plan of Care Patient             Patient will benefit from skilled therapeutic intervention in order to improve the following deficits and impairments:  Difficulty walking, Pain, Decreased balance, Decreased strength, Impaired perceived functional ability  Visit Diagnosis: Pain in left hip  Unsteady gait  Muscle weakness (generalized)  Pain in right hip  Difficulty in walking, not elsewhere classified     Problem List Patient Active Problem List   Diagnosis Date Noted   Palpitations 05/21/2020   Coronary artery disease involving native coronary artery of native heart without angina pectoris 05/21/2020   Near syncope 05/05/2020   Seasonal affective disorder ([place]) 04/22/2020   Bipolar 1 disorder, depressed ([place]) 03/01/2018   HTN (hypertension) 03/01/2018   Hypothyroid 03/01/2018   Pernicious anemia 03/01/2018   Osteoporosis 03/01/2018   Macular degeneration 03/01/2018    Austynn Pridmore, PTA 08/27/2020, 4:10 PM  [facility] [address] [place], [place], [address] Phone: [phone]   Fax:  [phone]  Name: Jackie Jones MRN: [id number] Date of Birth: Oct 04, 1945

## 2020-08-31 ENCOUNTER — Other Ambulatory Visit: Payer: Self-pay

## 2020-08-31 ENCOUNTER — Ambulatory Visit: Payer: Medicare Other | Admitting: Orthopedic Surgery

## 2020-08-31 ENCOUNTER — Other Ambulatory Visit: Payer: Medicare Other

## 2020-08-31 DIAGNOSIS — E038 Other specified hypothyroidism: Secondary | ICD-10-CM | POA: Diagnosis not present

## 2020-08-31 DIAGNOSIS — I10 Essential (primary) hypertension: Secondary | ICD-10-CM | POA: Diagnosis not present

## 2020-08-31 DIAGNOSIS — F319 Bipolar disorder, unspecified: Secondary | ICD-10-CM | POA: Diagnosis not present

## 2020-08-31 DIAGNOSIS — E063 Autoimmune thyroiditis: Secondary | ICD-10-CM | POA: Diagnosis not present

## 2020-08-31 DIAGNOSIS — D510 Vitamin B12 deficiency anemia due to intrinsic factor deficiency: Secondary | ICD-10-CM

## 2020-09-01 LAB — BASIC METABOLIC PANEL
BUN/Creatinine Ratio: 29 (calc) — ABNORMAL HIGH (ref 6–22)
BUN: 31 mg/dL — ABNORMAL HIGH (ref 7–25)
CO2: 21 mmol/L (ref 20–32)
Calcium: 9.4 mg/dL (ref 8.6–10.4)
Chloride: 101 mmol/L (ref 98–110)
Creat: 1.06 mg/dL — ABNORMAL HIGH (ref 0.60–0.93)
Glucose, Bld: 124 mg/dL (ref 65–139)
Potassium: 4.9 mmol/L (ref 3.5–5.3)
Sodium: 133 mmol/L — ABNORMAL LOW (ref 135–146)

## 2020-09-01 LAB — CBC WITH DIFFERENTIAL/PLATELET
Absolute Monocytes: 496 cells/uL (ref 200–950)
Basophils Absolute: 81 cells/uL (ref 0–200)
Basophils Relative: 1.1 %
Eosinophils Absolute: 326 cells/uL (ref 15–500)
Eosinophils Relative: 4.4 %
HCT: 31.4 % — ABNORMAL LOW (ref 35.0–45.0)
Hemoglobin: 10.6 g/dL — ABNORMAL LOW (ref 11.7–15.5)
Lymphs Abs: 1613 cells/uL (ref 850–3900)
MCH: 29.8 pg (ref 27.0–33.0)
MCHC: 33.8 g/dL (ref 32.0–36.0)
MCV: 88.2 fL (ref 80.0–100.0)
MPV: 10.4 fL (ref 7.5–12.5)
Monocytes Relative: 6.7 %
Neutro Abs: 4884 cells/uL (ref 1500–7800)
Neutrophils Relative %: 66 %
Platelets: 283 10*3/uL (ref 140–400)
RBC: 3.56 10*6/uL — ABNORMAL LOW (ref 3.80–5.10)
RDW: 13.1 % (ref 11.0–15.0)
Total Lymphocyte: 21.8 %
WBC: 7.4 10*3/uL (ref 3.8–10.8)

## 2020-09-01 LAB — HEPATIC FUNCTION PANEL
AG Ratio: 2 (calc) (ref 1.0–2.5)
ALT: 14 U/L (ref 6–29)
AST: 20 U/L (ref 10–35)
Albumin: 4.2 g/dL (ref 3.6–5.1)
Alkaline phosphatase (APISO): 67 U/L (ref 37–153)
Bilirubin, Direct: 0.1 mg/dL (ref 0.0–0.2)
Globulin: 2.1 g/dL (calc) (ref 1.9–3.7)
Indirect Bilirubin: 0.2 mg/dL (calc) (ref 0.2–1.2)
Total Bilirubin: 0.3 mg/dL (ref 0.2–1.2)
Total Protein: 6.3 g/dL (ref 6.1–8.1)

## 2020-09-01 LAB — VALPROIC ACID LEVEL: Valproic Acid Lvl: 32.6 mg/L — ABNORMAL LOW (ref 50.0–100.0)

## 2020-09-01 LAB — TSH: TSH: 0.08 mIU/L — ABNORMAL LOW (ref 0.40–4.50)

## 2020-09-02 ENCOUNTER — Other Ambulatory Visit: Payer: Self-pay

## 2020-09-02 ENCOUNTER — Ambulatory Visit (INDEPENDENT_AMBULATORY_CARE_PROVIDER_SITE_OTHER): Payer: Medicare Other | Admitting: Orthopedic Surgery

## 2020-09-02 ENCOUNTER — Encounter: Payer: Self-pay | Admitting: Orthopedic Surgery

## 2020-09-02 VITALS — BP 116/58 | HR 94 | Temp 97.5°F | Ht 65.0 in | Wt 135.0 lb

## 2020-09-02 DIAGNOSIS — M25561 Pain in right knee: Secondary | ICD-10-CM

## 2020-09-02 DIAGNOSIS — E038 Other specified hypothyroidism: Secondary | ICD-10-CM | POA: Diagnosis not present

## 2020-09-02 DIAGNOSIS — E063 Autoimmune thyroiditis: Secondary | ICD-10-CM

## 2020-09-02 DIAGNOSIS — M25562 Pain in left knee: Secondary | ICD-10-CM | POA: Diagnosis not present

## 2020-09-02 DIAGNOSIS — R2681 Unsteadiness on feet: Secondary | ICD-10-CM

## 2020-09-02 DIAGNOSIS — F338 Other recurrent depressive disorders: Secondary | ICD-10-CM

## 2020-09-02 DIAGNOSIS — F319 Bipolar disorder, unspecified: Secondary | ICD-10-CM | POA: Diagnosis not present

## 2020-09-02 DIAGNOSIS — M810 Age-related osteoporosis without current pathological fracture: Secondary | ICD-10-CM

## 2020-09-02 DIAGNOSIS — I10 Essential (primary) hypertension: Secondary | ICD-10-CM | POA: Diagnosis not present

## 2020-09-02 DIAGNOSIS — D649 Anemia, unspecified: Secondary | ICD-10-CM | POA: Diagnosis not present

## 2020-09-02 MED ORDER — LEVOTHYROXINE SODIUM 112 MCG PO TABS: 112.0000 ug | ORAL_TABLET | Freq: Every day | ORAL | 3 refills | Status: DC

## 2020-09-02 NOTE — Patient Instructions (Signed)
Thyroid medication reduced to 112 mcg.   May take fosamax on empty stomach when taking thyroid.   Continue PT.

## 2020-09-02 NOTE — Progress Notes (Signed)
Careteam: Patient Care Team: Yvonna Alanis, NP as PCP - General (Adult Health Nurse Practitioner)  Seen by: Windell Moulding, AGNP-C  PLACE OF SERVICE:  [place]  Advanced Directive information    Allergies  Allergen Reactions   Contrast Media [Iodinated Diagnostic Agents] Itching   Trintellix [Vortioxetine] Other (See Comments)    Led to mania   Dairycare [Lactase-Lactobacillus]    Ciprofloxacin Rash   Codeine Rash   Levaquin [Levofloxacin] Rash   Penicillins Rash    No chief complaint on file.    HPI: Patient is a 75 y.o. female seen today for medical management of chronic conditions.   She has adjusted well since moving to [place]. Does not drive, uses [facility] uber services to take her to appointments. She manages her medications. Adjusting to being around more people, but overall happy with move.   Lab results reviewed with patient. Reports being dehydrated prior to lab work drawn.   Hgb down from 4 months ago. Denies blood in stool, hematuria or vaginal bleeding.   Continues to take depaokte. Denies mood changes, mania, depression or anxiety.   Thyroid level - TSH 0.08. Asymptomatic.   Asking if she can take fosamax early in the morning on sundays after thyroid medication.   Dermatologist- was seen for rash on hands. Recommended she see a allergist at [facility]. She believes she is allergic to soy. She stopped eating foods with soy and rash has improved.   Continues to do PT. Asking for a special shoe to help with leg lengths. Continues to ambulate with walker. No recent falls or injuries. Uses celebrex for knee pain.   She had second covid booster 06/24/2020.   Reports having shingles vaccine over 10 years ago.   She does not wish to have mammogram, colonoscopies or bone density tests anymore.   Review of Systems:  Review of Systems  Constitutional:  Negative for chills, fever, malaise/fatigue and weight loss.  HENT: Negative.    Eyes: Negative.   Respiratory:   Negative for cough, shortness of breath and wheezing.   Cardiovascular:  Negative for chest pain, palpitations and leg swelling.  Gastrointestinal:  Negative for abdominal pain, blood in stool, constipation, diarrhea, heartburn, nausea and vomiting.  Genitourinary:  Negative for dysuria, frequency and hematuria.  Musculoskeletal:  Positive for joint pain and myalgias. Negative for falls.  Skin:  Positive for rash.  Neurological:  Positive for weakness. Negative for dizziness and headaches.  Psychiatric/Behavioral:  Positive for depression. The patient is not nervous/anxious and does not have insomnia.    Past Medical History:  Diagnosis Date   Allergy    Arthritis    Bipolar 1 disorder, depressed ([place])    Bipolar disorder ([place])    Chicken pox    Dementia ([place])    Depression    Hashimoto's thyroiditis    History of blood transfusion    History of bone density study 2019   History of colonoscopy 2015   History of CT scan 2019   History of mammogram 2019   History of MRI    History of Papanicolaou smear of cervix    Hx: UTI (urinary tract infection)    Hypertension    Hypothyroidism    Lactose intolerance    Legally blind in left eye, as defined in [place]    Non-celiac gluten sensitivity    Osteoporosis    Pernicious anemia    Pernicious anemia    Rheumatic fever    Thyroid disease  Urinary and fecal incontinence    Past Surgical History:  Procedure Laterality Date   [place]   LEFT HEART CATH AND CORONARY ANGIOGRAPHY N/A 05/10/2020   Procedure: LEFT HEART CATH AND CORONARY ANGIOGRAPHY;  Surgeon: Nelva Bush, MD;  Location: [facility];  Service: Cardiovascular;  Laterality: N/A;   prolapsed rectum 2015     TONSILLECTOMY AND ADENOIDECTOMY  1966   TUBAL LIGATION     Social History:   reports that she has never smoked. She has never used smokeless tobacco. She reports that she does not drink alcohol and does not use  drugs.  Family History  Problem Relation Age of Onset   Heart disease Mother    Arthritis Mother    Cancer Father        bladder   Thyroid disease Father    Dementia Father    Hypertension Sister    Hypertension Brother    Hypertension Sister    Hypertension Sister    Diabetes Brother    Diabetes type II Son     Medications: Patient's Medications  New Prescriptions   No medications on file  Previous Medications   ALENDRONATE (FOSAMAX) 70 MG TABLET    TAKE 1 TABLET(70 MG) BY MOUTH 1 TIME A WEEK WITH A FULL GLASS OF WATER AND ON AN EMPTY STOMACH   AMLODIPINE (NORVASC) 10 MG TABLET    TAKE 1 TABLET(10 MG) BY MOUTH DAILY   CELECOXIB (CELEBREX) 50 MG CAPSULE    Take 1 capsule (50 mg total) by mouth 2 (two) times daily.   COVID-19 MRNA VACCINE, MODERNA, 100 MCG/0.5ML INJECTION    Inject into the muscle.   CYANOCOBALAMIN (,VITAMIN B-12,) 1000 MCG/ML INJECTION    INJECT 1 ML INTO THE MUSCLE EVERY 30 DAYS   DIVALPROEX (DEPAKOTE ER) 500 MG 24 HR TABLET    Take 1 tablet (500 mg total) by mouth at bedtime.   LEVOTHYROXINE (SYNTHROID) 125 MCG TABLET    TAKE 1 TABLET(125 MCG) BY MOUTH DAILY   MULTIPLE VITAMINS-MINERALS (PRESERVISION AREDS) CAPS    Take by mouth in the morning and at bedtime.   OLMESARTAN (BENICAR) 20 MG TABLET    TAKE 1 TABLET(20 MG) BY MOUTH DAILY   OVER THE COUNTER MEDICATION    Take 10 mg by mouth daily. AllerClear 10 mg 2 drops twice a day   OVER THE COUNTER MEDICATION    Apply 1 application topically as needed. DEEP BLUE SALVE   POLYETHYL GLYCOL-PROPYL GLYCOL 0.4-0.3 % SOLN    Place 1 drop into both eyes in the morning and at bedtime.   RESTASIS 0.05 % OPHTHALMIC EMULSION    Place 1 drop into both eyes 2 (two) times daily.   SYRINGE-NEEDLE, DISP, 3 ML (BD SAFETYGLIDE SYRINGE/NEEDLE) 25G X 1" 3 ML MISC    Inject 59ml in deltoid once monthly   VENLAFAXINE XR (EFFEXOR-XR) 150 MG 24 HR CAPSULE    Take 150 mg by mouth daily. Along with 75 mg   VENLAFAXINE XR (EFFEXOR-XR) 75 MG  24 HR CAPSULE    Take 75 mg by mouth daily at 6 (six) AM. Along with 150 mg  Modified Medications   No medications on file  Discontinued Medications   No medications on file    Physical Exam:  There were no vitals filed for this visit. There is no height or weight on file to calculate BMI. Wt Readings from Last 3 Encounters:  05/28/20 137 lb 12.8 oz (62.5 kg)  05/21/20 134 lb 12.8 oz (61.1 kg)  05/10/20 135 lb (61.2 kg)    Physical Exam Vitals reviewed.  Constitutional:      General: She is not in acute distress. HENT:     Head: Normocephalic.     Right Ear: There is no impacted cerumen.     Left Ear: There is no impacted cerumen.     Nose: Nose normal.     Mouth/Throat:     Mouth: Mucous membranes are moist.     Pharynx: No posterior oropharyngeal erythema.  Eyes:     General:        Right eye: No discharge.        Left eye: No discharge.  Neck:     Thyroid: No thyroid mass or thyromegaly.  Cardiovascular:     Rate and Rhythm: Normal rate and regular rhythm.     Pulses: Normal pulses.     Heart sounds: Normal heart sounds. No murmur heard. Pulmonary:     Effort: Pulmonary effort is normal. No respiratory distress.     Breath sounds: Normal breath sounds. No wheezing.  Abdominal:     General: Bowel sounds are normal. There is no distension.     Palpations: Abdomen is soft.     Tenderness: There is no abdominal tenderness.  Musculoskeletal:     Cervical back: Normal range of motion.     Right lower leg: No edema.     Left lower leg: No edema.  Lymphadenopathy:     Cervical: No cervical adenopathy.  Skin:    General: Skin is warm and dry.     Capillary Refill: Capillary refill takes less than 2 seconds.  Neurological:     General: No focal deficit present.     Mental Status: She is alert and oriented to person, place, and time.     Motor: Weakness present.     Gait: Gait abnormal.     Comments: walker  Psychiatric:        Mood and Affect: Mood normal.         Behavior: Behavior normal.    Labs reviewed: Basic Metabolic Panel: Recent Labs    10/17/19 0957 05/05/20 1439 05/10/20 0932 08/31/20 1106  NA 138 137 135 133*  K 4.7 5.5* 4.3 4.9  CL 100 99 99 101  CO2 29 20 26 21   GLUCOSE 95 87 91 124  BUN 27* 26 26* 31*  CREATININE 0.72 0.88 0.76 1.06*  CALCIUM 9.9 9.5 9.3 9.4  TSH 0.73  --   --  0.08*   Liver Function Tests: Recent Labs    10/17/19 0957 08/31/20 1106  AST 19 20  ALT 13 14  BILITOT 0.4 0.3  PROT 6.9 6.3   No results for input(s): LIPASE, AMYLASE in the last 8760 hours. No results for input(s): AMMONIA in the last 8760 hours. CBC: Recent Labs    10/17/19 0957 05/05/20 1439 08/31/20 1106  WBC 5.6 7.1 7.4  NEUTROABS 3,130  --  4,884  HGB 12.2 12.9 10.6*  HCT 36.0 38.0 31.4*  MCV 88.5 88 88.2  PLT 255 239 283   Lipid Panel: No results for input(s): CHOL, HDL, LDLCALC, TRIG, CHOLHDL, LDLDIRECT in the last 8760 hours. TSH: Recent Labs    10/17/19 0957 08/31/20 1106  TSH 0.73 0.08*   A1C: No results found for: HGBA1C   Assessment/Plan 1. Hypothyroidism due to Hashimoto's thyroiditis - TSH 0.08 08/31/2020 - asymptomatic -  levothyroxine (SYNTHROID) 112 MCG tablet; Take 1 tablet (112 mcg total) by mouth daily.  Dispense: 90 tablet; Refill: 3 - TSH- future  2. Essential hypertension - controlled  - cont amlodipine and benicar - cont to limit sodium in diet - bmp- future  3. Unsteady gait - doing well with PT, recommend special shoe d/t uneven leg lengths - no recent falls  - cont to ambulate with walker - referral to orthopedics for shoe  4. Bipolar 1 disorder, depressed ([place]) - stable with depakote - valproic level 32.6  5. Seasonal affective disorder ([place]) - denies depression - cont effexor  6. Arthralgia of both knees - doing well with PT - cont celebrex for pain - cont PT  7. Low hemoglobin - hgb 10.5 08/31/2020 - cbc/diff- future  8. Age related osteoporosis without current  pathological fracture - cont weekly fosamax - cont PT  Total time: 36 minutes. Greater than 50% of total time doing patient education on medication management and health promotion.    Next appt: 01/06/2021  Windell Moulding, [facility] [phone]

## 2020-09-03 ENCOUNTER — Ambulatory Visit: Payer: Medicare Other | Admitting: Physical Therapy

## 2020-09-03 ENCOUNTER — Ambulatory Visit (HOSPITAL_BASED_OUTPATIENT_CLINIC_OR_DEPARTMENT_OTHER): Payer: Medicare Other | Admitting: Physical Therapy

## 2020-09-03 ENCOUNTER — Encounter: Payer: Self-pay | Admitting: Physical Therapy

## 2020-09-03 DIAGNOSIS — M6281 Muscle weakness (generalized): Secondary | ICD-10-CM

## 2020-09-03 DIAGNOSIS — R2681 Unsteadiness on feet: Secondary | ICD-10-CM

## 2020-09-03 DIAGNOSIS — M25551 Pain in right hip: Secondary | ICD-10-CM

## 2020-09-03 DIAGNOSIS — M25552 Pain in left hip: Secondary | ICD-10-CM | POA: Diagnosis not present

## 2020-09-03 DIAGNOSIS — R262 Difficulty in walking, not elsewhere classified: Secondary | ICD-10-CM

## 2020-09-03 NOTE — Therapy (Signed)
[facility] [address], [place], [place], [address] Phone: [phone]   Fax:  [phone]  Physical Therapy Treatment  Patient Details  Name: Jackie Jones MRN: [id number] Date of Birth: April 20, 1945 Referring Provider (PT): Windell Moulding NP   Encounter Date: 09/03/2020   PT End of Session - 09/03/20 1545     Visit Number 15    Date for PT Re-Evaluation 09/16/20    Authorization Type Medicare- KX at 15    Progress Note Due on Visit 20    PT Start Time 1210    PT Stop Time 1250    PT Time Calculation (min) 40 min    Activity Tolerance Patient tolerated treatment well    Behavior During Therapy [facility] for tasks assessed/performed             Past Medical History:  Diagnosis Date   Allergy    Arthritis    Bipolar 1 disorder, depressed ([place])    Bipolar disorder ([place])    Chicken pox    Dementia ([place])    Depression    Hashimoto's thyroiditis    History of blood transfusion    History of bone density study 2019   History of colonoscopy 2015   History of CT scan 2019   History of mammogram 2019   History of MRI    History of Papanicolaou smear of cervix    Hx: UTI (urinary tract infection)    Hypertension    Hypothyroidism    Lactose intolerance    Legally blind in left eye, as defined in [place]    Non-celiac gluten sensitivity    Osteoporosis    Pernicious anemia    Pernicious anemia    Rheumatic fever    Thyroid disease    Urinary and fecal incontinence     Past Surgical History:  Procedure Laterality Date   [place] CATH AND CORONARY ANGIOGRAPHY N/A 05/10/2020   Procedure: LEFT HEART CATH AND CORONARY ANGIOGRAPHY;  Surgeon: Nelva Bush, MD;  Location: [facility];  Service: Cardiovascular;  Laterality: N/A;   prolapsed rectum 2015     TONSILLECTOMY AND ADENOIDECTOMY  1966   TUBAL LIGATION      There were no vitals filed  for this visit.   Subjective Assessment - 09/03/20 1544     Subjective I felt very good after the pool last session. My legs were a little tired but not bad.    Pertinent History goes by either "Jackie Jones" or "Jackie Jones";  heart arthymia; HTN; multi joint OA especially shoulders, knees and hips; scoliosis with signifcant leg length discrepancy; Bipolar 1    Currently in Pain? No/denies    Multiple Pain Sites No             Treatment: Aquatics: Patient seen for aquatic therapy today.  Treatment took place in water 3.5-4.5 feet deep depending upon activity.  Pt entered the pool via steps: step to step with heavy use of rails. Water temp 90 degrees F.  Seated water bench with 75% submersion Pt performed seated LE AROM exercises 20x in all planes with concurrent discussion of status and how she did with her initial aquatics session. Scap depression ( lift the buttocks off the bench) 10x, Multi colored UE weights horizontal shld add/abd 20x with VC to push against the water to tolerance. Kickboard submersions for core contractions 3 sec holds 10x. LE  bicycle 2 min.  Mid waist depth: Water walking with thick square noodle 6x forward and back: ( short length of pool). PTA SBA behind pt.  Pt with min-mod UE on side of pool for side stepping 4 lengths.  Wall exs: hip circumduction 10x each direction Bil with Bil UE on side of pool.                             PT Short Term Goals - 08/09/20 3419       PT SHORT TERM GOAL #1   Title The patient will be able to walk 240 feet with RW needed for improved mobility at her new assisted living residence    Time 6    Period Weeks    Status Achieved      PT SHORT TERM GOAL #2   Title The patient will have improved LE strength with the ability to rise from a standard chair with min UE assist    Time 6    Period Weeks    Status Achieved      PT SHORT TERM GOAL #3   Title TUG test improved to 25 sec indicating improved gait speed     Baseline 16.11 seconds    Time 6    Period Weeks    Status Achieved      PT SHORT TERM GOAL #4   Title BERG balance test improved to 40/56 indicating decreased fall risk    Period Weeks    Status Achieved   42/56              PT Long Term Goals - 09/03/20 1553       PT LONG TERM GOAL #1   Title The patient will be independent with safe self progression of HEP and initiation of group ex program in community    Period Weeks    Status On-going                   Plan - 09/03/20 1546     Clinical Impression Statement Pt arrives today for her second aquatics treatment. She reports tolerating the initial pool session very well. Today pt was able to demonstrate greater ease with all aquatic mobility, no LOB. Pt does need some back support ( thicker blue noodle works well) for any seated exercise in order for pt to stay balanced. No pain reported, pt increased her entire work load today even her forward walking pace. Pt is meeting with Ortho MD next week to get her shoe for her leg length discrepancy.    Personal Factors and Comorbidities Age;Comorbidity 1;Comorbidity 2;Comorbidity 3+;Transportation;Time since onset of injury/illness/exacerbation    Comorbidities scoliosis with leg length discrepancy; bil knee/hip OA; cardiac history; HTN; reports incontinence issues related to cervical issues but states she can make adjustments in order to do pool therapy;    Examination-Activity Limitations Locomotion Level;Transfers;Lift;Hygiene/Grooming;Stand;Stairs;Sleep    Examination-Participation Restrictions Cleaning;Community Activity;Shop;Meal Prep    Stability/Clinical Decision Making Evolving/Moderate complexity    Rehab Potential Good    PT Frequency 2x / week    PT Duration 12 weeks    PT Treatment/Interventions ADLs/Self Care Home Management;Aquatic Therapy;Electrical Stimulation;Cryotherapy;Moist Heat;Therapeutic activities;Therapeutic exercise;Neuromuscular re-education;Manual  techniques;Patient/family education;Taping    PT Next Visit Plan Continue with aquatics: balance, LE strength ( functional strength) and conditioning    PT Home Exercise Plan [id number]    Consulted and Agree with Plan of Care Patient  Patient will benefit from skilled therapeutic intervention in order to improve the following deficits and impairments:  Difficulty walking, Pain, Decreased balance, Decreased strength, Impaired perceived functional ability  Visit Diagnosis: Pain in left hip  Pain in right hip  Unsteady gait  Muscle weakness (generalized)  Difficulty in walking, not elsewhere classified     Problem List Patient Active Problem List   Diagnosis Date Noted   Palpitations 05/21/2020   Coronary artery disease involving native coronary artery of native heart without angina pectoris 05/21/2020   Near syncope 05/05/2020   Seasonal affective disorder ([place]) 04/22/2020   Bipolar 1 disorder, depressed ([place]) 03/01/2018   HTN (hypertension) 03/01/2018   Hypothyroid 03/01/2018   Pernicious anemia 03/01/2018   Osteoporosis 03/01/2018   Macular degeneration 03/01/2018    Jackie Jones, PTA 09/03/2020, 3:56 PM  [facility] [address], [place], [place], [address] Phone: [phone]   Fax:  [phone]  Name: Jackie Jones MRN: [id number] Date of Birth: 10/23/1945

## 2020-09-07 DIAGNOSIS — B351 Tinea unguium: Secondary | ICD-10-CM | POA: Diagnosis not present

## 2020-09-07 DIAGNOSIS — L603 Nail dystrophy: Secondary | ICD-10-CM | POA: Diagnosis not present

## 2020-09-07 DIAGNOSIS — I70213 Atherosclerosis of native arteries of extremities with intermittent claudication, bilateral legs: Secondary | ICD-10-CM | POA: Diagnosis not present

## 2020-09-09 ENCOUNTER — Ambulatory Visit (INDEPENDENT_AMBULATORY_CARE_PROVIDER_SITE_OTHER): Payer: Medicare Other

## 2020-09-09 ENCOUNTER — Encounter: Payer: Self-pay | Admitting: Orthopedic Surgery

## 2020-09-09 ENCOUNTER — Ambulatory Visit (INDEPENDENT_AMBULATORY_CARE_PROVIDER_SITE_OTHER): Payer: Medicare Other | Admitting: Orthopedic Surgery

## 2020-09-09 DIAGNOSIS — M2170 Unequal limb length (acquired), unspecified site: Secondary | ICD-10-CM

## 2020-09-09 DIAGNOSIS — M4306 Spondylolysis, lumbar region: Secondary | ICD-10-CM

## 2020-09-09 DIAGNOSIS — M87051 Idiopathic aseptic necrosis of right femur: Secondary | ICD-10-CM | POA: Diagnosis not present

## 2020-09-09 DIAGNOSIS — I200 Unstable angina: Secondary | ICD-10-CM

## 2020-09-09 NOTE — Progress Notes (Signed)
Office Visit Note   Patient: Jackie Jones           Date of Birth: [birth year]           MRN: [id number] Visit Date: 09/09/2020              Requested by: Yvonna Alanis, NP [phone],  [place] [address] PCP: Yvonna Alanis, NP  Chief Complaint  Patient presents with   Right Leg - Pain   Lower Back - Pain      HPI: Patient is a 75 year old woman who presents with complaints of leg length inequality secondary to scoliosis.  Patient states she does have lower back pain and chronic hip pain worse on the right than the left.  She states that her right leg is about 2 inches shorter.  Assessment & Plan: Visit Diagnoses:  1. Leg length discrepancy   2. Pars defect of lumbar spine   3. Avascular necrosis of bone of hip, right ([place])     Plan: We will have patient see Dr. Ninfa Linden for evaluation of her right total hip arthroplasty.  Discussed that surgical fusion of her lower lumbar spine is an option.  Patient states she does not want to consider back surgery due to a friend being paralyzed from their back surgery.  Follow-Up Instructions: No follow-ups on file.   Ortho Exam  Patient is alert, oriented, no adenopathy, well-dressed, normal affect, normal respiratory effort. Examination patient ambulates with a kyphosis and leg length inequality on the right using a rolling walker.  On examination she has no internal or external rotation of the right hip clinically she has about 2 inches of shortening of the right lower extremity there is no pelvic obliquity with her standing.  Her leg length inequality seems to be coming from the collapse of the right hip.  She does not have a straight leg raise on the right no focal motor weakness.  She does have slight ulnar deviation at the MCP joints bilaterally.  She states that she is occasionally taken prednisone for poison ivy.  Denies a history of rheumatoid arthritis.  Imaging: XR Lumbar Spine 2-3 Views  Result Date:  09/09/2020 2 view radiographs of the lumbar spine shows advanced disc space collapse with no scoliosis deformity.  Patient does have a pars defect with a spondylolisthesis at L4-5 with complete disc space collapse at L3-4-5 and S1.  XR Pelvis 1-2 Views  Result Date: 09/09/2020 AP of the pelvis shows avascular necrosis collapse of the femoral head on the right with approximately 1-1/2 cm of shortening.    Labs: No results found for: HGBA1C, ESRSEDRATE, CRP, LABURIC, REPTSTATUS, GRAMSTAIN, CULT, LABORGA   Lab Results  Component Value Date   ALBUMIN 4.1 09/25/2018    No results found for: MG Lab Results  Component Value Date   VD25OH 49 10/17/2019    No results found for: PREALBUMIN CBC EXTENDED Latest Ref Rng & Units 08/31/2020 05/05/2020 10/17/2019  WBC 3.8 - 10.8 Thousand/uL 7.4 7.1 5.6  RBC 3.80 - 5.10 Million/uL 3.56(L) 4.31 4.07  HGB 11.7 - 15.5 g/dL 10.6(L) 12.9 12.2  HCT 35.0 - 45.0 % 31.4(L) 38.0 36.0  PLT 140 - 400 Thousand/uL 283 239 255  NEUTROABS 1,500 - 7,800 cells/uL 4,884 - 3,130  LYMPHSABS 850 - 3,900 cells/uL 1,613 - 1,674     There is no height or weight on file to calculate BMI.  Orders:  Orders Placed This Encounter  Procedures  XR Pelvis 1-2 Views   XR Lumbar Spine 2-3 Views   No orders of the defined types were placed in this encounter.    Procedures: No procedures performed  Clinical Data: No additional findings.  ROS:  All other systems negative, except as noted in the HPI. Review of Systems  Objective: Vital Signs: There were no vitals taken for this visit.  Specialty Comments:  No specialty comments available.  PMFS History: Patient Active Problem List   Diagnosis Date Noted   Palpitations 05/21/2020   Coronary artery disease involving native coronary artery of native heart without angina pectoris 05/21/2020   Near syncope 05/05/2020   Seasonal affective disorder ([place]) 04/22/2020   Bipolar 1 disorder, depressed ([place])  03/01/2018   HTN (hypertension) 03/01/2018   Hypothyroid 03/01/2018   Pernicious anemia 03/01/2018   Osteoporosis 03/01/2018   Macular degeneration 03/01/2018   Past Medical History:  Diagnosis Date   Allergy    Arthritis    Bipolar 1 disorder, depressed ([place])    Bipolar disorder ([place])    Chicken pox    Dementia ([place])    Depression    Hashimoto's thyroiditis    History of blood transfusion    History of bone density study 2019   History of colonoscopy 2015   History of CT scan 2019   History of mammogram 2019   History of MRI    History of Papanicolaou smear of cervix    Hx: UTI (urinary tract infection)    Hypertension    Hypothyroidism    Lactose intolerance    Legally blind in left eye, as defined in [place]    Non-celiac gluten sensitivity    Osteoporosis    Pernicious anemia    Pernicious anemia    Rheumatic fever    Thyroid disease    Urinary and fecal incontinence     Family History  Problem Relation Age of Onset   Heart disease Mother    Arthritis Mother    Cancer Father        bladder   Thyroid disease Father    Dementia Father    Hypertension Sister    Hypertension Brother    Hypertension Sister    Hypertension Sister    Diabetes Brother    Diabetes type II Son     Past Surgical History:  Procedure Laterality Date   ABDOMINAL HYSTERECTOMY     1981   APPENDECTOMY  1981   LEFT HEART CATH AND CORONARY ANGIOGRAPHY N/A 05/10/2020   Procedure: LEFT HEART CATH AND CORONARY ANGIOGRAPHY;  Surgeon: Nelva Bush, MD;  Location: [facility];  Service: Cardiovascular;  Laterality: N/A;   prolapsed rectum 2015     TONSILLECTOMY AND ADENOIDECTOMY  1966   TUBAL LIGATION     Social History   Occupational History   Not on file  Tobacco Use   Smoking status: Never   Smokeless tobacco: Never  Substance and Sexual Activity   Alcohol use: Never   Drug use: Never   Sexual activity: Not Currently

## 2020-09-10 ENCOUNTER — Ambulatory Visit: Payer: Medicare Other | Admitting: Physical Therapy

## 2020-09-10 ENCOUNTER — Encounter (HOSPITAL_BASED_OUTPATIENT_CLINIC_OR_DEPARTMENT_OTHER): Payer: Self-pay | Admitting: Physical Therapy

## 2020-09-10 ENCOUNTER — Other Ambulatory Visit: Payer: Self-pay

## 2020-09-10 ENCOUNTER — Ambulatory Visit (HOSPITAL_BASED_OUTPATIENT_CLINIC_OR_DEPARTMENT_OTHER): Payer: Medicare Other | Attending: Orthopedic Surgery | Admitting: Physical Therapy

## 2020-09-10 DIAGNOSIS — R262 Difficulty in walking, not elsewhere classified: Secondary | ICD-10-CM | POA: Diagnosis not present

## 2020-09-10 DIAGNOSIS — M6281 Muscle weakness (generalized): Secondary | ICD-10-CM

## 2020-09-10 DIAGNOSIS — M25552 Pain in left hip: Secondary | ICD-10-CM

## 2020-09-10 DIAGNOSIS — M25551 Pain in right hip: Secondary | ICD-10-CM | POA: Diagnosis not present

## 2020-09-10 DIAGNOSIS — R2681 Unsteadiness on feet: Secondary | ICD-10-CM | POA: Insufficient documentation

## 2020-09-10 NOTE — Therapy (Addendum)
[facility] [address] [place], [place], [address] Phone: [phone]   Fax:  [phone]  Physical Therapy Treatment  Patient Details  Name: Jackie Jones MRN: [id number] Date of Birth: 28-May-1945 Referring Provider (PT): Windell Moulding NP   Encounter Date: 09/10/2020   Subjective Went to see Dr Sharol Given. He said i need to have a THR. wanted me to see Dr Ninfa Linden the next day but I put it off until next Wed.  Past Medical History:  Diagnosis Date   Allergy    Arthritis    Bipolar 1 disorder, depressed ([place])    Bipolar disorder ([place])    Chicken pox    Dementia ([place])    Depression    Hashimoto's thyroiditis    History of blood transfusion    History of bone density study 2019   History of colonoscopy 2015   History of CT scan 2019   History of mammogram 2019   History of MRI    History of Papanicolaou smear of cervix    Hx: UTI (urinary tract infection)    Hypertension    Hypothyroidism    Lactose intolerance    Legally blind in left eye, as defined in [place]    Non-celiac gluten sensitivity    Osteoporosis    Pernicious anemia    Pernicious anemia    Rheumatic fever    Thyroid disease    Urinary and fecal incontinence     Past Surgical History:  Procedure Laterality Date   [place]   LEFT HEART CATH AND CORONARY ANGIOGRAPHY N/A 05/10/2020   Procedure: LEFT HEART CATH AND CORONARY ANGIOGRAPHY;  Surgeon: Nelva Bush, MD;  Location: [facility];  Service: Cardiovascular;  Laterality: N/A;   prolapsed rectum 2015     TONSILLECTOMY AND ADENOIDECTOMY  1966   TUBAL LIGATION      There were no vitals filed for this visit.   Treatment: Aquatics: Patient seen for aquatic therapy today.  Treatment took place in water 3.5-4.5 feet deep depending upon activity.  Pt entered the pool via steps: step to step with heavy use of rails and Cga. Water temp 90 degrees  F.   Seated water bench with 75% submersion Pt performed seated LE AROM exercises 20x in all planes. Scap depression ( lift the buttocks off the bench).  LE stretching add/abd, gastroc.   Supine suspension Kicking 4 trials of 2 mins using squoodle and therapist for support  Vertical suspension Core strengthening challenges to balance vertically in 4.5 ft supported by foam   Mid waist depth/Standing Water walking with thick square noodle 6x forward and back using only 2 finger support: ( short length of pool). Therapist SBA behind pt.  Sidestepping x 2 lengths  Wall exs: hip circumduction 10x each direction Bil with Bil UE on side of pool.                              PT Short Term Goals - 08/09/20 0923       PT SHORT TERM GOAL #1   Title The patient will be able to walk 240 feet with RW needed for improved mobility at her new assisted living residence    Time 6    Period Weeks    Status Achieved      PT SHORT TERM GOAL #2   Title The patient will  have improved LE strength with the ability to rise from a standard chair with min UE assist    Time 6    Period Weeks    Status Achieved      PT SHORT TERM GOAL #3   Title TUG test improved to 25 sec indicating improved gait speed    Baseline 16.11 seconds    Time 6    Period Weeks    Status Achieved      PT SHORT TERM GOAL #4   Title BERG balance test improved to 40/56 indicating decreased fall risk    Period Weeks    Status Achieved   42/56              PT Long Term Goals - 09/14/20 3810       PT LONG TERM GOAL #1   Title The patient will be independent with safe self progression of HEP and initiation of group ex program in community    Time 8    Period Weeks    Status On-going    Target Date 11/09/20      PT LONG TERM GOAL #2   Title The patient will be able to complete a 6 minute walk test and/or 1050 feet with RW    Baseline 953 feet today    Time 8    Period Weeks    Status  Revised    Target Date 11/09/20      PT LONG TERM GOAL #3   Title The patient will have LE strength to grossly 4/5 needed to rise from a standard chair 1x without UE assist    Status Achieved      PT LONG TERM GOAL #4   Title BERG balance score improved to 43/56 indicating decreased risk of falls    Time 8    Period Weeks    Status On-going    Target Date 11/09/20      PT LONG TERM GOAL #5   Title Timed up and Go score improved to 22 sec or less    Baseline 12.82    Status Achieved      PT LONG TERM GOAL #6   Title 5x sit to stand with light UE assist in 15 sec or less    Baseline 20.44    Status Revised           Clinical Impression Pt present today with questions from MD appoint. She is educated on new diagnosis and usual remedies for. Pt wil likely undergo a Right THR due to avascular necrosis soon. She will call us and cancel any appointments as needed. Focus of treatment today after extensive edu is stretchinging/ROM LE's and shoulder. Core strengthening/balance reraining added in 4.5 ft.         Patient will benefit from skilled therapeutic intervention in order to improve the following deficits and impairments:  Difficulty walking, Pain, Decreased balance, Decreased strength, Impaired perceived functional ability  Visit Diagnosis: Pain in left hip  Pain in right hip  Unsteady gait  Muscle weakness (generalized)  Difficulty in walking, not elsewhere classified     Problem List Patient Active Problem List   Diagnosis Date Noted   Palpitations 05/21/2020   Coronary artery disease involving native coronary artery of native heart without angina pectoris 05/21/2020   Near syncope 05/05/2020   Seasonal affective disorder ([place]) 04/22/2020   Bipolar 1 disorder, depressed ([place]) 03/01/2018   HTN (hypertension) 03/01/2018   Hypothyroid 03/01/2018   Pernicious anemia 03/01/2018  Osteoporosis 03/01/2018   Macular degeneration 03/01/2018    Vedia Pereyra   MPT 09/17/2020, 2:49 PM  [facility] [place], [place], [address] Phone: [phone]   Fax:  [phone]  Name: Abilene Mcphee MRN: [id number] Date of Birth: 1945/06/20  Addended by: Subhan Hoopes Tharon Aquas) [place] MPT

## 2020-09-14 ENCOUNTER — Ambulatory Visit: Payer: Medicare Other

## 2020-09-14 ENCOUNTER — Other Ambulatory Visit: Payer: Self-pay

## 2020-09-14 DIAGNOSIS — M25552 Pain in left hip: Secondary | ICD-10-CM | POA: Diagnosis not present

## 2020-09-14 DIAGNOSIS — M25551 Pain in right hip: Secondary | ICD-10-CM | POA: Diagnosis not present

## 2020-09-14 DIAGNOSIS — R262 Difficulty in walking, not elsewhere classified: Secondary | ICD-10-CM | POA: Diagnosis not present

## 2020-09-14 DIAGNOSIS — M6281 Muscle weakness (generalized): Secondary | ICD-10-CM

## 2020-09-14 DIAGNOSIS — R2681 Unsteadiness on feet: Secondary | ICD-10-CM

## 2020-09-14 NOTE — Therapy (Signed)
[facility] [address], [place], [place], [address] Phone: [phone]   Fax:  [phone]  Physical Therapy Treatment  Patient Details  Name: Jackie Jones MRN: [id number] Date of Birth: 18-Mar-1946 Referring Provider (PT): Windell Moulding NP   Encounter Date: 09/14/2020   PT End of Session - 09/14/20 1016     Visit Number 17    Date for PT Re-Evaluation 11/09/20    Authorization Type Medicare- KX at 15    Progress Note Due on Visit 36    PT Start Time 0931    PT Stop Time 4665    PT Time Calculation (min) 43 min    Activity Tolerance Patient tolerated treatment well    Behavior During Therapy [facility] for tasks assessed/performed             Past Medical History:  Diagnosis Date   Allergy    Arthritis    Bipolar 1 disorder, depressed ([place])    Bipolar disorder ([place])    Chicken pox    Dementia ([place])    Depression    Hashimoto's thyroiditis    History of blood transfusion    History of bone density study 2019   History of colonoscopy 2015   History of CT scan 2019   History of mammogram 2019   History of MRI    History of Papanicolaou smear of cervix    Hx: UTI (urinary tract infection)    Hypertension    Hypothyroidism    Lactose intolerance    Legally blind in left eye, as defined in [place]    Non-celiac gluten sensitivity    Osteoporosis    Pernicious anemia    Pernicious anemia    Rheumatic fever    Thyroid disease    Urinary and fecal incontinence     Past Surgical History:  Procedure Laterality Date   [place] CATH AND CORONARY ANGIOGRAPHY N/A 05/10/2020   Procedure: LEFT HEART CATH AND CORONARY ANGIOGRAPHY;  Surgeon: Nelva Bush, MD;  Location: [facility];  Service: Cardiovascular;  Laterality: N/A;   prolapsed rectum 2015     TONSILLECTOMY AND ADENOIDECTOMY  1966   TUBAL LIGATION      There were no vitals filed  for this visit.   Subjective Assessment - 09/14/20 0932     Subjective I have been doing really well with aquatics.  I saw Dr Sharol Given and I have a collapse of Rt femoral head due to AVN.  Pt will see Dr Ninfa Linden tomorrow.    Pertinent History goes by either "[name]" or "[name]";  heart arthymia; HTN; multi joint OA especially shoulders, knees and hips; scoliosis with signifcant leg length discrepancy; Bipolar 1    Patient Stated Goals I'd like to walk again (eventually walked 4 miles); get strength in legs for steps (my sister had good PT and taught me to do 1 inch step ups on book.)  Move around safely    Currently in Pain? No/denies                [facility] PT Assessment - 09/14/20 0001       Assessment   Medical Diagnosis unsteady gait    Referring Provider (PT) Windell Moulding NP    Next MD Visit Dr Ninfa Linden 09/15/20 for Rt hip      Precautions   Precautions Fall      Balance Screen  Has the patient fallen in the past 6 months No    Has the patient had a decrease in activity level because of a fear of falling?  No    Is the patient reluctant to leave their home because of a fear of falling?  No      Home Environment   Living Environment Assisted living    [place] - 2 wheels;[facility] - single point      Prior Function   Level of Independence Independent with household mobility with device    Vocation Retired    Leisure NuStep daily, walking for exercise      Cognition   Overall Cognitive Status Within Functional Limits for tasks assessed      Posture/Postural Control   Posture/Postural Control Postural limitations    Posture Comments knee valgus; right LE significantly shorter in stance;  audible crepitus in both hips with every step and with sit to stand      Strength   Overall Strength Comments LEs grossly 4-/5; needs min assist of UEs with sit to stand; UEs grossly 3+ to 4-/5      Transfers   Transfers Sit to Stand;Stand to Sit    Sit to Stand 6: Modified  independent (Device/Increase time);With upper extremity assist    Five time sit to stand comments  20.44- no hands from standard height chair.      Ambulation/Gait   Ambulation/Gait Yes    Ambulation/Gait Assistance 6: Modified independent (Device/Increase time)    Gait Pattern Step-through pattern;Decreased step length - right;Decreased stance time - right;Antalgic;Lateral hip instability;Decreased trunk rotation;Trunk flexed;Left flexed knee in stance;Right flexed knee in stance      6 minute walk test results    Endurance additional comments 6 min walk- 935 feet with walker      Timed Up and Go Test   Normal TUG (seconds) 12.82    TUG Comments using walker                           [facility] Adult PT Treatment/Exercise - 09/14/20 0001       Knee/Hip Exercises: Aerobic   Nustep L5 x10- PT present to discuss progress      Knee/Hip Exercises: Seated   Long Arc Quad Strengthening;Both;1 set;10 reps;Weights    Long Arc Quad Weight 2 lbs.    Marching Strengthening;Both;20 reps;3 sets   tap edge of treadmill   Marching Limitations 2#                      PT Short Term Goals - 08/09/20 5361       PT SHORT TERM GOAL #1   Title The patient will be able to walk 240 feet with RW needed for improved mobility at her new assisted living residence    Time 6    Period Weeks    Status Achieved      PT SHORT TERM GOAL #2   Title The patient will have improved LE strength with the ability to rise from a standard chair with min UE assist    Time 6    Period Weeks    Status Achieved      PT SHORT TERM GOAL #3   Title TUG test improved to 25 sec indicating improved gait speed    Baseline 16.11 seconds    Time 6    Period Weeks    Status Achieved  PT SHORT TERM GOAL #4   Title BERG balance test improved to 40/56 indicating decreased fall risk    Period Weeks    Status Achieved   42/56              PT Long Term Goals - 09/14/20 0939       PT  LONG TERM GOAL #1   Title The patient will be independent with safe self progression of HEP and initiation of group ex program in community    Time 8    Period Weeks    Status On-going    Target Date 11/09/20      PT LONG TERM GOAL #2   Title The patient will be able to complete a 6 minute walk test and/or 1050 feet with RW    Baseline 953 feet today    Time 8    Period Weeks    Status Revised    Target Date 11/09/20      PT LONG TERM GOAL #3   Title The patient will have LE strength to grossly 4/5 needed to rise from a standard chair 1x without UE assist    Status Achieved      PT LONG TERM GOAL #4   Title BERG balance score improved to 43/56 indicating decreased risk of falls    Time 8    Period Weeks    Status On-going    Target Date 11/09/20      PT LONG TERM GOAL #5   Title Timed up and Go score improved to 22 sec or less    Baseline 12.82    Status Achieved      PT LONG TERM GOAL #6   Title 5x sit to stand with light UE assist in 15 sec or less    Baseline 20.44    Status Revised                   Plan - 09/14/20 1008     Clinical Impression Statement Pt is making steady gains with PT.  Pt is going to aquatic PT now and is doing well with this.  Pt continues to do the NuStep at home daily, walks for exercises and does HEP for strength and endurance gains.  Pt is able to perform sit to stand from a standard height chair without UE support and 5x sit to stand is 20.44 seconds.  This indicates improved functional strength and balance overall. TUG is improved to 12.82 indicating reduced falls risk.    Hip strength is improved although continued weakness.  Pt saw MD last week and was diagnosed with Rt hip AVN and will see Dr Ninfa Linden tomorrow to discuss options.  Pt covered 953 ft in 6 minutes with rolling walker with good pace and was able to hold a conversation while doing this.  Pt with antalgic gait and compensatory mechanics with transitional movements due to  functional strength deficits and leg length discrepancy.  Pt will continue to benefit from skilled PT to address strength, balance and mobility for safety and independence at home and in the community.    PT Frequency 2x / week    PT Duration 8 weeks    PT Treatment/Interventions ADLs/Self Care Home Management;Aquatic Therapy;Electrical Stimulation;Cryotherapy;Moist Heat;Therapeutic activities;Therapeutic exercise;Neuromuscular re-education;Manual techniques;Patient/family education;Taping    PT Next Visit Plan continue with aquatic PT and pt will work on [place] at home and follow up with PT at [facility] for reassessment as needed.  Pt will  see Dr Ninfa Linden tomorrow    PT Home Exercise Plan [id number]    Recommended Other Services recert sent 04/26/99    Consulted and Agree with Plan of Care Patient             Patient will benefit from skilled therapeutic intervention in order to improve the following deficits and impairments:  Difficulty walking, Pain, Decreased balance, Decreased strength, Impaired perceived functional ability  Visit Diagnosis: Pain in left hip - Plan: PT plan of care cert/re-cert  Pain in right hip - Plan: PT plan of care cert/re-cert  Unsteady gait - Plan: PT plan of care cert/re-cert  Muscle weakness (generalized) - Plan: PT plan of care cert/re-cert  Difficulty in walking, not elsewhere classified - Plan: PT plan of care cert/re-cert     Problem List Patient Active Problem List   Diagnosis Date Noted   Palpitations 05/21/2020   Coronary artery disease involving native coronary artery of native heart without angina pectoris 05/21/2020   Near syncope 05/05/2020   Seasonal affective disorder ([place]) 04/22/2020   Bipolar 1 disorder, depressed ([place]) 03/01/2018   HTN (hypertension) 03/01/2018   Hypothyroid 03/01/2018   Pernicious anemia 03/01/2018   Osteoporosis 03/01/2018   Macular degeneration 03/01/2018    Sigurd Sos, PT 09/14/20 10:17 AM   [facility] [address] [place], [place], [address] Phone: [phone]   Fax:  [phone]  Name: Karin Pinedo MRN: [id number] Date of Birth: Sep 29, 1945

## 2020-09-15 ENCOUNTER — Ambulatory Visit (INDEPENDENT_AMBULATORY_CARE_PROVIDER_SITE_OTHER): Payer: Medicare Other | Admitting: Orthopaedic Surgery

## 2020-09-15 VITALS — Ht 65.0 in | Wt 135.0 lb

## 2020-09-15 DIAGNOSIS — M2170 Unequal limb length (acquired), unspecified site: Secondary | ICD-10-CM

## 2020-09-15 DIAGNOSIS — I200 Unstable angina: Secondary | ICD-10-CM

## 2020-09-15 DIAGNOSIS — M87051 Idiopathic aseptic necrosis of right femur: Secondary | ICD-10-CM | POA: Diagnosis not present

## 2020-09-15 DIAGNOSIS — M1611 Unilateral primary osteoarthritis, right hip: Secondary | ICD-10-CM

## 2020-09-15 NOTE — Progress Notes (Signed)
Office Visit Note   Patient: Jackie Jones           Date of Birth: Aug 26, 1945           MRN: [id number] Visit Date: 09/15/2020              Requested by: Yvonna Alanis, NP [phone] [place],  [place] [address] PCP: Yvonna Alanis, NP   Assessment & Plan: Visit Diagnoses:  1. Leg length discrepancy   2. Primary osteoarthritis of right hip   3. Avascular necrosis of bone of hip, right (HCC)     Plan: Given patient's severe pain which affects her activities of daily living, radiographic findings along with clinical finding recommend right total hip arthroplasty.  Risk benefits of surgery discussed with patient at length.  Risk include but are not limited to DVT/PE, nerve vessel injury, wound healing problems, infection, leg length discrepancy and blood loss.  Perioperative postoperative protocol discussed with patient at length.  Questions were encouraged and answered by Dr. Ninfa Linden and myself.  Handout on hip replacement was given.  Hip replacement components were shown to the patient.  We will work on scheduling right total hip arthroplasty in the near future  Follow-Up Instructions: No follow-ups on file.   Orders:  No orders of the defined types were placed in this encounter.  No orders of the defined types were placed in this encounter.     Procedures: No procedures performed   Clinical Data: No additional findings.   Subjective: Chief Complaint  Patient presents with   Right Hip - Pain    HPI Jackie Jones is a 75 year old female who is seen at the request of Dr. Sharol Given for avascular necrosis right hip.  Patient states she has had hip pain for over a year.  The pain is increased dramatically since a fall last November.  She has pain in the right hip.  Has a significant leg length discrepancy.  She is been taking Celebrex Tylenol for the pain which helps some.  Patient does live in independent living.  She presents today with her sister to discuss possible hip  surgery. Radiographs AP pelvis lateral view of the right hip dated 09/09/2020 are reviewed with the patient.  Left hip well-preserved.  Right hip shows degenerative changes of the right hip along with avascular changes resulting in collapse of the femoral head.  No acute fractures noted.  Hips well located.  Review of Systems No fevers chills shortness of breath chest pain.  Objective: Vital Signs: Ht 5\' 5"  (1.651 m)   Wt 135 lb (61.2 kg)   BMI 22.47 kg/m   Physical Exam Constitutional:      Appearance: She is not ill-appearing or diaphoretic.  Pulmonary:     Effort: Pulmonary effort is normal.  Neurological:     Mental Status: She is alert and oriented to person, place, and time.  Psychiatric:        Mood and Affect: Mood normal.    Ortho Exam Left hip good range of motion without pain.  Right hip discomfort with internal and external rotation which are severely limited.  She has a significant leg length discrepancy with the left leg being longer despite the left knee with a valgus deformity. Specialty Comments:  No specialty comments available.  Imaging: No results found.   PMFS History: Patient Active Problem List   Diagnosis Date Noted   Palpitations 05/21/2020   Coronary artery disease involving native coronary artery of  native heart without angina pectoris 05/21/2020   Near syncope 05/05/2020   Seasonal affective disorder ([place]) 04/22/2020   Bipolar 1 disorder, depressed ([place]) 03/01/2018   HTN (hypertension) 03/01/2018   Hypothyroid 03/01/2018   Pernicious anemia 03/01/2018   Osteoporosis 03/01/2018   Macular degeneration 03/01/2018   Past Medical History:  Diagnosis Date   Allergy    Arthritis    Bipolar 1 disorder, depressed ([place])    Bipolar disorder ([place])    Chicken pox    Dementia ([place])    Depression    Hashimoto's thyroiditis    History of blood transfusion    History of bone density study 2019   History of colonoscopy 2015   History of CT scan 2019    History of mammogram 2019   History of MRI    History of Papanicolaou smear of cervix    Hx: UTI (urinary tract infection)    Hypertension    Hypothyroidism    Lactose intolerance    Legally blind in left eye, as defined in [place]    Non-celiac gluten sensitivity    Osteoporosis    Pernicious anemia    Pernicious anemia    Rheumatic fever    Thyroid disease    Urinary and fecal incontinence     Family History  Problem Relation Age of Onset   Heart disease Mother    Arthritis Mother    Cancer Father        bladder   Thyroid disease Father    Dementia Father    Hypertension Sister    Hypertension Brother    Hypertension Sister    Hypertension Sister    Diabetes Brother    Diabetes type II Son     Past Surgical History:  Procedure Laterality Date   ABDOMINAL HYSTERECTOMY     1981   APPENDECTOMY  1981   LEFT HEART CATH AND CORONARY ANGIOGRAPHY N/A 05/10/2020   Procedure: LEFT HEART CATH AND CORONARY ANGIOGRAPHY;  Surgeon: Nelva Bush, MD;  Location: [facility];  Service: Cardiovascular;  Laterality: N/A;   prolapsed rectum 2015     TONSILLECTOMY AND ADENOIDECTOMY  1966   TUBAL LIGATION     Social History   Occupational History   Not on file  Tobacco Use   Smoking status: Never   Smokeless tobacco: Never  Substance and Sexual Activity   Alcohol use: Never   Drug use: Never   Sexual activity: Not Currently

## 2020-09-16 ENCOUNTER — Ambulatory Visit: Payer: Medicare Other | Admitting: Psychology

## 2020-09-16 DIAGNOSIS — T781XXD Other adverse food reactions, not elsewhere classified, subsequent encounter: Secondary | ICD-10-CM | POA: Diagnosis not present

## 2020-09-16 DIAGNOSIS — J309 Allergic rhinitis, unspecified: Secondary | ICD-10-CM | POA: Diagnosis not present

## 2020-09-17 ENCOUNTER — Other Ambulatory Visit: Payer: Self-pay

## 2020-09-17 ENCOUNTER — Encounter (HOSPITAL_BASED_OUTPATIENT_CLINIC_OR_DEPARTMENT_OTHER): Payer: Self-pay | Admitting: Physical Therapy

## 2020-09-17 ENCOUNTER — Ambulatory Visit (HOSPITAL_BASED_OUTPATIENT_CLINIC_OR_DEPARTMENT_OTHER): Payer: Medicare Other | Attending: Orthopedic Surgery | Admitting: Physical Therapy

## 2020-09-17 DIAGNOSIS — R2681 Unsteadiness on feet: Secondary | ICD-10-CM | POA: Diagnosis not present

## 2020-09-17 DIAGNOSIS — R262 Difficulty in walking, not elsewhere classified: Secondary | ICD-10-CM | POA: Insufficient documentation

## 2020-09-17 DIAGNOSIS — M25552 Pain in left hip: Secondary | ICD-10-CM | POA: Diagnosis not present

## 2020-09-17 DIAGNOSIS — M25551 Pain in right hip: Secondary | ICD-10-CM | POA: Diagnosis not present

## 2020-09-17 DIAGNOSIS — M6281 Muscle weakness (generalized): Secondary | ICD-10-CM | POA: Diagnosis not present

## 2020-09-17 DIAGNOSIS — F3131 Bipolar disorder, current episode depressed, mild: Secondary | ICD-10-CM | POA: Diagnosis not present

## 2020-09-17 NOTE — Therapy (Signed)
Jackie Jones [address] [place], [place], [address] Phone: [phone]   Fax:  [phone]  Physical Therapy Treatment  Patient Details  Name: Jackie Jones MRN: [id number] Date of Birth: 16-Nov-1945 Referring Provider (PT): Jackie Moulding NP   Encounter Date: 09/17/2020   PT End of Session - 09/17/20 1428     Visit Number 18    Date for PT Re-Evaluation 11/09/20    Authorization Type Medicare- KX at 54    PT Start Time 1201    PT Stop Time 9528    PT Time Calculation (min) 44 min    Equipment Utilized During Treatment Other (comment)    Activity Tolerance Patient tolerated treatment well    Behavior During Therapy WFL for tasks assessed/performed             Past Medical History:  Diagnosis Date   Allergy    Arthritis    Bipolar 1 disorder, depressed ([place])    Bipolar disorder ([place])    Chicken pox    Dementia ([place])    Depression    Hashimoto's thyroiditis    History of blood transfusion    History of bone density study 2019   History of colonoscopy 2015   History of CT scan 2019   History of mammogram 2019   History of MRI    History of Papanicolaou smear of cervix    Hx: UTI (urinary tract infection)    Hypertension    Hypothyroidism    Lactose intolerance    Legally blind in left eye, as defined in [place]    Non-celiac gluten sensitivity    Osteoporosis    Pernicious anemia    Pernicious anemia    Rheumatic fever    Thyroid disease    Urinary and fecal incontinence     Past Surgical History:  Procedure Laterality Date   [place] CATH AND CORONARY ANGIOGRAPHY N/A 05/10/2020   Procedure: LEFT HEART CATH AND CORONARY ANGIOGRAPHY;  Surgeon: Nelva Bush, MD;  Location: [facility];  Service: Cardiovascular;  Laterality: N/A;   prolapsed rectum 2015     TONSILLECTOMY AND ADENOIDECTOMY  1966   TUBAL LIGATION      There were no  vitals filed for this visit.   Subjective Assessment - 09/17/20 1425     Subjective Saw Dr Jackie Jones. They will be calling me and scheduling the hip surgery.  He said to continue with the water therpy until then    Pertinent History goes by either "Jackie Jones" or "Jackie Jones";  heart arthymia; HTN; multi joint OA especially shoulders, knees and hips; scoliosis with signifcant leg length discrepancy; Bipolar 1    Limitations Walking;House hold activities;Standing    Currently in Pain? Yes    Pain Score 4     Pain Location Shoulder    Pain Orientation Left    Pain Descriptors / Indicators Sore    Pain Onset More than a month ago    Pain Frequency Intermittent    Multiple Pain Sites Yes    Pain Score 5    Pain Location Hip    Pain Orientation Right    Pain Descriptors / Indicators Aching    Pain Onset More than a month ago    Pain Frequency Intermittent    Aggravating Factors  walking    Pain Relieving Factors resting    Effect of Pain on Daily Activities difficulty  with amb and safe ADL's.            Treatment: Aquatics: Patient seen for aquatic therapy today.  Treatment took place in water 3.5-4.5 feet deep depending upon activity.  Pt entered the pool via steps: step to step with heavy use of rails and Cga. Water temp 90 degrees F.   Seated water bench with 75% submersion Pt performed seated LE AROM exercises 20x in all planes.  LE stretching add/abd, gastroc.   Supine suspension Kicking 2 trial of 2 mins using squoodle and therapist for support   Vertical suspension Core strengthening knees to chest and jack knife therapist stabilizing 2 x 5 each.  Cuing for min pain or pain free range.   Mid waist depth/Standing Water walking with thick square noodle 6 widths of pool forward and back using only 2 finger support. Therapist SBA.  Sidestepping x 2 lengths.  Cueing for increased step length and heel toe/toe heel   Wall exs: hip circumduction 10x each direction Bil with Bil UE on side of  pool.                              PT Short Term Goals - 08/09/20 5638       PT SHORT TERM GOAL #1   Title The patient will be able to walk 240 feet with RW needed for improved mobility at her new assisted living residence    Time 6    Period Weeks    Status Achieved      PT SHORT TERM GOAL #2   Title The patient will have improved LE strength with the ability to rise from a standard chair with min UE assist    Time 6    Period Weeks    Status Achieved      PT SHORT TERM GOAL #3   Title TUG test improved to 25 sec indicating improved gait speed    Baseline 16.11 seconds    Time 6    Period Weeks    Status Achieved      PT SHORT TERM GOAL #4   Title BERG balance test improved to 40/56 indicating decreased fall risk    Period Weeks    Status Achieved   42/56              PT Long Term Goals - 09/14/20 7564       PT LONG TERM GOAL #1   Title The patient will be independent with safe self progression of HEP and initiation of group ex program in community    Time 8    Period Weeks    Status On-going    Target Date 11/09/20      PT LONG TERM GOAL #2   Title The patient will be able to complete a 6 minute walk test and/or 1050 feet with RW    Baseline 953 feet today    Time 8    Period Weeks    Status Revised    Target Date 11/09/20      PT LONG TERM GOAL #3   Title The patient will have LE strength to grossly 4/5 needed to rise from a standard chair 1x without UE assist    Status Achieved      PT LONG TERM GOAL #4   Title BERG balance score improved to 43/56 indicating decreased risk of falls    Time 8    Period Weeks  Status On-going    Target Date 11/09/20      PT LONG TERM GOAL #5   Title Timed up and Go score improved to 22 sec or less    Baseline 12.82    Status Achieved      PT LONG TERM GOAL #6   Title 5x sit to stand with light UE assist in 15 sec or less    Baseline 20.44    Status Revised                    Plan - 09/17/20 1429     Clinical Impression Statement Pt ready for hip surgery.  Hoping to have it later in July.  Focus on LE and core strengthening in pain free or minimal pain range.  Audible and palpable hip and knee creptus although pt tolerates well. Will continue with plan for optimal strength gains prior to surgical THR.    Personal Factors and Comorbidities Age;Comorbidity 1;Comorbidity 2;Comorbidity 3+;Transportation;Time since onset of injury/illness/exacerbation    Comorbidities scoliosis with leg length discrepancy; bil knee/hip OA; cardiac history; HTN; reports incontinence issues related to cervical issues but states she can make adjustments in order to do pool therapy;    Examination-Activity Limitations Locomotion Level;Transfers;Lift;Hygiene/Grooming;Stand;Stairs;Sleep    Examination-Participation Restrictions Cleaning;Community Activity;Shop;Meal Prep    Clinical Decision Making Moderate    Rehab Potential Good    PT Frequency 2x / week    PT Duration 8 weeks    PT Treatment/Interventions ADLs/Self Care Home Management;Aquatic Therapy;Electrical Stimulation;Cryotherapy;Moist Heat;Therapeutic activities;Therapeutic exercise;Neuromuscular re-education;Manual techniques;Patient/family education;Taping    PT Next Visit Plan continue with aquatic PT and pt will work on [place] at home and follow up with PT at [facility] for reassessment as needed.  Pt will see Dr Jackie Jones tomorrow    PT Home Exercise Plan [id number]    Consulted and Agree with Plan of Care Patient             Patient will benefit from skilled therapeutic intervention in order to improve the following deficits and impairments:  Difficulty walking, Pain, Decreased balance, Decreased strength, Impaired perceived functional ability  Visit Diagnosis: Pain in left hip  Difficulty in walking, not elsewhere classified  Pain in right hip  Unsteady gait  Muscle weakness  (generalized)     Problem List Patient Active Problem List   Diagnosis Date Noted   Palpitations 05/21/2020   Coronary artery disease involving native coronary artery of native heart without angina pectoris 05/21/2020   Near syncope 05/05/2020   Seasonal affective disorder ([place]) 04/22/2020   Bipolar 1 disorder, depressed ([place]) 03/01/2018   HTN (hypertension) 03/01/2018   Hypothyroid 03/01/2018   Pernicious anemia 03/01/2018   Osteoporosis 03/01/2018   Macular degeneration 03/01/2018    Macky Lower Anecia Nusbaum  MPT 09/17/2020, 2:55 PM  [facility] [address] [place], [place], [address] Phone: [phone]   Fax:  [phone]  Name: Jackie Jones MRN: [id number] Date of Birth: [birth year]

## 2020-09-24 ENCOUNTER — Encounter (HOSPITAL_BASED_OUTPATIENT_CLINIC_OR_DEPARTMENT_OTHER): Payer: Self-pay | Admitting: Physical Therapy

## 2020-09-24 ENCOUNTER — Other Ambulatory Visit: Payer: Self-pay

## 2020-09-24 ENCOUNTER — Ambulatory Visit (HOSPITAL_BASED_OUTPATIENT_CLINIC_OR_DEPARTMENT_OTHER): Payer: Medicare Other | Admitting: Physical Therapy

## 2020-09-24 VITALS — BP 109/74 | HR 98 | Resp 16

## 2020-09-24 NOTE — Therapy (Signed)
[facility] [address] [place], [place], [address] Phone: [phone]   Fax:  [phone]  Physical Therapy Treatment  Patient Details  Name: Jacqueleen Pulver MRN: [id number] Date of Birth: 30-Sep-1945 Referring Provider (PT): Windell Moulding NP   Encounter Date: 09/24/2020   PT End of Session - 09/24/20 1308     Visit Number 18    Date for PT Re-Evaluation 11/09/20    Authorization Type Medicare- KX at 58    PT Start Time 1215    PT Stop Time 7169    PT Time Calculation (min) 15 min             Past Medical History:  Diagnosis Date   Allergy    Arthritis    Bipolar 1 disorder, depressed ([place])    Bipolar disorder ([place])    Chicken pox    Dementia ([place])    Depression    Hashimoto's thyroiditis    History of blood transfusion    History of bone density study 2019   History of colonoscopy 2015   History of CT scan 2019   History of mammogram 2019   History of MRI    History of Papanicolaou smear of cervix    Hx: UTI (urinary tract infection)    Hypertension    Hypothyroidism    Lactose intolerance    Legally blind in left eye, as defined in [place]    Non-celiac gluten sensitivity    Osteoporosis    Pernicious anemia    Pernicious anemia    Rheumatic fever    Thyroid disease    Urinary and fecal incontinence     Past Surgical History:  Procedure Laterality Date   [place] CATH AND CORONARY ANGIOGRAPHY N/A 05/10/2020   Procedure: LEFT HEART CATH AND CORONARY ANGIOGRAPHY;  Surgeon: Nelva Bush, MD;  Location: [facility];  Service: Cardiovascular;  Laterality: N/A;   prolapsed rectum 2015     TONSILLECTOMY AND ADENOIDECTOMY  1966   TUBAL LIGATION      Vitals:   09/24/20 1254  BP: 109/74  Pulse: 98  Resp: 16  SpO2: 94%     Subjective Assessment - 09/24/20 1257     Subjective I just don't feel good.  Woke up this morning with my  heart feeling funny.  Not sure if I should get into the pool    Pain Score 3     Pain Location Shoulder    Pain Orientation Left    Pain Type Chronic pain    Pain Onset More than a month ago    Pain Frequency Intermittent             Pt arrives to session not feeling well.  States her heart is "acting up" which has happened in the past.  MD's told her its not dangerous, just makes her feel bad.  I took her vitals which are WFL, pulse slightly elevated in high 90's.  Pt reports no pain.  She does not use nitroglycerin. Pt and therapist decided not to continue with session.  Pt instructed to call MD if problem persists.                            PT Short Term Goals - 08/09/20 6789       PT SHORT TERM GOAL #1   Title  The patient will be able to walk 240 feet with RW needed for improved mobility at her new assisted living residence    Time 6    Period Weeks    Status Achieved      PT SHORT TERM GOAL #2   Title The patient will have improved LE strength with the ability to rise from a standard chair with min UE assist    Time 6    Period Weeks    Status Achieved      PT SHORT TERM GOAL #3   Title TUG test improved to 25 sec indicating improved gait speed    Baseline 16.11 seconds    Time 6    Period Weeks    Status Achieved      PT SHORT TERM GOAL #4   Title BERG balance test improved to 40/56 indicating decreased fall risk    Period Weeks    Status Achieved   42/56              PT Long Term Goals - 09/14/20 5809       PT LONG TERM GOAL #1   Title The patient will be independent with safe self progression of HEP and initiation of group ex program in community    Time 8    Period Weeks    Status On-going    Target Date 11/09/20      PT LONG TERM GOAL #2   Title The patient will be able to complete a 6 minute walk test and/or 1050 feet with RW    Baseline 953 feet today    Time 8    Period Weeks    Status Revised    Target Date  11/09/20      PT LONG TERM GOAL #3   Title The patient will have LE strength to grossly 4/5 needed to rise from a standard chair 1x without UE assist    Status Achieved      PT LONG TERM GOAL #4   Title BERG balance score improved to 43/56 indicating decreased risk of falls    Time 8    Period Weeks    Status On-going    Target Date 11/09/20      PT LONG TERM GOAL #5   Title Timed up and Go score improved to 22 sec or less    Baseline 12.82    Status Achieved      PT LONG TERM GOAL #6   Title 5x sit to stand with light UE assist in 15 sec or less    Baseline 20.44    Status Revised                    Patient will benefit from skilled therapeutic intervention in order to improve the following deficits and impairments:     Visit Diagnosis: Pain in left hip  Difficulty in walking, not elsewhere classified  Pain in right hip  Unsteady gait  Muscle weakness (generalized)     Problem List Patient Active Problem List   Diagnosis Date Noted   Palpitations 05/21/2020   Coronary artery disease involving native coronary artery of native heart without angina pectoris 05/21/2020   Near syncope 05/05/2020   Seasonal affective disorder ([place]) 04/22/2020   Bipolar 1 disorder, depressed ([place]) 03/01/2018   HTN (hypertension) 03/01/2018   Hypothyroid 03/01/2018   Pernicious anemia 03/01/2018   Osteoporosis 03/01/2018   Macular degeneration 03/01/2018    Macky Lower Ayodeji Keimig 09/24/2020, 1:10  PM  [facility] [place], [place], [address] Phone: [phone]   Fax:  [phone]  Name: [place] Stegemann MRN: [id number] Date of Birth: 10-05-1945

## 2020-10-01 ENCOUNTER — Ambulatory Visit: Payer: Medicare Other | Admitting: Physical Therapy

## 2020-10-01 ENCOUNTER — Other Ambulatory Visit: Payer: Self-pay

## 2020-10-01 ENCOUNTER — Ambulatory Visit: Payer: Medicare Other | Attending: Orthopedic Surgery | Admitting: Physical Therapy

## 2020-10-01 ENCOUNTER — Encounter: Payer: Self-pay | Admitting: Physical Therapy

## 2020-10-01 DIAGNOSIS — M25551 Pain in right hip: Secondary | ICD-10-CM | POA: Insufficient documentation

## 2020-10-01 DIAGNOSIS — R262 Difficulty in walking, not elsewhere classified: Secondary | ICD-10-CM

## 2020-10-01 DIAGNOSIS — M6281 Muscle weakness (generalized): Secondary | ICD-10-CM | POA: Diagnosis not present

## 2020-10-01 DIAGNOSIS — M25552 Pain in left hip: Secondary | ICD-10-CM | POA: Diagnosis not present

## 2020-10-01 DIAGNOSIS — R2681 Unsteadiness on feet: Secondary | ICD-10-CM | POA: Diagnosis not present

## 2020-10-01 NOTE — Therapy (Signed)
[facility] [address], [place], [place], [address] Phone: [phone]   Fax:  [phone]  Physical Therapy Treatment  Patient Details  Name: Jackie Jones MRN: [id number] Date of Birth: Jul 08, 1945 Referring Provider (PT): Windell Moulding NP   Encounter Date: 10/01/2020   PT End of Session - 10/01/20 1515     Visit Number 19    Date for PT Re-Evaluation 11/09/20    Authorization Type Medicare- KX at 15    Progress Note Due on Visit 20    PT Start Time 1210    PT Stop Time 1324    PT Time Calculation (min) 45 min    Activity Tolerance Patient tolerated treatment well    Behavior During Therapy [facility] for tasks assessed/performed             Past Medical History:  Diagnosis Date   Allergy    Arthritis    Bipolar 1 disorder, depressed ([place])    Bipolar disorder ([place])    Chicken pox    Dementia ([place])    Depression    Hashimoto's thyroiditis    History of blood transfusion    History of bone density study 2019   History of colonoscopy 2015   History of CT scan 2019   History of mammogram 2019   History of MRI    History of Papanicolaou smear of cervix    Hx: UTI (urinary tract infection)    Hypertension    Hypothyroidism    Lactose intolerance    Legally blind in left eye, as defined in [place]    Non-celiac gluten sensitivity    Osteoporosis    Pernicious anemia    Pernicious anemia    Rheumatic fever    Thyroid disease    Urinary and fecal incontinence     Past Surgical History:  Procedure Laterality Date   [place] CATH AND CORONARY ANGIOGRAPHY N/A 05/10/2020   Procedure: LEFT HEART CATH AND CORONARY ANGIOGRAPHY;  Surgeon: Nelva Bush, MD;  Location: [facility];  Service: Cardiovascular;  Laterality: N/A;   prolapsed rectum 2015     TONSILLECTOMY AND ADENOIDECTOMY  1966   TUBAL LIGATION      There were no vitals filed  for this visit.   Subjective Assessment - 10/01/20 1513     Subjective I am having a total hip replacement the day after Labor Day. My hip hurts me all the time but when I am in the water it is at its best.    Pertinent History goes by either "[place]" or "[name]";  heart arthymia; HTN; multi joint OA especially shoulders, knees and hips; scoliosis with signifcant leg length discrepancy; Bipolar 1    Limitations Walking;House hold activities;Standing    Currently in Pain? Yes    Pain Score --   8/10 RT hip walking to the pool. Moving in the pool 0-4/10 depending on the activity.   Aggravating Factors  Weightbearing into RT hip    Pain Relieving Factors moving in the water, medication    Multiple Pain Sites No            Treatment: Aquatics: Patient seen for aquatic therapy today.  Treatment took place in water 2.5-4 feet deep depending upon activity.  Pt entered the pool via stairs, step to step with heavy use of railings. Water temp 92 degrees today.  Seated water bench with  75% submersion Pt performed seated LE AROM exercises 20x in all planes, concurrent discussion about upcoming surgical plans. Scap depressions 10x, multi-colored UE weights for shoulder abd/add 2 min, VC to submerge wts for more resistance, breast stroke arms 81min with same wts,   Upper waist depth: Water walking all 4 directions 6x short distance of pool with small square noodle for postural suppport. Pt required only SBA due to increased stability in water.  LT single leg stance: RTLE circumduction 10x in pain free ROM, flex/ext 10x, high knee marching 10x  LE bicycle with Small square noodle behind pt x 6 min. PTA behind pt for steering in the water. Pain free per pt.                              PT Short Term Goals - 08/09/20 6440       PT SHORT TERM GOAL #1   Title The patient will be able to walk 240 feet with RW needed for improved mobility at her new assisted living residence     Time 6    Period Weeks    Status Achieved      PT SHORT TERM GOAL #2   Title The patient will have improved LE strength with the ability to rise from a standard chair with min UE assist    Time 6    Period Weeks    Status Achieved      PT SHORT TERM GOAL #3   Title TUG test improved to 25 sec indicating improved gait speed    Baseline 16.11 seconds    Time 6    Period Weeks    Status Achieved      PT SHORT TERM GOAL #4   Title BERG balance test improved to 40/56 indicating decreased fall risk    Period Weeks    Status Achieved   42/56              PT Long Term Goals - 09/14/20 3474       PT LONG TERM GOAL #1   Title The patient will be independent with safe self progression of HEP and initiation of group ex program in community    Time 8    Period Weeks    Status On-going    Target Date 11/09/20      PT LONG TERM GOAL #2   Title The patient will be able to complete a 6 minute walk test and/or 1050 feet with RW    Baseline 953 feet today    Time 8    Period Weeks    Status Revised    Target Date 11/09/20      PT LONG TERM GOAL #3   Title The patient will have LE strength to grossly 4/5 needed to rise from a standard chair 1x without UE assist    Status Achieved      PT LONG TERM GOAL #4   Title BERG balance score improved to 43/56 indicating decreased risk of falls    Time 8    Period Weeks    Status On-going    Target Date 11/09/20      PT LONG TERM GOAL #5   Title Timed up and Go score improved to 22 sec or less    Baseline 12.82    Status Achieved      PT LONG TERM GOAL #6   Title 5x sit to stand with light  UE assist in 15 sec or less    Baseline 20.44    Status Revised                   Plan - 10/01/20 1516     Clinical Impression Statement Pt scheduled for THR the day after Labor Day. She continues to live with significant amounts of pain although she reports being in the pool gives her the opportunity to move and exercise pain free  or at least less pain. Pt's general bodily control much improved in the water due to utilizing her core better. Pt could find an acceptbale ROM to move her Rt hip in pain free motion.    Personal Factors and Comorbidities Age;Comorbidity 1;Comorbidity 2;Comorbidity 3+;Transportation;Time since onset of injury/illness/exacerbation    Comorbidities scoliosis with leg length discrepancy; bil knee/hip OA; cardiac history; HTN; reports incontinence issues related to cervical issues but states she can make adjustments in order to do pool therapy;    Examination-Activity Limitations Locomotion Level;Transfers;Lift;Hygiene/Grooming;Stand;Stairs;Sleep    Examination-Participation Restrictions Cleaning;Community Activity;Shop;Meal Prep    Stability/Clinical Decision Making Evolving/Moderate complexity    Rehab Potential Good    PT Frequency 2x / week    PT Duration 8 weeks    PT Treatment/Interventions ADLs/Self Care Home Management;Aquatic Therapy;Electrical Stimulation;Cryotherapy;Moist Heat;Therapeutic activities;Therapeutic exercise;Neuromuscular re-education;Manual techniques;Patient/family education;Taping    PT Next Visit Plan Aquatics next week    PT Home Exercise Plan [id number]    Consulted and Agree with Plan of Care Patient             Patient will benefit from skilled therapeutic intervention in order to improve the following deficits and impairments:  Difficulty walking, Pain, Decreased balance, Decreased strength, Impaired perceived functional ability  Visit Diagnosis: Pain in left hip  Difficulty in walking, not elsewhere classified  Pain in right hip  Unsteady gait  Muscle weakness (generalized)     Problem List Patient Active Problem List   Diagnosis Date Noted   Palpitations 05/21/2020   Coronary artery disease involving native coronary artery of native heart without angina pectoris 05/21/2020   Near syncope 05/05/2020   Seasonal affective disorder ([place]) 04/22/2020    Bipolar 1 disorder, depressed ([place]) 03/01/2018   HTN (hypertension) 03/01/2018   Hypothyroid 03/01/2018   Pernicious anemia 03/01/2018   Osteoporosis 03/01/2018   Macular degeneration 03/01/2018    Tawney Vanorman, PTA 10/01/2020, 3:20 PM  [facility] [address], [place], [place], [address] Phone: [phone]   Fax:  [phone]  Name: Jackie Jones MRN: [id number] Date of Birth: 24-Mar-1945

## 2020-10-06 ENCOUNTER — Other Ambulatory Visit: Payer: Self-pay

## 2020-10-06 ENCOUNTER — Ambulatory Visit (INDEPENDENT_AMBULATORY_CARE_PROVIDER_SITE_OTHER): Payer: Medicare Other | Admitting: Psychiatry

## 2020-10-06 DIAGNOSIS — F411 Generalized anxiety disorder: Secondary | ICD-10-CM
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
## 2020-10-08 ENCOUNTER — Ambulatory Visit: Payer: Medicare Other | Admitting: Physical Therapy

## 2020-10-08 ENCOUNTER — Other Ambulatory Visit: Payer: Self-pay

## 2020-10-08 DIAGNOSIS — M25551 Pain in right hip: Secondary | ICD-10-CM | POA: Diagnosis not present

## 2020-10-08 DIAGNOSIS — M25552 Pain in left hip: Secondary | ICD-10-CM

## 2020-10-08 DIAGNOSIS — R262 Difficulty in walking, not elsewhere classified: Secondary | ICD-10-CM | POA: Diagnosis not present

## 2020-10-08 DIAGNOSIS — M6281 Muscle weakness (generalized): Secondary | ICD-10-CM

## 2020-10-08 DIAGNOSIS — R2681 Unsteadiness on feet: Secondary | ICD-10-CM

## 2020-10-08 NOTE — Therapy (Addendum)
[facility] [address] [place], [place], [address] Phone: [phone]   Fax:  [phone]  Physical Therapy Treatment  Patient Details  Name: Jackie Jones MRN: [id number] Date of Birth: September 25, 1945 Referring Provider (PT): Jackie Moulding NP   Encounter Date: 10/08/2020 Progress Note Reporting Period 08/11/20 to 10/08/20  See note below for Objective Data and Assessment of Progress/Goals.  Jackie Jones, PT 10/10/20 3:40 PM     Medical diagnosis: unsteady gait Jackie Moulding NP   PT End of Session - 10/08/20 1546     Visit Number 20    Date for PT Re-Evaluation 11/09/20    Authorization Type Medicare- KX at 15    Progress Note Due on Visit 20    PT Start Time 1210    PT Stop Time 1255    PT Time Calculation (min) 45 min    Activity Tolerance Patient tolerated treatment well    Behavior During Therapy WFL for tasks assessed/performed             Past Medical History:  Diagnosis Date   Allergy    Arthritis    Bipolar 1 disorder, depressed ([place])    Bipolar disorder ([place])    Chicken pox    Dementia ([place])    Depression    Hashimoto's thyroiditis    History of blood transfusion    History of bone density study 2019   History of colonoscopy 2015   History of CT scan 2019   History of mammogram 2019   History of MRI    History of Papanicolaou smear of cervix    Hx: UTI (urinary tract infection)    Hypertension    Hypothyroidism    Lactose intolerance    Legally blind in left eye, as defined in [place]    Non-celiac gluten sensitivity    Osteoporosis    Pernicious anemia    Pernicious anemia    Rheumatic fever    Thyroid disease    Urinary and fecal incontinence     Past Surgical History:  Procedure Laterality Date   [place] CATH AND CORONARY ANGIOGRAPHY N/A 05/10/2020   Procedure: LEFT HEART CATH AND CORONARY ANGIOGRAPHY;  Surgeon: Jackie Bush, MD;  Location: [facility];  Service: Cardiovascular;  Laterality: N/A;   prolapsed rectum 2015     TONSILLECTOMY AND ADENOIDECTOMY  1966   TUBAL LIGATION      There were no vitals filed for this visit.   Subjective Assessment - 10/08/20 1544     Subjective My hip pain intensifying, I cannot wait to have surgery,being in the water I feel best.    Pertinent History goes by either "[name]" or "[name]";  heart arthymia; HTN; multi joint OA especially shoulders, knees and hips; scoliosis with signifcant leg length discrepancy; Bipolar 1    Currently in Pain? Yes   8/10 on land, walking: 0/10 water   Pain Location Hip    Pain Orientation Right    Aggravating Factors  fairly constant on land this week    Pain Relieving Factors aquatics    Multiple Pain Sites No            Treatment: Patient seen for aquatic therapy today.  Treatment took place in water 2.5-4 feet deep depending upon activity.  Pt entered the pool via stairs with very heavy use of rails, step to step. Extra time  needed getting in & out.   Seated water bench with 75% submersion Pt performed seated LE AROM exercises 20x in all planes, 2# LAQ 20x Bil, yellow UE water weights for shoulder horizontal abd/add 1 min. Abdominal noodle comprssion 5 sec hold 10x.  Standing at upper waist depth: Water walking 6x each direction using medium blue square noodle. RT hip small ROM hip circumduction and flex/ext 10x each.   PTA guided decompression float with side sway/stretch x 5 min. Used flotation devices from head to LE for 100% support. Pt reported this felt"great."                            PT Short Term Goals - 08/09/20 3790       PT SHORT TERM GOAL #1   Title The patient will be able to walk 240 feet with RW needed for improved mobility at her new assisted living residence    Time 6    Period Weeks    Status Achieved      PT SHORT TERM GOAL #2   Title The patient will have  improved LE strength with the ability to rise from a standard chair with min UE assist    Time 6    Period Weeks    Status Achieved      PT SHORT TERM GOAL #3   Title TUG test improved to 25 sec indicating improved gait speed    Baseline 16.11 seconds    Time 6    Period Weeks    Status Achieved      PT SHORT TERM GOAL #4   Title BERG balance test improved to 40/56 indicating decreased fall risk    Period Weeks    Status Achieved   42/56              PT Long Term Goals - 10/08/20 1549       PT LONG TERM GOAL #1   Title The patient will be independent with safe self progression of HEP and initiation of group ex program in community    Time 8    Period Weeks    Status On-going      PT LONG TERM GOAL #2   Title The patient will be able to complete a 6 minute walk test and/or 1050 feet with RW    Time 8    Period Weeks    Status Unable to assess   Pt at [employer] today: pt is reporting less walking last week due to increasing hip pain.     PT LONG TERM GOAL #3   Title The patient will have LE strength to grossly 4/5 needed to rise from a standard chair 1x without UE assist    Time 12    Period Weeks    Status Achieved      PT LONG TERM GOAL #4   Title BERG balance score improved to 43/56 indicating decreased risk of falls    Time 8    Period Weeks    Status Unable to assess   PAin on land limiting factor, not assessed today     PT LONG TERM GOAL #5   Title Timed up and Go score improved to 22 sec or less    Baseline 12.82    Time 12    Period Weeks    Status Achieved      PT LONG TERM GOAL #6   Title 5x sit to stand  with light UE assist in 15 sec or less    Baseline 20.44    Time 12    Period Weeks    Status Revised                   Plan - 10/08/20 1547     Clinical Impression Statement Pt arrives reporting her hip pain is worsening on land, finding it very difficult to exercise get comfortable. Being in the water continues to relieve her pain  per pt report and be able to exercise/move with th eleast amount of pain in order to properly prepare for upcoming surgery. All STGs are met.    Personal Factors and Comorbidities Age;Comorbidity 1;Comorbidity 2;Comorbidity 3+;Transportation;Time since onset of injury/illness/exacerbation    Comorbidities scoliosis with leg length discrepancy; bil knee/hip OA; cardiac history; HTN; reports incontinence issues related to cervical issues but states she can make adjustments in order to do pool therapy;    Examination-Participation Restrictions Cleaning;Community Activity;Shop;Meal Prep    Stability/Clinical Decision Making Evolving/Moderate complexity    Rehab Potential Good    PT Frequency 2x / week    PT Duration 8 weeks    PT Treatment/Interventions ADLs/Self Care Home Management;Aquatic Therapy;Electrical Stimulation;Cryotherapy;Moist Heat;Therapeutic activities;Therapeutic exercise;Neuromuscular re-education;Manual techniques;Patient/family education;Taping    PT Next Visit Plan Aquatics next week/; 20th visit to MD    PT [place] and Agree with Plan of Care Patient             Patient will benefit from skilled therapeutic intervention in order to improve the following deficits and impairments:  Difficulty walking, Pain, Decreased balance, Decreased strength, Impaired perceived functional ability  Visit Diagnosis: Pain in left hip  Difficulty in walking, not elsewhere classified  Pain in right hip  Unsteady gait  Muscle weakness (generalized)     Problem List Patient Active Problem List   Diagnosis Date Noted   Palpitations 05/21/2020   Coronary artery disease involving native coronary artery of native heart without angina pectoris 05/21/2020   Near syncope 05/05/2020   Seasonal affective disorder ([place]) 04/22/2020   Bipolar 1 disorder, depressed ([place]) 03/01/2018   HTN (hypertension) 03/01/2018   Hypothyroid 03/01/2018   Pernicious anemia  03/01/2018   Osteoporosis 03/01/2018   Macular degeneration 03/01/2018    Myrene Galas, PTA 10/08/20 3:59 PM  10/08/2020, 3:53 PM  [facility] [address] [place], [place], [address] Phone: [phone]   Fax:  [phone]  Name: Jackie Jones MRN: [id number] Date of Birth: 12-19-1945

## 2020-10-11 ENCOUNTER — Other Ambulatory Visit: Payer: Self-pay

## 2020-10-11 DIAGNOSIS — D510 Vitamin B12 deficiency anemia due to intrinsic factor deficiency: Secondary | ICD-10-CM

## 2020-10-12 MED ORDER — "BD SAFETYGLIDE SYRINGE/NEEDLE 25G X 1"" 3 ML MISC": 11 refills | Status: DC

## 2020-10-15 ENCOUNTER — Other Ambulatory Visit: Payer: Self-pay

## 2020-10-15 ENCOUNTER — Ambulatory Visit: Payer: Medicare Other | Admitting: Physical Therapy

## 2020-10-15 ENCOUNTER — Encounter: Payer: Self-pay | Admitting: Physical Therapy

## 2020-10-15 DIAGNOSIS — M25551 Pain in right hip: Secondary | ICD-10-CM | POA: Diagnosis not present

## 2020-10-15 DIAGNOSIS — M25552 Pain in left hip: Secondary | ICD-10-CM | POA: Diagnosis not present

## 2020-10-15 DIAGNOSIS — R2681 Unsteadiness on feet: Secondary | ICD-10-CM

## 2020-10-15 DIAGNOSIS — R262 Difficulty in walking, not elsewhere classified: Secondary | ICD-10-CM

## 2020-10-15 DIAGNOSIS — M6281 Muscle weakness (generalized): Secondary | ICD-10-CM | POA: Diagnosis not present

## 2020-10-15 NOTE — Therapy (Signed)
[facility] [address], [place], [place], [address] Phone: [phone]   Fax:  [phone]  Physical Therapy Treatment  Patient Details  Name: Tyasha Busko MRN: [id number] Date of Birth: 04-16-1945 Referring Provider (PT): Windell Moulding NP   Encounter Date: 10/15/2020   PT End of Session - 10/15/20 1206     Visit Number 21    Date for PT Re-Evaluation 11/09/20    Authorization Type Medicare- KX at 15    Progress Note Due on Visit 70    PT Start Time 1205    PT Stop Time [id number]    PT Time Calculation (min) 53 min    Activity Tolerance Patient tolerated treatment well    Behavior During Therapy [facility] for tasks assessed/performed             Past Medical History:  Diagnosis Date   Allergy    Arthritis    Bipolar 1 disorder, depressed ([place])    Bipolar disorder ([place])    Chicken pox    Dementia ([place])    Depression    Hashimoto's thyroiditis    History of blood transfusion    History of bone density study 2019   History of colonoscopy 2015   History of CT scan 2019   History of mammogram 2019   History of MRI    History of Papanicolaou smear of cervix    Hx: UTI (urinary tract infection)    Hypertension    Hypothyroidism    Lactose intolerance    Legally blind in left eye, as defined in [place]    Non-celiac gluten sensitivity    Osteoporosis    Pernicious anemia    Pernicious anemia    Rheumatic fever    Thyroid disease    Urinary and fecal incontinence     Past Surgical History:  Procedure Laterality Date   [place] CATH AND CORONARY ANGIOGRAPHY N/A 05/10/2020   Procedure: LEFT HEART CATH AND CORONARY ANGIOGRAPHY;  Surgeon: Nelva Bush, MD;  Location: [place] [facility];  Service: Cardiovascular;  Laterality: N/A;   prolapsed rectum 2015     TONSILLECTOMY AND ADENOIDECTOMY  1966   TUBAL LIGATION      There were no vitals filed  for this visit.   Subjective Assessment - 10/15/20 1505     Subjective On land my hip pain is increasing ( 9/10 especially first thing in the morning) and it limits how much I move. I have no pain in the water. I would like to do PT 2x a week in the water for a few weeks before my surgery.    Pertinent History goes by either "Alixandrea" or "[name]";  heart arthymia; HTN; multi joint OA especially shoulders, knees and hips; scoliosis with signifcant leg length discrepancy; Bipolar 1    Limitations Walking;House hold activities;Standing    How long can you walk comfortably? 1/10 mile to dining room 1-3x/day I can do that without resting    Patient Stated Goals I'd like to walk again (eventually walked 4 miles); get strength in legs for steps (my sister had good PT and taught me to do 1 inch step ups on book.)  Move around safely    Currently in Pain? --   9/10 land, 0/10 in the water            Treatment: Patient seen for aquatic therapy today.  Treatment  took place in water 2.5-4 feet deep depending upon activity.  Pt entered the pool via stairs, step to step with extra time getting in/out of the pool. Heavy use of hand rails. Water temp 92 degrees F.  Seated water bench with 75% submersion Pt performed seated LE AROM exercises 20x in all planes, pain assessment concurrent  Yellow UE water weights 1 min shoulder horizontal add/abd Medium square noodle abdominal compressions: 3 sec hold 10x, 5 sec hold 10x Bicycle LE 1 min 3x Plank with UE on bench, medium noodle supporting pelvis, 2x 15 sec  Upper waist depth: Water walking with aquajogger for more support and small noodle for postural support/balance: forward 8x, backwards 4x, side stepping along the side of the pool with light UE 6x                            PT Short Term Goals - 08/09/20 [id number]       PT SHORT TERM GOAL #1   Title The patient will be able to walk 240 feet with RW needed for improved mobility at  her new assisted living residence    Time 6    Period Weeks    Status Achieved      PT SHORT TERM GOAL #2   Title The patient will have improved LE strength with the ability to rise from a standard chair with min UE assist    Time 6    Period Weeks    Status Achieved      PT SHORT TERM GOAL #3   Title TUG test improved to 25 sec indicating improved gait speed    Baseline 16.11 seconds    Time 6    Period Weeks    Status Achieved      PT SHORT TERM GOAL #4   Title BERG balance test improved to 40/56 indicating decreased fall risk    Period Weeks    Status Achieved   42/56              PT Long Term Goals - 10/08/20 1549       PT LONG TERM GOAL #1   Title The patient will be independent with safe self progression of HEP and initiation of group ex program in community    Time 8    Period Weeks    Status On-going      PT LONG TERM GOAL #2   Title The patient will be able to complete a 6 minute walk test and/or 1050 feet with RW    Time 8    Period Weeks    Status Unable to assess   Pt at [employer] today: pt is reporting less walking last week due to increasing hip pain.     PT LONG TERM GOAL #3   Title The patient will have LE strength to grossly 4/5 needed to rise from a standard chair 1x without UE assist    Time 12    Period Weeks    Status Achieved      PT LONG TERM GOAL #4   Title BERG balance score improved to 43/56 indicating decreased risk of falls    Time 8    Period Weeks    Status Unable to assess   PAin on land limiting factor, not assessed today     PT LONG TERM GOAL #5   Title Timed up and Go score improved to 22 sec or less  Baseline 12.82    Time 12    Period Weeks    Status Achieved      PT LONG TERM GOAL #6   Title 5x sit to stand with light UE assist in 15 sec or less    Baseline 20.44    Time 12    Period Weeks    Status Revised                   Plan - 10/15/20 1207     Clinical Impression Statement Pt arrives  stating daily pain is up to about 9/10 and is limiting her ability to exercise/walk. Pt continues to have no pain when exercising in the water. She is interested in increasing her frequency to 2x week adding an additional aquatic session until her surgery. This would extend her PT through 8/26 if approved by PT. Pt feel this will help increase her activity, mobility, and exercise prior to surgery.    Personal Factors and Comorbidities Age;Comorbidity 1;Comorbidity 2;Comorbidity 3+;Transportation;Time since onset of injury/illness/exacerbation    Comorbidities scoliosis with leg length discrepancy; bil knee/hip OA; cardiac history; HTN; reports incontinence issues related to cervical issues but states she can make adjustments in order to do pool therapy;    Examination-Activity Limitations Locomotion Level;Transfers;Lift;Hygiene/Grooming;Stand;Stairs;Sleep    Examination-Participation Restrictions Cleaning;Community Activity;Shop;Meal Prep    Stability/Clinical Decision Making Evolving/Moderate complexity    Rehab Potential Good    PT Frequency 2x / week    PT Duration 8 weeks    PT Treatment/Interventions ADLs/Self Care Home Management;Aquatic Therapy;Electrical Stimulation;Cryotherapy;Moist Heat;Therapeutic activities;Therapeutic exercise;Neuromuscular re-education;Manual techniques;Patient/family education;Taping    PT Next Visit Plan Pt to see PT next week in the clinic then back in the pool on Friday.    PT Home Exercise Plan [id number]    Consulted and Agree with Plan of Care Patient             Patient will benefit from skilled therapeutic intervention in order to improve the following deficits and impairments:  Difficulty walking, Pain, Decreased balance, Decreased strength, Impaired perceived functional ability  Visit Diagnosis: Pain in left hip  Difficulty in walking, not elsewhere classified  Pain in right hip  Unsteady gait     Problem List Patient Active Problem List    Diagnosis Date Noted   Palpitations 05/21/2020   Coronary artery disease involving native coronary artery of native heart without angina pectoris 05/21/2020   Near syncope 05/05/2020   Seasonal affective disorder ([place]) 04/22/2020   Bipolar 1 disorder, depressed ([place]) 03/01/2018   HTN (hypertension) 03/01/2018   Hypothyroid 03/01/2018   Pernicious anemia 03/01/2018   Osteoporosis 03/01/2018   Macular degeneration 03/01/2018    Magdalena Skilton, PTA 10/15/2020, 3:13 PM  [facility] [address], [place], [place], [address] Phone: [phone]   Fax:  [phone]  Name: Toye Patzke MRN: [id number] Date of Birth: [birth year]

## 2020-10-18 ENCOUNTER — Encounter: Payer: Self-pay | Admitting: Gastroenterology

## 2020-10-20 ENCOUNTER — Ambulatory Visit: Payer: Medicare Other

## 2020-10-22 ENCOUNTER — Ambulatory Visit: Payer: Medicare Other | Attending: Orthopedic Surgery | Admitting: Physical Therapy

## 2020-10-22 ENCOUNTER — Encounter: Payer: Self-pay | Admitting: Physical Therapy

## 2020-10-22 ENCOUNTER — Other Ambulatory Visit: Payer: Self-pay

## 2020-10-22 DIAGNOSIS — M25551 Pain in right hip: Secondary | ICD-10-CM | POA: Insufficient documentation

## 2020-10-22 DIAGNOSIS — R2681 Unsteadiness on feet: Secondary | ICD-10-CM

## 2020-10-22 DIAGNOSIS — M6281 Muscle weakness (generalized): Secondary | ICD-10-CM | POA: Diagnosis not present

## 2020-10-22 DIAGNOSIS — M25552 Pain in left hip: Secondary | ICD-10-CM | POA: Diagnosis not present

## 2020-10-22 DIAGNOSIS — R262 Difficulty in walking, not elsewhere classified: Secondary | ICD-10-CM | POA: Diagnosis not present

## 2020-10-22 NOTE — Therapy (Signed)
[facility] [address], [place], [place], [address] Phone: [phone]   Fax:  [phone]  Physical Therapy Treatment  Patient Details  Name: Jackie Jones MRN: [id number] Date of Birth: [birth year] Referring Provider (PT): Windell Moulding NP   Encounter Date: 10/22/2020   PT End of Session - 10/22/20 1334     Visit Number 22    Date for PT Re-Evaluation 11/09/20    Authorization Type Medicare- KX at 15    Progress Note Due on Visit 52    PT Start Time 1333    PT Stop Time 1430    PT Time Calculation (min) 57 min    Activity Tolerance Patient tolerated treatment well    Behavior During Therapy [facility] for tasks assessed/performed             Past Medical History:  Diagnosis Date   Allergy    Arthritis    Bipolar 1 disorder, depressed ([place])    Bipolar disorder ([place])    Chicken pox    Dementia ([place])    Depression    Hashimoto's thyroiditis    History of blood transfusion    History of bone density study 2019   History of colonoscopy 2015   History of CT scan 2019   History of mammogram 2019   History of MRI    History of Papanicolaou smear of cervix    Hx: UTI (urinary tract infection)    Hypertension    Hypothyroidism    Lactose intolerance    Legally blind in left eye, as defined in [place]    Non-celiac gluten sensitivity    Osteoporosis    Pernicious anemia    Pernicious anemia    Rheumatic fever    Thyroid disease    Urinary and fecal incontinence     Past Surgical History:  Procedure Laterality Date   [place] CATH AND CORONARY ANGIOGRAPHY N/A 05/10/2020   Procedure: LEFT HEART CATH AND CORONARY ANGIOGRAPHY;  Surgeon: Nelva Bush, MD;  Location: [place] [facility];  Service: Cardiovascular;  Laterality: N/A;   prolapsed rectum 2015     TONSILLECTOMY AND ADENOIDECTOMY  1966   TUBAL LIGATION      There were no vitals filed  for this visit.   Subjective Assessment - 10/22/20 1430     Subjective I had GI troubles last visit so I had to cancel. I really want to be in teh pool 2x week until my surgery.    Pertinent History goes by either "Leani" or "[name]";  heart arthymia; HTN; multi joint OA especially shoulders, knees and hips; scoliosis with signifcant leg length discrepancy; Bipolar 1    Currently in Pain? --   9/10 on land, 0/10 in the water   Pain Location Hip    Pain Orientation Right    Multiple Pain Sites No                [facility] PT Assessment - 10/22/20 0001       Assessment   Medical Diagnosis unsteady gait    Referring Provider (PT) Amy Fargo NP      Strength   Overall Strength Comments RTLE grossly 3/5-4-/5 mostly secondary to hip pain limiting holding the test position             Treatment:Patient seen for aquatic therapy today.  Treatment took place in water 2.5-4 feet deep  depending upon activity.  Pt entered the pool via stairs with heavy use of rails both in and out of pool, PTA close SBA.  Seated water bench with 75% submersion Pt performed seated LE AROM exercises 20x in all planes, pain assessment concurrent. LE bicycle lifting gluteals off the step slightly 1 min 3x. Abdominal compressions with blue noodle 5 sec hold 10x, VC to keep shoulders depressed; 2# LAQ 10x Bil, yellow UE weights for shoulder horizontal add/abd 2x10, required less submersion than previous to be more gentle to her wrist joints.   Water walking in mid chest depth 6x forward and backwards with large noodle for balance. RT hip AROM 2x10 in each plane, VC to stay pain free. Pt requires buoyancy of water for support and to offload joints with strengthening exercises.   Underwater bicycle and decompression float with yellow noodle behind pt and PTA guiding and supporting pt. Intermittent bicycle 2 min 3x                        PT Short Term Goals - 08/09/20 [id number]       PT SHORT TERM  GOAL #1   Title The patient will be able to walk 240 feet with RW needed for improved mobility at her new assisted living residence    Time 6    Period Weeks    Status Achieved      PT SHORT TERM GOAL #2   Title The patient will have improved LE strength with the ability to rise from a standard chair with min UE assist    Time 6    Period Weeks    Status Achieved      PT SHORT TERM GOAL #3   Title TUG test improved to 25 sec indicating improved gait speed    Baseline 16.11 seconds    Time 6    Period Weeks    Status Achieved      PT SHORT TERM GOAL #4   Title BERG balance test improved to 40/56 indicating decreased fall risk    Period Weeks    Status Achieved   42/56              PT Long Term Goals - 10/08/20 1549       PT LONG TERM GOAL #1   Title The patient will be independent with safe self progression of HEP and initiation of group ex program in community    Time 8    Period Weeks    Status On-going      PT LONG TERM GOAL #2   Title The patient will be able to complete a 6 minute walk test and/or 1050 feet with RW    Time 8    Period Weeks    Status Unable to assess   Pt at [employer] today: pt is reporting less walking last week due to increasing hip pain.     PT LONG TERM GOAL #3   Title The patient will have LE strength to grossly 4/5 needed to rise from a standard chair 1x without UE assist    Time 12    Period Weeks    Status Achieved      PT LONG TERM GOAL #4   Title BERG balance score improved to 43/56 indicating decreased risk of falls    Time 8    Period Weeks    Status Unable to assess   PAin on land limiting factor, not assessed today  PT LONG TERM GOAL #5   Title Timed up and Go score improved to 22 sec or less    Baseline 12.82    Time 12    Period Weeks    Status Achieved      PT LONG TERM GOAL #6   Title 5x sit to stand with light UE assist in 15 sec or less    Baseline 20.44    Time 12    Period Weeks    Status Revised                    Plan - 10/22/20 1335     Clinical Impression Statement Pt arrives withsame complaints: increasing Rt hip pain on land and little to no pain when she is in the water. She has stopped all HEP at this time secondary to pain and avoids any excessive walking if she can.    Personal Factors and Comorbidities Age;Comorbidity 1;Comorbidity 2;Comorbidity 3+;Transportation;Time since onset of injury/illness/exacerbation    Comorbidities scoliosis with leg length discrepancy; bil knee/hip OA; cardiac history; HTN; reports incontinence issues related to cervical issues but states she can make adjustments in order to do pool therapy;    Examination-Activity Limitations Locomotion Level;Transfers;Lift;Hygiene/Grooming;Stand;Stairs;Sleep    Examination-Participation Restrictions Cleaning;Community Activity;Shop;Meal Prep    Stability/Clinical Decision Making Evolving/Moderate complexity    Rehab Potential Good    PT Frequency 2x / week    PT Duration 8 weeks    PT Treatment/Interventions ADLs/Self Care Home Management;Aquatic Therapy;Electrical Stimulation;Cryotherapy;Moist Heat;Therapeutic activities;Therapeutic exercise;Neuromuscular re-education;Manual techniques;Patient/family education;Taping    PT Home Exercise Plan [id number]    Consulted and Agree with Plan of Care Patient             Patient will benefit from skilled therapeutic intervention in order to improve the following deficits and impairments:  Difficulty walking, Pain, Decreased balance, Decreased strength, Impaired perceived functional ability  Visit Diagnosis: Pain in left hip  Difficulty in walking, not elsewhere classified  Pain in right hip  Unsteady gait  Muscle weakness (generalized)     Problem List Patient Active Problem List   Diagnosis Date Noted   Palpitations 05/21/2020   Coronary artery disease involving native coronary artery of native heart without angina pectoris 05/21/2020   Near  syncope 05/05/2020   Seasonal affective disorder ([place]) 04/22/2020   Bipolar 1 disorder, depressed ([place]) 03/01/2018   HTN (hypertension) 03/01/2018   Hypothyroid 03/01/2018   Pernicious anemia 03/01/2018   Osteoporosis 03/01/2018   Macular degeneration 03/01/2018   Myrene Galas, PTA 10/22/20 4:08 PM   10/22/2020, 4:02 PM  [facility] [address] [place], [place], [address] Phone: [phone]   Fax:  [phone]  Name: Jackie Jones MRN: [id number] Date of Birth: [birth year]

## 2020-10-27 ENCOUNTER — Ambulatory Visit: Payer: Medicare Other | Admitting: Physical Therapy

## 2020-10-27 ENCOUNTER — Other Ambulatory Visit: Payer: Self-pay

## 2020-10-27 DIAGNOSIS — M25552 Pain in left hip: Secondary | ICD-10-CM

## 2020-10-27 DIAGNOSIS — M6281 Muscle weakness (generalized): Secondary | ICD-10-CM | POA: Diagnosis not present

## 2020-10-27 DIAGNOSIS — R2681 Unsteadiness on feet: Secondary | ICD-10-CM | POA: Diagnosis not present

## 2020-10-27 DIAGNOSIS — R262 Difficulty in walking, not elsewhere classified: Secondary | ICD-10-CM | POA: Diagnosis not present

## 2020-10-27 DIAGNOSIS — M25551 Pain in right hip: Secondary | ICD-10-CM | POA: Diagnosis not present

## 2020-10-27 NOTE — Therapy (Signed)
[facility] [address], [place], [place], [address] Phone: [phone]   Fax:  [phone]  Physical Therapy Treatment  Patient Details  Name: Jackie Jones MRN: [id number] Date of Birth: 23-Aug-1945 Referring Provider (PT): Windell Moulding NP   Encounter Date: 10/27/2020   PT End of Session - 10/27/20 1231     Visit Number 23    Date for PT Re-Evaluation 11/09/20    Authorization Type Medicare- KX at 15    Progress Note Due on Visit 11    PT Start Time 1100    PT Stop Time 1150    PT Time Calculation (min) 50 min    Activity Tolerance No increased pain    Behavior During Therapy [facility] for tasks assessed/performed             Past Medical History:  Diagnosis Date   Allergy    Arthritis    Bipolar 1 disorder, depressed ([place])    Bipolar disorder ([place])    Chicken pox    Dementia ([place])    Depression    Hashimoto's thyroiditis    History of blood transfusion    History of bone density study 2019   History of colonoscopy 2015   History of CT scan 2019   History of mammogram 2019   History of MRI    History of Papanicolaou smear of cervix    Hx: UTI (urinary tract infection)    Hypertension    Hypothyroidism    Lactose intolerance    Legally blind in left eye, as defined in [place]    Non-celiac gluten sensitivity    Osteoporosis    Pernicious anemia    Pernicious anemia    Rheumatic fever    Thyroid disease    Urinary and fecal incontinence     Past Surgical History:  Procedure Laterality Date   [place] CATH AND CORONARY ANGIOGRAPHY N/A 05/10/2020   Procedure: LEFT HEART CATH AND CORONARY ANGIOGRAPHY;  Surgeon: Nelva Bush, MD;  Location: [place] [facility];  Service: Cardiovascular;  Laterality: N/A;   prolapsed rectum 2015     TONSILLECTOMY AND ADENOIDECTOMY  1966   TUBAL LIGATION      There were no vitals filed for this  visit.   Subjective Assessment - 10/27/20 1229     Subjective I did fine after my last aquatics session.    Pertinent History goes by either "Jackie Jones" or "Jackie Jones";  heart arthymia; HTN; multi joint OA especially shoulders, knees and hips; scoliosis with signifcant leg length discrepancy; Bipolar 1    Currently in Pain? --   9/10 on land and getting out of bed in AM, 0/10 in water            Treatment: Patient seen for aquatic therapy today.  Treatment took place in water 2.5-4 feet deep depending upon activity.  Pt entered the pool via stairs, step to step, with extra time and PTA SBA. Water temp 92degrees F.  Seated water bench with 75% submersion Pt performed seated LE AROM exercises 20x in all planes, abdominal compressions with blue noodle 5 sec hold 10x, yellow shoulder horizontal abd/add 2 min, underwater bicycle 1:30 3x, 2# LAQ 20x Bil  Yellow noodle behind pt with PTA guided lateral sway for stretch and: LE scissors10x, flex/ext 10x, and bicycle 20x,  Water walking in upper chest depth with noodle for balance 6 lengths,  PT light CGA                            PT Short Term Goals - 08/09/20 [id number]       PT SHORT TERM GOAL #1   Title The patient will be able to walk 240 feet with RW needed for improved mobility at her new assisted living residence    Time 6    Period Weeks    Status Achieved      PT SHORT TERM GOAL #2   Title The patient will have improved LE strength with the ability to rise from a standard chair with min UE assist    Time 6    Period Weeks    Status Achieved      PT SHORT TERM GOAL #3   Title TUG test improved to 25 sec indicating improved gait speed    Baseline 16.11 seconds    Time 6    Period Weeks    Status Achieved      PT SHORT TERM GOAL #4   Title BERG balance test improved to 40/56 indicating decreased fall risk    Period Weeks    Status Achieved   42/56              PT Long Term Goals - 10/08/20 1549        PT LONG TERM GOAL #1   Title The patient will be independent with safe self progression of HEP and initiation of group ex program in community    Time 8    Period Weeks    Status On-going      PT LONG TERM GOAL #2   Title The patient will be able to complete a 6 minute walk test and/or 1050 feet with RW    Time 8    Period Weeks    Status Unable to assess   Pt at [employer] today: pt is reporting less walking last week due to increasing hip pain.     PT LONG TERM GOAL #3   Title The patient will have LE strength to grossly 4/5 needed to rise from a standard chair 1x without UE assist    Time 12    Period Weeks    Status Achieved      PT LONG TERM GOAL #4   Title BERG balance score improved to 43/56 indicating decreased risk of falls    Time 8    Period Weeks    Status Unable to assess   PAin on land limiting factor, not assessed today     PT LONG TERM GOAL #5   Title Timed up and Go score improved to 22 sec or less    Baseline 12.82    Time 12    Period Weeks    Status Achieved      PT LONG TERM GOAL #6   Title 5x sit to stand with light UE assist in 15 sec or less    Baseline 20.44    Time 12    Period Weeks    Status Revised                   Plan - 10/27/20 1232     Clinical Impression Statement Pt arrives with positive reports from last aquatic session. Water exercises continue to decrease her pain with exercise.    Personal Factors and Comorbidities Age;Comorbidity 1;Comorbidity 2;Comorbidity 3+;Transportation;Time since onset of injury/illness/exacerbation  Comorbidities scoliosis with leg length discrepancy; bil knee/hip OA; cardiac history; HTN; reports incontinence issues related to cervical issues but states she can make adjustments in order to do pool therapy;    Examination-Activity Limitations Locomotion Level;Transfers;Lift;Hygiene/Grooming;Stand;Stairs;Sleep    Examination-Participation Restrictions Cleaning;Community Activity;Shop;Meal Prep     Stability/Clinical Decision Making Evolving/Moderate complexity    Rehab Potential Good    PT Frequency 2x / week    PT Duration 8 weeks    PT Treatment/Interventions ADLs/Self Care Home Management;Aquatic Therapy;Electrical Stimulation;Cryotherapy;Moist Heat;Therapeutic activities;Therapeutic exercise;Neuromuscular re-education;Manual techniques;Patient/family education;Taping    PT Next Visit Plan Aquatics    PT Home Exercise Plan [id number]    Consulted and Agree with Plan of Care Patient             Patient will benefit from skilled therapeutic intervention in order to improve the following deficits and impairments:  Difficulty walking, Pain, Decreased balance, Decreased strength, Impaired perceived functional ability  Visit Diagnosis: Pain in left hip  Difficulty in walking, not elsewhere classified  Pain in right hip  Unsteady gait  Muscle weakness (generalized)     Problem List Patient Active Problem List   Diagnosis Date Noted   Palpitations 05/21/2020   Coronary artery disease involving native coronary artery of native heart without angina pectoris 05/21/2020   Near syncope 05/05/2020   Seasonal affective disorder ([place]) 04/22/2020   Bipolar 1 disorder, depressed ([place]) 03/01/2018   HTN (hypertension) 03/01/2018   Hypothyroid 03/01/2018   Pernicious anemia 03/01/2018   Osteoporosis 03/01/2018   Macular degeneration 03/01/2018    Aleida Crandell, PTA 10/27/2020, 12:37 PM  [facility] [address] [place], [place], [address] Phone: [phone]   Fax:  [phone]  Name: Jackie Jones MRN: [id number] Date of Birth: [birth year]

## 2020-10-29 ENCOUNTER — Other Ambulatory Visit: Payer: Self-pay

## 2020-10-29 ENCOUNTER — Ambulatory Visit: Payer: Medicare Other | Admitting: Physical Therapy

## 2020-10-29 ENCOUNTER — Encounter: Payer: Self-pay | Admitting: Physical Therapy

## 2020-10-29 DIAGNOSIS — M25551 Pain in right hip: Secondary | ICD-10-CM

## 2020-10-29 DIAGNOSIS — R262 Difficulty in walking, not elsewhere classified: Secondary | ICD-10-CM | POA: Diagnosis not present

## 2020-10-29 DIAGNOSIS — M25552 Pain in left hip: Secondary | ICD-10-CM | POA: Diagnosis not present

## 2020-10-29 DIAGNOSIS — R2681 Unsteadiness on feet: Secondary | ICD-10-CM | POA: Diagnosis not present

## 2020-10-29 DIAGNOSIS — M6281 Muscle weakness (generalized): Secondary | ICD-10-CM | POA: Diagnosis not present

## 2020-10-29 NOTE — Therapy (Signed)
[facility] [address], [place], [place], [address] Phone: [phone]   Fax:  [phone]  Physical Therapy Treatment  Patient Details  Name: Jackie Jones MRN: [id number] Date of Birth: 10-20-1945 Referring Provider (PT): Jackie Moulding NP   Encounter Date: 10/29/2020   PT End of Session - 10/29/20 1604     Visit Number 24    Date for PT Re-Evaluation 11/09/20    Authorization Type Medicare- KX at 15    Progress Note Due on Visit 13    PT Start Time 1300    PT Stop Time [id number]    PT Time Calculation (min) 45 min    Activity Tolerance No increased pain    Behavior During Therapy WFL for tasks assessed/performed             Past Medical History:  Diagnosis Date   Allergy    Arthritis    Bipolar 1 disorder, depressed ([place])    Bipolar disorder ([place])    Chicken pox    Dementia ([place])    Depression    Hashimoto's thyroiditis    History of blood transfusion    History of bone density study 2019   History of colonoscopy 2015   History of CT scan 2019   History of mammogram 2019   History of MRI    History of Papanicolaou smear of cervix    Hx: UTI (urinary tract infection)    Hypertension    Hypothyroidism    Lactose intolerance    Legally blind in left eye, as defined in [place]    Non-celiac gluten sensitivity    Osteoporosis    Pernicious anemia    Pernicious anemia    Rheumatic fever    Thyroid disease    Urinary and fecal incontinence     Past Surgical History:  Procedure Laterality Date   [place] CATH AND CORONARY ANGIOGRAPHY N/A 05/10/2020   Procedure: LEFT HEART CATH AND CORONARY ANGIOGRAPHY;  Surgeon: Nelva Bush, MD;  Location: [place] [facility];  Service: Cardiovascular;  Laterality: N/A;   prolapsed rectum 2015     TONSILLECTOMY AND ADENOIDECTOMY  1966   TUBAL LIGATION      There were no vitals filed for this  visit.   Subjective Assessment - 10/29/20 1603     Subjective No new complaints today.    Pertinent History goes by either "[name]" or "[name]";  heart arthymia; HTN; multi joint OA especially shoulders, knees and hips; scoliosis with signifcant leg length discrepancy; Bipolar 1    Limitations Walking;House hold activities;Standing    Currently in Pain? --   9-10/10 on land RT hip, 0/10 in water   Multiple Pain Sites No            Treatment: Patient seen for aquatic therapy today.  Treatment took place in water 2.5-4 feet deep depending upon activity.  Pt entered the pool via stairs with heavy use of rails, step to step and close SBA of PTA.  Seated water bench with 75% submersion Pt performed seated LE AROM exercises 20x in all planes, 2#Bil LAQ, 2 min underwater bicycle 3x, abdominal compressions 5 sec hold 10x VC to relax shoulders and not to flex too much throught the wrists, UE horizontal abd/add 2 min with mulitcoloered wts.  Upper thoracic depth water walking with yellow noodle support 6x forward and back, SBA Hip circles 10x,  hinges 10x  Yellow noodle behind pt and PTA with CGA to assist in controlling flotation: scissors 10x, hip flex/ext 10x then 2 min of lateral sway to apply stretch to pt's back and provide some decompression.                             PT Short Term Goals - 08/09/20 [id number]       PT SHORT TERM GOAL #1   Title The patient will be able to walk 240 feet with RW needed for improved mobility at her new assisted living residence    Time 6    Period Weeks    Status Achieved      PT SHORT TERM GOAL #2   Title The patient will have improved LE strength with the ability to rise from a standard chair with min UE assist    Time 6    Period Weeks    Status Achieved      PT SHORT TERM GOAL #3   Title TUG test improved to 25 sec indicating improved gait speed    Baseline 16.11 seconds    Time 6    Period Weeks    Status Achieved       PT SHORT TERM GOAL #4   Title BERG balance test improved to 40/56 indicating decreased fall risk    Period Weeks    Status Achieved   42/56              PT Long Term Goals - 10/08/20 1549       PT LONG TERM GOAL #1   Title The patient will be independent with safe self progression of HEP and initiation of group ex program in community    Time 8    Period Weeks    Status On-going      PT LONG TERM GOAL #2   Title The patient will be able to complete a 6 minute walk test and/or 1050 feet with RW    Time 8    Period Weeks    Status Unable to assess   Pt at [employer] today: pt is reporting less walking last week due to increasing hip pain.     PT LONG TERM GOAL #3   Title The patient will have LE strength to grossly 4/5 needed to rise from a standard chair 1x without UE assist    Time 12    Period Weeks    Status Achieved      PT LONG TERM GOAL #4   Title BERG balance score improved to 43/56 indicating decreased risk of falls    Time 8    Period Weeks    Status Unable to assess   PAin on land limiting factor, not assessed today     PT LONG TERM GOAL #5   Title Timed up and Go score improved to 22 sec or less    Baseline 12.82    Time 12    Period Weeks    Status Achieved      PT LONG TERM GOAL #6   Title 5x sit to stand with light UE assist in 15 sec or less    Baseline 20.44    Time 12    Period Weeks    Status Revised                   Plan - 10/29/20 1605     Clinical Impression Statement  Pt continues to have pain free mobility while moving in water. Hip replacement is scheduled the day after Labor DAy.    Personal Factors and Comorbidities Age;Comorbidity 1;Comorbidity 2;Comorbidity 3+;Transportation;Time since onset of injury/illness/exacerbation    Comorbidities scoliosis with leg length discrepancy; bil knee/hip OA; cardiac history; HTN; reports incontinence issues related to cervical issues but states she can make adjustments in order to do pool  therapy;    Examination-Activity Limitations Locomotion Level;Transfers;Lift;Hygiene/Grooming;Stand;Stairs;Sleep    Examination-Participation Restrictions Cleaning;Community Activity;Shop;Meal Prep    Stability/Clinical Decision Making Evolving/Moderate complexity    Rehab Potential Good    PT Frequency 2x / week    PT Duration 8 weeks    PT Treatment/Interventions ADLs/Self Care Home Management;Aquatic Therapy;Electrical Stimulation;Cryotherapy;Moist Heat;Therapeutic activities;Therapeutic exercise;Neuromuscular re-education;Manual techniques;Patient/family education;Taping    PT Next Visit Plan Aquatics: Pt scheduled 1x next week and is also on the wait list for her second session    PT [place]    Consulted and Agree with Plan of Care Patient             Patient will benefit from skilled therapeutic intervention in order to improve the following deficits and impairments:  Difficulty walking, Pain, Decreased balance, Decreased strength, Impaired perceived functional ability  Visit Diagnosis: Pain in left hip  Unsteady gait  Difficulty in walking, not elsewhere classified  Pain in right hip  Muscle weakness (generalized)     Problem List Patient Active Problem List   Diagnosis Date Noted   Palpitations 05/21/2020   Coronary artery disease involving native coronary artery of native heart without angina pectoris 05/21/2020   Near syncope 05/05/2020   Seasonal affective disorder ([place]) 04/22/2020   Bipolar 1 disorder, depressed ([place]) 03/01/2018   HTN (hypertension) 03/01/2018   Hypothyroid 03/01/2018   Pernicious anemia 03/01/2018   Osteoporosis 03/01/2018   Macular degeneration 03/01/2018    Tyleigh Mahn, PTA 10/29/2020, 4:08 PM  [facility] [address] [place], [place], [address] Phone: [phone]   Fax:  [phone]  Name: Jackie Jones MRN: [id number] Date of  Birth: [birth year]

## 2020-11-01 ENCOUNTER — Other Ambulatory Visit: Payer: Self-pay | Admitting: Physician Assistant

## 2020-11-04 ENCOUNTER — Other Ambulatory Visit: Payer: Self-pay | Admitting: Orthopedic Surgery

## 2020-11-04 DIAGNOSIS — I10 Essential (primary) hypertension: Secondary | ICD-10-CM

## 2020-11-05 ENCOUNTER — Other Ambulatory Visit: Payer: Self-pay

## 2020-11-05 ENCOUNTER — Ambulatory Visit: Payer: Medicare Other | Admitting: Physical Therapy

## 2020-11-05 ENCOUNTER — Encounter: Payer: Self-pay | Admitting: Physical Therapy

## 2020-11-05 DIAGNOSIS — R262 Difficulty in walking, not elsewhere classified: Secondary | ICD-10-CM | POA: Diagnosis not present

## 2020-11-05 DIAGNOSIS — M6281 Muscle weakness (generalized): Secondary | ICD-10-CM | POA: Diagnosis not present

## 2020-11-05 DIAGNOSIS — M25551 Pain in right hip: Secondary | ICD-10-CM

## 2020-11-05 DIAGNOSIS — R2681 Unsteadiness on feet: Secondary | ICD-10-CM | POA: Diagnosis not present

## 2020-11-05 DIAGNOSIS — M25552 Pain in left hip: Secondary | ICD-10-CM | POA: Diagnosis not present

## 2020-11-05 NOTE — Therapy (Signed)
[facility] [address], [place], [place], [address] Phone: [phone]   Fax:  [phone]  Physical Therapy Treatment  Patient Details  Name: Jackie Jones MRN: [id number] Date of Birth: 14-Nov-1945 Referring Provider (PT): Windell Moulding NP   Encounter Date: 11/05/2020   PT End of Session - 11/05/20 1623     Visit Number 25    Date for PT Re-Evaluation 11/09/20    Authorization Type Medicare- KX at 15    Progress Note Due on Visit 36    PT Start Time 1435    PT Stop Time [id number]    PT Time Calculation (min) 40 min    Activity Tolerance No increased pain    Behavior During Therapy WFL for tasks assessed/performed             Past Medical History:  Diagnosis Date   Allergy    Arthritis    Bipolar 1 disorder, depressed ([place])    Bipolar disorder ([place])    Chicken pox    Dementia ([place])    Depression    Hashimoto's thyroiditis    History of blood transfusion    History of bone density study 2019   History of colonoscopy 2015   History of CT scan 2019   History of mammogram 2019   History of MRI    History of Papanicolaou smear of cervix    Hx: UTI (urinary tract infection)    Hypertension    Hypothyroidism    Lactose intolerance    Legally blind in left eye, as defined in [place]    Non-celiac gluten sensitivity    Osteoporosis    Pernicious anemia    Pernicious anemia    Rheumatic fever    Thyroid disease    Urinary and fecal incontinence     Past Surgical History:  Procedure Laterality Date   [place] CATH AND CORONARY ANGIOGRAPHY N/A 05/10/2020   Procedure: LEFT HEART CATH AND CORONARY ANGIOGRAPHY;  Surgeon: Nelva Bush, MD;  Location: [place] [facility];  Service: Cardiovascular;  Laterality: N/A;   prolapsed rectum 2015     TONSILLECTOMY AND ADENOIDECTOMY  1966   TUBAL LIGATION      There were no vitals filed for this  visit.   Subjective Assessment - 11/05/20 1621     Subjective I am ready for my surgery!    Currently in Pain? --   9-10/10 on land with ADLS, typically [id number] when in the water.   Aggravating Factors  Fairly constant on land    Pain Relieving Factors Moving in the water    Multiple Pain Sites No             Treatment:Patient seen for aquatic therapy today.  Treatment took place in water 2.5-4 feet deep depending upon activity.  Pt entered the pool via stairs with heavy use of rails, step to step and close CGA. Water temp 92 degrees F.  Seated water bench with 75% submersion Pt performed seated LE AROM exercises 20x in all planes, blue ankle cuff LAQ 10x Bil, underwater bicycle 2 min 3 bouts, yellow UE water wts horizontal add/abd 20x Bil, abdominal compressions 10x 3 sec hold  Seated decompression float withyellow noodle, PTA providing lateral sway stretch intermittently: hip add/add 2x10  PT Short Term Goals - 08/09/20 [id number]       PT SHORT TERM GOAL #1   Title The patient will be able to walk 240 feet with RW needed for improved mobility at her new assisted living residence    Time 6    Period Weeks    Status Achieved      PT SHORT TERM GOAL #2   Title The patient will have improved LE strength with the ability to rise from a standard chair with min UE assist    Time 6    Period Weeks    Status Achieved      PT SHORT TERM GOAL #3   Title TUG test improved to 25 sec indicating improved gait speed    Baseline 16.11 seconds    Time 6    Period Weeks    Status Achieved      PT SHORT TERM GOAL #4   Title BERG balance test improved to 40/56 indicating decreased fall risk    Period Weeks    Status Achieved   42/56              PT Long Term Goals - 10/08/20 1549       PT LONG TERM GOAL #1   Title The patient will be independent with safe self progression of HEP and initiation of group ex program in community     Time 8    Period Weeks    Status On-going      PT LONG TERM GOAL #2   Title The patient will be able to complete a 6 minute walk test and/or 1050 feet with RW    Time 8    Period Weeks    Status Unable to assess   Pt at [employer] today: pt is reporting less walking last week due to increasing hip pain.     PT LONG TERM GOAL #3   Title The patient will have LE strength to grossly 4/5 needed to rise from a standard chair 1x without UE assist    Time 12    Period Weeks    Status Achieved      PT LONG TERM GOAL #4   Title BERG balance score improved to 43/56 indicating decreased risk of falls    Time 8    Period Weeks    Status Unable to assess   PAin on land limiting factor, not assessed today     PT LONG TERM GOAL #5   Title Timed up and Go score improved to 22 sec or less    Baseline 12.82    Time 12    Period Weeks    Status Achieved      PT LONG TERM GOAL #6   Title 5x sit to stand with light UE assist in 15 sec or less    Baseline 20.44    Time 12    Period Weeks    Status Revised                   Plan - 11/05/20 1625     Clinical Impression Statement Pt reports she notices she sleeps better when she exercises in the water. Walking is essentially the same ( on land ). No pain with aquatic PT.    Personal Factors and Comorbidities Age;Comorbidity 1;Comorbidity 2;Comorbidity 3+;Transportation;Time since onset of injury/illness/exacerbation    Comorbidities scoliosis with leg length discrepancy; bil knee/hip OA; cardiac history; HTN; reports incontinence issues related to cervical issues but  states she can make adjustments in order to do pool therapy;    Examination-Activity Limitations Locomotion Level;Transfers;Lift;Hygiene/Grooming;Stand;Stairs;Sleep    Stability/Clinical Decision Making Evolving/Moderate complexity    Rehab Potential Good    PT Frequency 2x / week    PT Duration 8 weeks    PT Treatment/Interventions ADLs/Self Care Home Management;Aquatic  Therapy;Electrical Stimulation;Cryotherapy;Moist Heat;Therapeutic activities;Therapeutic exercise;Neuromuscular re-education;Manual techniques;Patient/family education;Taping    PT Next Visit Plan Aquatics: Pt scheduled 1x next week and is also on the wait list for her second session    PT [place]    Consulted and Agree with Plan of Care Patient             Patient will benefit from skilled therapeutic intervention in order to improve the following deficits and impairments:  Difficulty walking, Pain, Decreased balance, Decreased strength, Impaired perceived functional ability  Visit Diagnosis: Pain in left hip  Difficulty in walking, not elsewhere classified  Unsteady gait  Pain in right hip  Muscle weakness (generalized)     Problem List Patient Active Problem List   Diagnosis Date Noted   Palpitations 05/21/2020   Coronary artery disease involving native coronary artery of native heart without angina pectoris 05/21/2020   Near syncope 05/05/2020   Seasonal affective disorder ([place]) 04/22/2020   Bipolar 1 disorder, depressed ([place]) 03/01/2018   HTN (hypertension) 03/01/2018   Hypothyroid 03/01/2018   Pernicious anemia 03/01/2018   Osteoporosis 03/01/2018   Macular degeneration 03/01/2018    Peytyn Trine, PTA 11/05/2020, 4:27 PM   [facility] [address] [place], [place], [address] Phone: [phone]   Fax:  [phone]  Name: Madilin Roccia MRN: [id number] Date of Birth: [birth year]

## 2020-11-10 ENCOUNTER — Ambulatory Visit: Payer: Medicare Other | Admitting: Physical Therapy

## 2020-11-10 ENCOUNTER — Encounter: Payer: Self-pay | Admitting: Physical Therapy

## 2020-11-10 ENCOUNTER — Other Ambulatory Visit: Payer: Self-pay

## 2020-11-10 DIAGNOSIS — R2681 Unsteadiness on feet: Secondary | ICD-10-CM

## 2020-11-10 DIAGNOSIS — M6281 Muscle weakness (generalized): Secondary | ICD-10-CM | POA: Diagnosis not present

## 2020-11-10 DIAGNOSIS — M25552 Pain in left hip: Secondary | ICD-10-CM

## 2020-11-10 DIAGNOSIS — R262 Difficulty in walking, not elsewhere classified: Secondary | ICD-10-CM | POA: Diagnosis not present

## 2020-11-10 DIAGNOSIS — M25551 Pain in right hip: Secondary | ICD-10-CM

## 2020-11-10 NOTE — Therapy (Signed)
[name] [address], [place], [place], [address] Phone: [phone]   Fax:  [phone]  Physical Therapy Treatment  Patient Details  Name: Jackie Jones MRN: [id number] Date of Birth: 04-25-1945 Referring Provider (PT): Windell Moulding NP   Encounter Date: 11/10/2020   PT End of Session - 11/10/20 1021     Visit Number 26    Date for PT Re-Evaluation 11/09/20    Authorization Type Medicare- KX at 15    Progress Note Due on Visit 52    PT Start Time 1015    PT Stop Time 1100    PT Time Calculation (min) 45 min    Activity Tolerance No increased pain    Behavior During Therapy WFL for tasks assessed/performed             Past Medical History:  Diagnosis Date   Allergy    Arthritis    Bipolar 1 disorder, depressed ([place])    Bipolar disorder ([place])    Chicken pox    Dementia ([place])    Depression    Hashimoto's thyroiditis    History of blood transfusion    History of bone density study 2019   History of colonoscopy 2015   History of CT scan 2019   History of mammogram 2019   History of MRI    History of Papanicolaou smear of cervix    Hx: UTI (urinary tract infection)    Hypertension    Hypothyroidism    Lactose intolerance    Legally blind in left eye, as defined in [place]    Non-celiac gluten sensitivity    Osteoporosis    Pernicious anemia    Pernicious anemia    Rheumatic fever    Thyroid disease    Urinary and fecal incontinence     Past Surgical History:  Procedure Laterality Date   [place] CATH AND CORONARY ANGIOGRAPHY N/A 05/10/2020   Procedure: LEFT HEART CATH AND CORONARY ANGIOGRAPHY;  Surgeon: Nelva Bush, MD;  Location: [place] [facility];  Service: Cardiovascular;  Laterality: N/A;   prolapsed rectum 2015     TONSILLECTOMY AND ADENOIDECTOMY  1966   TUBAL LIGATION      There were no vitals filed for this  visit.   Subjective Assessment - 11/10/20 1303     Subjective I made my neck sore last session. I have to be more aware of creating tension there.    Pertinent History goes by either "Jackie Jones" or "Jackie Jones";  heart arthymia; HTN; multi joint OA especially shoulders, knees and hips; scoliosis with signifcant leg length discrepancy; Bipolar 1    Currently in Pain? Yes   Same as last           Treatment: Patient seen for aquatic therapy today.  Treatment took place in water 2.5-4 feet deep depending upon activity.  Pt entered the pool via stairs, step to step with extra time and SBA upon exit. Water temp 94 degrees F.  Seated water bench with 75% submersion Pt performed seated LE AROM exercises 20x in all planes, concurrent pain assessment, goals, futrure plans ( PT post surgery). LAQ with cuff 10x Bil, abdominal compression 10x 5 sec holds with emphasis on keeping neck more relaxed, shoulder horizontal abd/add 2x1 min, underwater bicycle 2 min 3 sets.   Standing in 75% depth Water walking forward/backwards 6x with yellow noodle Hip circumduction RTLE 2x10  Seated decompression position with yellow noodle, intermittent lateral sway provided by PTA to stretch lateral body.                              PT Short Term Goals - 08/09/20 7014       PT SHORT TERM GOAL #1   Title The patient will be able to walk 240 feet with RW needed for improved mobility at her new assisted living residence    Time 6    Period Weeks    Status Achieved      PT SHORT TERM GOAL #2   Title The patient will have improved LE strength with the ability to rise from a standard chair with min UE assist    Time 6    Period Weeks    Status Achieved      PT SHORT TERM GOAL #3   Title TUG test improved to 25 sec indicating improved gait speed    Baseline 16.11 seconds    Time 6    Period Weeks    Status Achieved      PT SHORT TERM GOAL #4   Title BERG balance test improved to 40/56  indicating decreased fall risk    Period Weeks    Status Achieved   42/56              PT Long Term Goals - 11/10/20 1310       PT LONG TERM GOAL #1   Title The patient will be independent with safe self progression of HEP and initiation of group ex program in community    Time 8    Period Weeks    Status Partially Met   Pt not doing any HEP currently due to pain, pt does verbally state having a plan for exercie/PT post surgery     PT LONG TERM GOAL #2   Title The patient will be able to complete a 6 minute walk test and/or 1050 feet with RW    Baseline 953 feet today    Time 8    Period Weeks    Status Unable to assess   Pt met a month ago but cannot do currently due to increasing pain when walking     PT LONG TERM GOAL #3   Title The patient will have LE strength to grossly 4/5 needed to rise from a standard chair 1x without UE assist    Time 12    Period Weeks    Status Achieved   Deferred MMT due to pain but pt can rise out of chair of a reasonable height in 1 attempt     PT LONG TERM GOAL #4   Title BERG balance score improved to 43/56 indicating decreased risk of falls    Time 8    Period Weeks    Status Unable to assess      PT LONG TERM GOAL #5   Title Timed up and Go score improved to 22 sec or less    Baseline 12.82    Time 12    Period Weeks    Status Achieved   Met goal 1 month ago, deferred today secondary to pain     PT LONG TERM GOAL #6   Title 5x sit to stand with light UE assist in 15 sec or less    Time 12    Period Weeks    Status Revised  Plan - 11/10/20 1306     Clinical Impression Statement This will be pt's last week for aquatics, as next week she will be getting all her affairs in order before hip replacement on 9/6. Pain in RT hip max level on land, affecting her ability to walk,  exercise, and perform ADLs. Pt has been able to continue exercising in the water consistently.  Pt has no pain in the water. Pt is  doing no HEP at home due to pain at this time.    Personal Factors and Comorbidities Age;Comorbidity 1;Comorbidity 2;Comorbidity 3+;Transportation;Time since onset of injury/illness/exacerbation    Comorbidities scoliosis with leg length discrepancy; bil knee/hip OA; cardiac history; HTN; reports incontinence issues related to cervical issues but states she can make adjustments in order to do pool therapy;    Examination-Participation Restrictions Cleaning;Community Activity;Shop;Meal Prep    Stability/Clinical Decision Making Evolving/Moderate complexity    Rehab Potential Good    PT Frequency 2x / week    PT Duration 8 weeks    PT Treatment/Interventions ADLs/Self Care Home Management;Aquatic Therapy;Electrical Stimulation;Cryotherapy;Moist Heat;Therapeutic activities;Therapeutic exercise;Neuromuscular re-education;Manual techniques;Patient/family education;Taping    PT Next Visit Plan Please renew Pt for today and Friday, then DC after Fridays appt.    PT Home Exercise Plan [id number]    Consulted and Agree with Plan of Care Patient             Patient will benefit from skilled therapeutic intervention in order to improve the following deficits and impairments:  Difficulty walking, Pain, Decreased balance, Decreased strength, Impaired perceived functional ability  Visit Diagnosis: Pain in left hip  Difficulty in walking, not elsewhere classified  Unsteady gait  Pain in right hip  Muscle weakness (generalized)     Problem List Patient Active Problem List   Diagnosis Date Noted   Palpitations 05/21/2020   Coronary artery disease involving native coronary artery of native heart without angina pectoris 05/21/2020   Near syncope 05/05/2020   Seasonal affective disorder ([place]) 04/22/2020   Bipolar 1 disorder, depressed ([place]) 03/01/2018   HTN (hypertension) 03/01/2018   Hypothyroid 03/01/2018   Pernicious anemia 03/01/2018   Osteoporosis 03/01/2018   Macular degeneration  03/01/2018    Hakeen Shipes, PTA 11/10/2020, 1:15 PM   [facility] [address] [place], [place], [address] Phone: [phone]   Fax:  [phone]  Name: Leilani Cespedes MRN: [id number] Date of Birth: 06/21/1945

## 2020-11-10 NOTE — Addendum Note (Signed)
Addended by: Danie Binder on: 11/10/2020 02:59 PM   Modules accepted: Orders

## 2020-11-12 ENCOUNTER — Encounter: Payer: Self-pay | Admitting: Physical Therapy

## 2020-11-12 ENCOUNTER — Ambulatory Visit: Payer: Medicare Other | Admitting: Physical Therapy

## 2020-11-12 ENCOUNTER — Other Ambulatory Visit: Payer: Self-pay

## 2020-11-12 DIAGNOSIS — M25552 Pain in left hip: Secondary | ICD-10-CM

## 2020-11-12 DIAGNOSIS — M6281 Muscle weakness (generalized): Secondary | ICD-10-CM

## 2020-11-12 DIAGNOSIS — R262 Difficulty in walking, not elsewhere classified: Secondary | ICD-10-CM | POA: Diagnosis not present

## 2020-11-12 DIAGNOSIS — R2681 Unsteadiness on feet: Secondary | ICD-10-CM | POA: Diagnosis not present

## 2020-11-12 DIAGNOSIS — M25551 Pain in right hip: Secondary | ICD-10-CM

## 2020-11-12 NOTE — Therapy (Addendum)
[facility] [address], [place], [place], [address] Phone: [phone]   Fax:  [phone]  Physical Therapy Treatment  Patient Details  Name: Jackie Jones MRN: [id number] Date of Birth: February 16, 1946 Referring Provider (PT): Windell Moulding NP   Encounter Date: 11/12/2020   PT End of Session - 11/12/20 1211     Visit Number 27    Date for PT Re-Evaluation 11/09/20    Authorization Type Medicare- KX at 15    Progress Note Due on Visit 22    PT Start Time 1211    PT Stop Time 1300    PT Time Calculation (min) 49 min    Activity Tolerance Patient tolerated treatment well    Behavior During Therapy [facility] for tasks assessed/performed             Past Medical History:  Diagnosis Date   Allergy    Arthritis    Bipolar 1 disorder, depressed ([place])    Bipolar disorder ([place])    Chicken pox    Dementia ([place])    Depression    Hashimoto's thyroiditis    History of blood transfusion    History of bone density study 2019   History of colonoscopy 2015   History of CT scan 2019   History of mammogram 2019   History of MRI    History of Papanicolaou smear of cervix    Hx: UTI (urinary tract infection)    Hypertension    Hypothyroidism    Lactose intolerance    Legally blind in left eye, as defined in [place]    Non-celiac gluten sensitivity    Osteoporosis    Pernicious anemia    Pernicious anemia    Rheumatic fever    Thyroid disease    Urinary and fecal incontinence     Past Surgical History:  Procedure Laterality Date   [place] CATH AND CORONARY ANGIOGRAPHY N/A 05/10/2020   Procedure: LEFT HEART CATH AND CORONARY ANGIOGRAPHY;  Surgeon: Nelva Bush, MD;  Location: [facility];  Service: Cardiovascular;  Laterality: N/A;   prolapsed rectum 2015     TONSILLECTOMY AND ADENOIDECTOMY  1966   TUBAL LIGATION      There were no vitals filed for  this visit.   Subjective Assessment - 11/12/20 1210     Subjective I feel a little dizzy today, I will bring my water to the side of teh pool today.    Pertinent History goes by either "[name]" or "[name]";  heart arthymia; HTN; multi joint OA especially shoulders, knees and hips; scoliosis with signifcant leg length discrepancy; Bipolar 1    Currently in Pain? Yes   9-10/10 typically during the day, 0/10 when exercising in the water   Pain Location Hip    Pain Orientation Right    Pain Descriptors / Indicators Hervey Ard                [facility] PT Assessment - 11/12/20 0001       Assessment   Medical Diagnosis unsteady gait    Referring Provider (PT) Windell Moulding NP      Strength   Overall Strength Comments RTLE grossly 3/5-4-/5 mostly secondary to hip pain limiting holding the test position              Treatment: Patient seen for aquatic therapy today.  Treatment took place in water 2.5-4 feet  deep depending upon activity.  Pt entered the pool via  stairs, step to step, increased time, close CGA. Water temp 94 degrees F.  Seated water bench with 75% submersion Pt performed seated LE AROM exercises 20x in all planes, 2# LAQ 15x Bil, UE horiaontal add/abd 2x10 with VC to relax neck. Abdominal compressions with nekadoodle 5 sec hold 2x10, underwater bicycle 2 min in the corner of bench.   Standing 75% submersion: RT hip circumduction 20x Water walking forward and backward 6x with large noodle for support and balance.   Yellow noodle used for seated decompression and PTA providing intermittent lateral sway to strtech side body.                        PT Short Term Goals - 08/09/20 2774       PT SHORT TERM GOAL #1   Title The patient will be able to walk 240 feet with RW needed for improved mobility at her new assisted living residence    Time 6    Period Weeks    Status Achieved      PT SHORT TERM GOAL #2   Title The patient will have improved LE strength  with the ability to rise from a standard chair with min UE assist    Time 6    Period Weeks    Status Achieved      PT SHORT TERM GOAL #3   Title TUG test improved to 25 sec indicating improved gait speed    Baseline 16.11 seconds    Time 6    Period Weeks    Status Achieved      PT SHORT TERM GOAL #4   Title BERG balance test improved to 40/56 indicating decreased fall risk    Period Weeks    Status Achieved   42/56              PT Long Term Goals - 11/10/20 1456       PT LONG TERM GOAL #1   Title The patient will be independent with safe self progression of HEP and initiation of group ex program in community    Status Partially Met      PT LONG TERM GOAL #2   Title The patient will be able to complete a 6 minute walk test and/or 1050 feet with RW    Status Unable to assess      PT LONG TERM GOAL #3   Title The patient will have LE strength to grossly 4/5 needed to rise from a standard chair 1x without UE assist    Status Achieved      PT LONG TERM GOAL #4   Title BERG balance score improved to 43/56 indicating decreased risk of falls    Status Unable to assess                   Plan - 11/12/20 1212     Clinical Impression Statement Today is pt's final day of aquatic PT before having Total Hip Replacement surgery on 10/23/20. Pt's ADLs are significantly limited by pain but has been able to continue some level of exercise 1-2x week in the water with PT. Goals are partially met as pain has increased over the last month or so. Some goals were previously met but would not be met at this time.    Personal Factors and Comorbidities Age;Comorbidity 1;Comorbidity 2;Comorbidity 3+;Transportation;Time since onset of injury/illness/exacerbation    Comorbidities  scoliosis with leg length discrepancy; bil knee/hip OA; cardiac history; HTN; reports incontinence issues related to cervical issues but states she can make adjustments in order to do pool therapy;     Examination-Activity Limitations Locomotion Level;Transfers;Lift;Hygiene/Grooming;Stand;Stairs;Sleep    Stability/Clinical Decision Making Evolving/Moderate complexity    Rehab Potential Good    PT Frequency 2x / week    PT Duration 8 weeks    PT Treatment/Interventions ADLs/Self Care Home Management;Aquatic Therapy;Electrical Stimulation;Cryotherapy;Moist Heat;Therapeutic activities;Therapeutic exercise;Neuromuscular re-education;Manual techniques;Patient/family education;Taping    PT Next Visit Plan DC current PT, pt to have surgery.    PT Home Exercise Plan [id number]    Consulted and Agree with Plan of Care Patient             Patient will benefit from skilled therapeutic intervention in order to improve the following deficits and impairments:  Difficulty walking, Pain, Decreased balance, Decreased strength, Impaired perceived functional ability  Visit Diagnosis: Pain in left hip  Difficulty in walking, not elsewhere classified  Unsteady gait  Pain in right hip  Muscle weakness (generalized)     Problem List Patient Active Problem List   Diagnosis Date Noted   Palpitations 05/21/2020   Coronary artery disease involving native coronary artery of native heart without angina pectoris 05/21/2020   Near syncope 05/05/2020   Seasonal affective disorder ([place]) 04/22/2020   Bipolar 1 disorder, depressed ([place]) 03/01/2018   HTN (hypertension) 03/01/2018   Hypothyroid 03/01/2018   Pernicious anemia 03/01/2018   Osteoporosis 03/01/2018   Macular degeneration 03/01/2018    Myrene Galas PTA  11/12/20 2:11 PM , 2:07 PM PHYSICAL THERAPY DISCHARGE SUMMARY  Visits from Start of Care: 27  Current functional level related to goals / functional outcomes: See above for current status.  Pt will have THA.    Remaining deficits: See above.    Education / Equipment: HEP   Patient agrees to discharge. Patient goals were partially met. Patient is being discharged due to a  change in medical status.  Sigurd Sos, PT 11/12/20 8:25 PM   [facility] [address] [place], [place], [address] Phone: [phone]   Fax:  [phone]  Name: ANALAYAH BROOKE MRN: [id number] Date of Birth: 04/28/1945

## 2020-11-12 NOTE — Pre-Procedure Instructions (Signed)
Surgical Instructions    Your procedure is scheduled on Tuesday 11/23/20.   Report to [facility] Main Entrance "A" at 10:00 A.M., then check in with the Admitting office.  Call this number if you have problems the morning of surgery:  [phone]   If you have any questions prior to your surgery date call [phone]: Open Monday-Friday 8am-4pm    Remember:  Do not eat after midnight the night before your surgery  You may drink clear liquids until 09:00 A.M. the morning of your surgery.   Clear liquids allowed are: Water, Non-Citrus Juices (without pulp), Carbonated Beverages, Clear Tea, Black Coffee ONLY (NO MILK, CREAM OR POWDERED CREAMER of any kind), and Gatorade  Patient Instructions  The night before surgery:  No food after midnight. ONLY clear liquids after midnight  The day of surgery (if you do NOT have diabetes):  Drink ONE (1) Pre-Surgery Clear Ensure by 09:00 A.M. the morning of surgery. Drink in one sitting. Do not sip.  This drink was given to you during your hospital  pre-op appointment visit.  Nothing else to drink after completing the  Pre-Surgery Clear Ensure.         If you have questions, please contact your surgeon's office.     Take these medicines the morning of surgery with A SIP OF WATER   amLODipine (NORVASC)  levothyroxine (SYNTHROID)  loratadine (CLARITIN)  venlafaxine XR (EFFEXOR-XR)    As of today, STOP taking any Aspirin (unless otherwise instructed by your surgeon) Aleve, Naproxen, Ibuprofen, Motrin, Advil, Goody's, BC's, all herbal medications, fish oil, and all vitamins.          Do not wear jewelry or makeup Do not wear lotions, powders, perfumes/colognes, or deodorant. Do not shave 48 hours prior to surgery.  Men may shave face and neck. Do not bring valuables to the hospital. DO Not wear nail polish, gel polish, artificial nails, or any other type of covering on natural nails including finger and toenails. If patients have  artificial nails, gel coating, etc. that need to be removed by a nail salon please have this removed prior to surgery or surgery may need to be canceled/delayed if the surgeon/ anesthesia feels like the patient is unable to be adequately monitored.             [facility] is not responsible for any belongings or valuables.  Do NOT Smoke (Tobacco/Vaping) or drink Alcohol 24 hours prior to your procedure If you use a CPAP at night, you may bring all equipment for your overnight stay.   Contacts, glasses, dentures or bridgework may not be worn into surgery, please bring cases for these belongings   For patients admitted to the hospital, discharge time will be determined by your treatment team.   Patients discharged the day of surgery will not be allowed to drive home, and someone needs to stay with them for 24 hours.  ONLY 1 SUPPORT PERSON MAY BE PRESENT WHILE YOU ARE IN SURGERY. IF YOU ARE TO BE ADMITTED ONCE YOU ARE IN YOUR ROOM YOU WILL BE ALLOWED TWO (2) VISITORS.  Minor children may have two parents present. Special consideration for safety and communication needs will be reviewed on a case by case basis.  Special instructions:    Oral Hygiene is also important to reduce your risk of infection.  Remember - BRUSH YOUR TEETH THE MORNING OF SURGERY WITH YOUR REGULAR TOOTHPASTE   - Preparing For Surgery  Before surgery, you can play  an important role. Because skin is not sterile, your skin needs to be as free of germs as possible. You can reduce the number of germs on your skin by washing with CHG (chlorahexidine gluconate) Soap before surgery.  CHG is an antiseptic cleaner which kills germs and bonds with the skin to continue killing germs even after washing.     Please do not use if you have an allergy to CHG or antibacterial soaps. If your skin becomes reddened/irritated stop using the CHG.  Do not shave (including legs and underarms) for at least 48 hours prior to first CHG  shower. It is OK to shave your face.  Please follow these instructions carefully.     Shower the NIGHT BEFORE SURGERY and the MORNING OF SURGERY with CHG Soap.   If you chose to wash your hair, wash your hair first as usual with your normal shampoo. After you shampoo, rinse your hair and body thoroughly to remove the shampoo.  Then [employer] and genitals (private parts) with your normal soap and rinse thoroughly to remove soap.  After that Use CHG Soap as you would any other liquid soap. You can apply CHG directly to the skin and wash gently with a scrungie or a clean washcloth.   Apply the CHG Soap to your body ONLY FROM THE NECK DOWN.  Do not use on open wounds or open sores. Avoid contact with your eyes, ears, mouth and genitals (private parts). Wash Face and genitals (private parts)  with your normal soap.   Wash thoroughly, paying special attention to the area where your surgery will be performed.  Thoroughly rinse your body with warm water from the neck down.  DO NOT shower/wash with your normal soap after using and rinsing off the CHG Soap.  Pat yourself dry with a CLEAN TOWEL.  Wear CLEAN PAJAMAS to bed the night before surgery  Place CLEAN SHEETS on your bed the night before your surgery  DO NOT SLEEP WITH PETS.   Day of Surgery:  Take a shower with CHG soap. Wear Clean/Comfortable clothing the morning of surgery Do not apply any deodorants/lotions.   Remember to brush your teeth WITH YOUR REGULAR TOOTHPASTE.   Please read over the following fact sheets that you were given.

## 2020-11-15 ENCOUNTER — Other Ambulatory Visit: Payer: Self-pay

## 2020-11-15 ENCOUNTER — Encounter (HOSPITAL_COMMUNITY)
Admission: RE | Admit: 2020-11-15 | Discharge: 2020-11-15 | Disposition: A | Discharge status: Home / Self Care | Payer: Medicare Other | Source: Ambulatory Visit | Attending: Orthopaedic Surgery | Admitting: Orthopaedic Surgery

## 2020-11-15 ENCOUNTER — Encounter (HOSPITAL_COMMUNITY): Payer: Self-pay

## 2020-11-15 DIAGNOSIS — Z01812 Encounter for preprocedural laboratory examination: Secondary | ICD-10-CM | POA: Insufficient documentation

## 2020-11-15 LAB — BASIC METABOLIC PANEL
Anion gap: 8 (ref 5–15)
BUN: 24 mg/dL — ABNORMAL HIGH (ref 8–23)
CO2: 26 mmol/L (ref 22–32)
Calcium: 9.2 mg/dL (ref 8.9–10.3)
Chloride: 97 mmol/L — ABNORMAL LOW (ref 98–111)
Creatinine, Ser: 0.88 mg/dL (ref 0.44–1.00)
GFR, Estimated: >60 mL/min (ref >60)
Glucose, Bld: 95 mg/dL (ref 70–99)
Potassium: 5.1 mmol/L (ref 3.5–5.1)
Sodium: 131 mmol/L — ABNORMAL LOW (ref 135–145)

## 2020-11-15 LAB — CBC
HCT: 36.3 % (ref 36.0–46.0)
Hemoglobin: 11.7 g/dL — ABNORMAL LOW (ref 12.0–15.0)
MCH: 29.8 pg (ref 26.0–34.0)
MCHC: 32.2 g/dL (ref 30.0–36.0)
MCV: 92.6 fL (ref 80.0–100.0)
Platelets: 316 10*3/uL (ref 150–400)
RBC: 3.92 MIL/uL (ref 3.87–5.11)
RDW: 13.2 % (ref 11.5–15.5)
WBC: 7.1 10*3/uL (ref 4.0–10.5)
nRBC: 0 % (ref 0.0–0.2)

## 2020-11-15 LAB — SURGICAL PCR SCREEN
MRSA, PCR: NEGATIVE
Staphylococcus aureus: NEGATIVE

## 2020-11-15 LAB — TYPE AND SCREEN
ABO/RH(D): O POS
Antibody Screen: NEGATIVE

## 2020-11-15 NOTE — Progress Notes (Signed)
PCP -  Windell Moulding, NP Cardiologist - Dr. Rudean Haskell  PPM/ICD - denies  Chest x-ray - 2019 per pt, while living in [place]. Pt states it was a normal XR, no abnormalities. EKG - 05/05/20 Stress Test - denies ECHO - denies Cardiac Cath - 05/10/20  Sleep Study - denies   DM: denies  Blood Thinner Instructions: n/a Aspirin Instructions: No instructions given to pt. Pt instructed to call surgeon's office for instruction about ASA.  ERAS Protcol - yes PRE-SURGERY Ensure   COVID TEST- Pt instructed to get tested at testing center on 9/2   Anesthesia review: yes, cardiac hx  Patient denies shortness of breath, fever, cough and chest pain at PAT appointment   All instructions explained to the patient, with a verbal understanding of the material. Patient agrees to go over the instructions while at home for a better understanding. The opportunity to ask questions was provided.

## 2020-11-16 NOTE — Progress Notes (Signed)
Anesthesia Chart Review:  Recent cardiology evaluation for palpitations and chest pain.  Cath 05/10/2020 showed no angiographically significant coronary artery disease, normal left ventricular contraction filling pressure.  Event monitor 06/08/2020 was benign, no malignant arrhythmias.  Preop labs reviewed, sodium mildly low 131, mild anemia with hemoglobin 11.7, otherwise unremarkable.  EKG 05/05/2020: Sinus rhythm.  Rate 95.  Nonspecific T wave inversion.  Event monitor 06/08/2020: Patient had a minimum heart rate of 65 bpm, maximum heart rate of 146 bpm, and average heart rate of 86 bpm. Predominant underlying rhythm was sinus rhythm. Two runs of supraventricular tachycardia occurred lasting 15 beats at longest with a max rate of 146 bpm at fastest. Isolated PACs were occasional (2.8%), with rare couplets and triplets present. Isolated PVCs were rare (<1.0%), with rare couplets present. No evidence of complete heart block . Triggered and diary events associated with sinus rhythm, sinus tachycardia, PACs.   No malignant arrhythmias.  Cath 05/10/2020: Conclusions: Minimal luminal irregularity in the proximal RCA.  Left main also has an acute angle without significant stenosis.  Otherwise, no angiographically significant coronary artery disease. Normal left ventricular contraction and filling pressure.   Recommendations: Medical therapy and risk factor modification to prevent progression of disease. Consider empiric therapy for microvascular dysfunction and/or vasospasm per Dr. Gasper Sells.   Jackie Jones [facility] Short Stay Center/Anesthesiology Phone [phone] 11/16/2020 1:46 PM

## 2020-11-16 NOTE — Anesthesia Preprocedure Evaluation (Addendum)
Anesthesia Evaluation  Patient identified by MRN, date of birth, ID band Patient awake    Reviewed: Allergy & Precautions, H&P , NPO status , Patient's Chart, lab work & pertinent test results  Airway Mallampati: II   Neck ROM: full    Dental   Pulmonary neg pulmonary ROS,    breath sounds clear to auscultation       Cardiovascular hypertension, + CAD   Rhythm:regular Rate:Normal     Neuro/Psych PSYCHIATRIC DISORDERS Anxiety Depression Bipolar Disorder Dementia    GI/Hepatic   Endo/Other  Hypothyroidism   Renal/GU      Musculoskeletal  (+) Arthritis ,   Abdominal   Peds  Hematology   Anesthesia Other Findings   Reproductive/Obstetrics                            Anesthesia Physical Anesthesia Plan  ASA: 3  Anesthesia Plan: Spinal and MAC   Post-op Pain Management:    Induction: Intravenous  PONV Risk Score and Plan: 2 and Propofol infusion and Treatment may vary due to age or medical condition  Airway Management Planned: Simple Face Mask  Additional Equipment:   Intra-op Plan:   Post-operative Plan:   Informed Consent: I have reviewed the patients History and Physical, chart, labs and discussed the procedure including the risks, benefits and alternatives for the proposed anesthesia with the patient or authorized representative who has indicated his/her understanding and acceptance.     Dental advisory given  Plan Discussed with: CRNA, Anesthesiologist and Surgeon  Anesthesia Plan Comments: (PAT note by Karoline Caldwell, PA-C: Recent cardiology evaluation for palpitations and chest pain.  Cath 05/10/2020 showed no angiographically significant coronary artery disease, normal left ventricular contraction filling pressure.  Event monitor 06/08/2020 was benign, no malignant arrhythmias.  Preop labs reviewed, sodium mildly low 131, mild anemia with hemoglobin 11.7, otherwise  unremarkable.  EKG 05/05/2020: Sinus rhythm.  Rate 95.  Nonspecific T wave inversion.  Event monitor 06/08/2020: . Patient had a minimum heart rate of 65 bpm, maximum heart rate of 146 bpm, and average heart rate of 86 bpm. . Predominant underlying rhythm was sinus rhythm. . Two runs of supraventricular tachycardia occurred lasting 15 beats at longest with a max rate of 146 bpm at fastest. . Isolated PACs were occasional (2.8%), with rare couplets and triplets present. . Isolated PVCs were rare (<1.0%), with rare couplets present. . No evidence of complete heart block . Marland Kitchen Triggered and diary events associated with sinus rhythm, sinus tachycardia, PACs.  No malignant arrhythmias.  Cath 05/10/2020: Conclusions: 1. Minimal luminal irregularity in the proximal RCA. Left main also has an acute angle without significant stenosis. Otherwise, no angiographically significant coronary artery disease. 2. Normal left ventricular contraction and filling pressure.  Recommendations: 1. Medical therapy and risk factor modification to prevent progression of disease. 2. Consider empiric therapy for microvascular dysfunction and/or vasospasm per Dr. Gasper Sells. )       Anesthesia Quick Evaluation

## 2020-11-20 ENCOUNTER — Encounter: Payer: Self-pay | Admitting: Orthopedic Surgery

## 2020-11-22 ENCOUNTER — Telehealth: Payer: Self-pay | Admitting: *Deleted

## 2020-11-22 NOTE — Telephone Encounter (Signed)
Ortho bundle pre-op call completed. 

## 2020-11-22 NOTE — Care Plan (Signed)
RNCM call to patient to discuss her upcoming Right total hip arthroplasty with Dr. Ninfa Linden. She is an Ortho bundle patient through [facility] and is agreeable to case management. She lives in [place] in the [place] community. She has hired a paid CG that will be assisting her at home after discharge along with her sister, who will be assisting during the day. She also requested [facility] that will provide PT there while she is at [employer]. RNCM will contact them and make sure they can provide services instead of referring to [facility]. She has a FWW and will need a 3in1/BSC. This will be ordered through [employer] to be delivered to her room prior to discharge. Answered all questions and reviewed post op care instructions. Will continue to follow from office for needs.

## 2020-11-23 ENCOUNTER — Ambulatory Visit (HOSPITAL_COMMUNITY): Payer: Medicare Other | Admitting: Certified Registered Nurse Anesthetist

## 2020-11-23 ENCOUNTER — Encounter (HOSPITAL_COMMUNITY): Payer: Self-pay | Admitting: Orthopaedic Surgery

## 2020-11-23 ENCOUNTER — Ambulatory Visit (HOSPITAL_COMMUNITY): Payer: Medicare Other

## 2020-11-23 ENCOUNTER — Other Ambulatory Visit: Payer: Self-pay

## 2020-11-23 ENCOUNTER — Inpatient Hospital Stay (HOSPITAL_COMMUNITY)
Admission: RE | Admit: 2020-11-23 | Discharge: 2020-11-25 | DRG: 470 | Disposition: A | Discharge status: Home / Self Care | Payer: Medicare Other | Attending: Orthopaedic Surgery | Admitting: Orthopaedic Surgery

## 2020-11-23 ENCOUNTER — Ambulatory Visit (HOSPITAL_COMMUNITY): Payer: Medicare Other | Admitting: Physician Assistant

## 2020-11-23 ENCOUNTER — Encounter (HOSPITAL_COMMUNITY)
Admission: RE | Disposition: A | Discharge status: Home / Self Care | Payer: Self-pay | Source: Home / Self Care | Attending: Orthopaedic Surgery

## 2020-11-23 ENCOUNTER — Observation Stay (HOSPITAL_COMMUNITY): Payer: Medicare Other

## 2020-11-23 DIAGNOSIS — Z9181 History of falling: Secondary | ICD-10-CM

## 2020-11-23 DIAGNOSIS — I2510 Atherosclerotic heart disease of native coronary artery without angina pectoris: Secondary | ICD-10-CM | POA: Diagnosis not present

## 2020-11-23 DIAGNOSIS — Z9841 Cataract extraction status, right eye: Secondary | ICD-10-CM | POA: Diagnosis not present

## 2020-11-23 DIAGNOSIS — Z888 Allergy status to other drugs, medicaments and biological substances status: Secondary | ICD-10-CM

## 2020-11-23 DIAGNOSIS — D62 Acute posthemorrhagic anemia: Secondary | ICD-10-CM | POA: Diagnosis not present

## 2020-11-23 DIAGNOSIS — H548 Legal blindness, as defined in USA: Secondary | ICD-10-CM | POA: Diagnosis not present

## 2020-11-23 DIAGNOSIS — Z9842 Cataract extraction status, left eye: Secondary | ICD-10-CM | POA: Diagnosis not present

## 2020-11-23 DIAGNOSIS — Z91041 Radiographic dye allergy status: Secondary | ICD-10-CM

## 2020-11-23 DIAGNOSIS — Z96641 Presence of right artificial hip joint: Secondary | ICD-10-CM | POA: Diagnosis not present

## 2020-11-23 DIAGNOSIS — Z8249 Family history of ischemic heart disease and other diseases of the circulatory system: Secondary | ICD-10-CM | POA: Diagnosis not present

## 2020-11-23 DIAGNOSIS — M1611 Unilateral primary osteoarthritis, right hip: Principal | ICD-10-CM | POA: Diagnosis present

## 2020-11-23 DIAGNOSIS — Z8349 Family history of other endocrine, nutritional and metabolic diseases: Secondary | ICD-10-CM

## 2020-11-23 DIAGNOSIS — F39 Unspecified mood [affective] disorder: Secondary | ICD-10-CM | POA: Diagnosis present

## 2020-11-23 DIAGNOSIS — Z8261 Family history of arthritis: Secondary | ICD-10-CM | POA: Diagnosis not present

## 2020-11-23 DIAGNOSIS — Z833 Family history of diabetes mellitus: Secondary | ICD-10-CM | POA: Diagnosis not present

## 2020-11-23 DIAGNOSIS — Z885 Allergy status to narcotic agent status: Secondary | ICD-10-CM

## 2020-11-23 DIAGNOSIS — M87851 Other osteonecrosis, right femur: Secondary | ICD-10-CM | POA: Diagnosis not present

## 2020-11-23 DIAGNOSIS — F418 Other specified anxiety disorders: Secondary | ICD-10-CM | POA: Diagnosis not present

## 2020-11-23 DIAGNOSIS — I10 Essential (primary) hypertension: Secondary | ICD-10-CM | POA: Diagnosis not present

## 2020-11-23 DIAGNOSIS — Z419 Encounter for procedure for purposes other than remedying health state, unspecified: Secondary | ICD-10-CM

## 2020-11-23 DIAGNOSIS — M879 Osteonecrosis, unspecified: Secondary | ICD-10-CM | POA: Diagnosis not present

## 2020-11-23 DIAGNOSIS — E039 Hypothyroidism, unspecified: Secondary | ICD-10-CM | POA: Diagnosis not present

## 2020-11-23 DIAGNOSIS — Z880 Allergy status to penicillin: Secondary | ICD-10-CM

## 2020-11-23 DIAGNOSIS — Z471 Aftercare following joint replacement surgery: Secondary | ICD-10-CM | POA: Diagnosis not present

## 2020-11-23 HISTORY — PX: TOTAL HIP ARTHROPLASTY: SHX124

## 2020-11-23 LAB — CBC
HCT: 25.3 % — ABNORMAL LOW (ref 36.0–46.0)
Hemoglobin: 8.4 g/dL — ABNORMAL LOW (ref 12.0–15.0)
MCH: 30.8 pg (ref 26.0–34.0)
MCHC: 33.2 g/dL (ref 30.0–36.0)
MCV: 92.7 fL (ref 80.0–100.0)
Platelets: 201 10*3/uL (ref 150–400)
RBC: 2.73 MIL/uL — ABNORMAL LOW (ref 3.87–5.11)
RDW: 13.1 % (ref 11.5–15.5)
WBC: 12.8 10*3/uL — ABNORMAL HIGH (ref 4.0–10.5)
nRBC: 0 % (ref 0.0–0.2)

## 2020-11-23 LAB — BASIC METABOLIC PANEL
Anion gap: 4 — ABNORMAL LOW (ref 5–15)
BUN: 22 mg/dL (ref 8–23)
CO2: 25 mmol/L (ref 22–32)
Calcium: 8.1 mg/dL — ABNORMAL LOW (ref 8.9–10.3)
Chloride: 103 mmol/L (ref 98–111)
Creatinine, Ser: 0.8 mg/dL (ref 0.44–1.00)
GFR, Estimated: >60 mL/min (ref >60)
Glucose, Bld: 134 mg/dL — ABNORMAL HIGH (ref 70–99)
Potassium: 4.2 mmol/L (ref 3.5–5.1)
Sodium: 132 mmol/L — ABNORMAL LOW (ref 135–145)

## 2020-11-23 LAB — TROPONIN I (HIGH SENSITIVITY): Troponin I (High Sensitivity): <2 ng/L (ref <18)

## 2020-11-23 LAB — ABO/RH: ABO/RH(D): O POS

## 2020-11-23 SURGERY — ARTHROPLASTY, HIP, TOTAL, ANTERIOR APPROACH
Anesthesia: Monitor Anesthesia Care | Site: Hip | Laterality: Right

## 2020-11-23 MED ORDER — METOCLOPRAMIDE HCL 5 MG/ML IJ SOLN: 5.0000 mg | Freq: Three times a day (TID) | INTRAMUSCULAR | Status: DC | PRN

## 2020-11-23 MED ORDER — OXYCODONE HCL 5 MG PO TABS: 5.0000 mg | ORAL_TABLET | Freq: Once | ORAL | Status: DC | PRN

## 2020-11-23 MED ORDER — ONDANSETRON HCL 4 MG/2ML IJ SOLN
INTRAMUSCULAR | Status: DC | PRN
  Administered 2020-11-23: 4 mg via INTRAVENOUS

## 2020-11-23 MED ORDER — PHENYLEPHRINE HCL (PRESSORS) 10 MG/ML IV SOLN
INTRAVENOUS | Status: DC | PRN
  Administered 2020-11-23 (×3): 80 ug via INTRAVENOUS

## 2020-11-23 MED ORDER — ORAL CARE MOUTH RINSE: 15.0000 mL | Freq: Once | OROMUCOSAL | Status: AC

## 2020-11-23 MED ORDER — 0.9 % SODIUM CHLORIDE (POUR BTL) OPTIME
TOPICAL | Status: DC | PRN
  Administered 2020-11-23: 1000 mL

## 2020-11-23 MED ORDER — SODIUM CHLORIDE 0.9 % IV SOLN: INTRAVENOUS | Status: DC

## 2020-11-23 MED ORDER — FENTANYL CITRATE (PF) 100 MCG/2ML IJ SOLN: 25.0000 ug | INTRAMUSCULAR | Status: DC | PRN

## 2020-11-23 MED ORDER — POLYETHYLENE GLYCOL 3350 17 G PO PACK: 17.0000 g | PACK | Freq: Every day | ORAL | Status: DC | PRN

## 2020-11-23 MED ORDER — IRBESARTAN 150 MG PO TABS
150.0000 mg | ORAL_TABLET | Freq: Every day | ORAL | Status: DC
  Filled 2020-11-23: qty 1

## 2020-11-23 MED ORDER — METHOCARBAMOL 500 MG PO TABS
500.0000 mg | ORAL_TABLET | Freq: Four times a day (QID) | ORAL | Status: DC | PRN
  Administered 2020-11-23 – 2020-11-25 (×5): 500 mg via ORAL
  Filled 2020-11-23 (×6): qty 1

## 2020-11-23 MED ORDER — ONDANSETRON HCL 4 MG PO TABS: 4.0000 mg | ORAL_TABLET | Freq: Four times a day (QID) | ORAL | Status: DC | PRN

## 2020-11-23 MED ORDER — DOCUSATE SODIUM 100 MG PO CAPS
100.0000 mg | ORAL_CAPSULE | Freq: Two times a day (BID) | ORAL | Status: DC
  Administered 2020-11-23 – 2020-11-25 (×4): 100 mg via ORAL
  Filled 2020-11-23 (×4): qty 1

## 2020-11-23 MED ORDER — ALUM & MAG HYDROXIDE-SIMETH 200-200-20 MG/5ML PO SUSP: 30.0000 mL | ORAL | Status: DC | PRN

## 2020-11-23 MED ORDER — FENTANYL CITRATE (PF) 250 MCG/5ML IJ SOLN
INTRAMUSCULAR | Status: DC | PRN
  Administered 2020-11-23 (×2): 50 ug via INTRAVENOUS

## 2020-11-23 MED ORDER — ACETAMINOPHEN 325 MG PO TABS
325.0000 mg | ORAL_TABLET | Freq: Four times a day (QID) | ORAL | Status: DC | PRN
  Administered 2020-11-23 – 2020-11-25 (×5): 650 mg via ORAL
  Filled 2020-11-23 (×5): qty 2

## 2020-11-23 MED ORDER — PHENYLEPHRINE HCL-NACL 20-0.9 MG/250ML-% IV SOLN
INTRAVENOUS | Status: DC | PRN
  Administered 2020-11-23: 40 ug/min via INTRAVENOUS

## 2020-11-23 MED ORDER — OXYCODONE HCL 5 MG PO TABS
5.0000 mg | ORAL_TABLET | ORAL | Status: DC | PRN
  Administered 2020-11-24 – 2020-11-25 (×8): 5 mg via ORAL
  Filled 2020-11-23 (×8): qty 1

## 2020-11-23 MED ORDER — LACTATED RINGERS IV SOLN: INTRAVENOUS | Status: DC

## 2020-11-23 MED ORDER — PROPOFOL 500 MG/50ML IV EMUL
INTRAVENOUS | Status: DC | PRN
  Administered 2020-11-23: 50 ug/kg/min via INTRAVENOUS

## 2020-11-23 MED ORDER — TRANEXAMIC ACID-NACL 1000-0.7 MG/100ML-% IV SOLN
INTRAVENOUS | Status: AC
  Filled 2020-11-23: qty 100

## 2020-11-23 MED ORDER — ASPIRIN 81 MG PO CHEW
81.0000 mg | CHEWABLE_TABLET | Freq: Two times a day (BID) | ORAL | Status: DC
  Administered 2020-11-23 – 2020-11-25 (×4): 81 mg via ORAL
  Filled 2020-11-23 (×4): qty 1

## 2020-11-23 MED ORDER — ONDANSETRON HCL 4 MG/2ML IJ SOLN: 4.0000 mg | Freq: Four times a day (QID) | INTRAMUSCULAR | Status: DC | PRN

## 2020-11-23 MED ORDER — CALCIUM CARBONATE-VITAMIN D 500-200 MG-UNIT PO TABS
2.0000 | ORAL_TABLET | Freq: Every day | ORAL | Status: DC
  Administered 2020-11-25: 2 via ORAL
  Filled 2020-11-23 (×3): qty 2

## 2020-11-23 MED ORDER — PHENOL 1.4 % MT LIQD: 1.0000 | OROMUCOSAL | Status: DC | PRN

## 2020-11-23 MED ORDER — POLYVINYL ALCOHOL 1.4 % OP SOLN
1.0000 [drp] | Freq: Two times a day (BID) | OPHTHALMIC | Status: DC | PRN
  Filled 2020-11-23: qty 15

## 2020-11-23 MED ORDER — CLINDAMYCIN PHOSPHATE 900 MG/50ML IV SOLN
900.0000 mg | INTRAVENOUS | Status: AC
  Administered 2020-11-23: 900 mg via INTRAVENOUS

## 2020-11-23 MED ORDER — CLINDAMYCIN PHOSPHATE 900 MG/50ML IV SOLN
INTRAVENOUS | Status: AC
  Filled 2020-11-23: qty 50

## 2020-11-23 MED ORDER — POVIDONE-IODINE 10 % EX SWAB: 2.0000 "application " | Freq: Once | CUTANEOUS | Status: DC

## 2020-11-23 MED ORDER — DIVALPROEX SODIUM ER 500 MG PO TB24
500.0000 mg | ORAL_TABLET | Freq: Every day | ORAL | Status: DC
  Administered 2020-11-23 – 2020-11-24 (×2): 500 mg via ORAL
  Filled 2020-11-23 (×3): qty 1

## 2020-11-23 MED ORDER — PANTOPRAZOLE SODIUM 40 MG PO TBEC
40.0000 mg | DELAYED_RELEASE_TABLET | Freq: Every day | ORAL | Status: DC
  Administered 2020-11-23 – 2020-11-25 (×3): 40 mg via ORAL
  Filled 2020-11-23 (×3): qty 1

## 2020-11-23 MED ORDER — CHLORHEXIDINE GLUCONATE 0.12 % MT SOLN
OROMUCOSAL | Status: AC
  Administered 2020-11-23: 15 mL via OROMUCOSAL
  Filled 2020-11-23: qty 15

## 2020-11-23 MED ORDER — MENTHOL 3 MG MT LOZG: 1.0000 | LOZENGE | OROMUCOSAL | Status: DC | PRN

## 2020-11-23 MED ORDER — FENTANYL CITRATE (PF) 250 MCG/5ML IJ SOLN
INTRAMUSCULAR | Status: AC
  Filled 2020-11-23: qty 5

## 2020-11-23 MED ORDER — OXYCODONE HCL 5 MG/5ML PO SOLN
5.0000 mg | Freq: Once | ORAL | Status: DC | PRN
Start: 2020-11-23 — End: 2020-11-23

## 2020-11-23 MED ORDER — LEVOTHYROXINE SODIUM 112 MCG PO TABS
112.0000 ug | ORAL_TABLET | Freq: Every day | ORAL | Status: DC
  Administered 2020-11-24 – 2020-11-25 (×2): 112 ug via ORAL
  Filled 2020-11-23 (×3): qty 1

## 2020-11-23 MED ORDER — HYDROMORPHONE HCL 1 MG/ML IJ SOLN: 0.5000 mg | INTRAMUSCULAR | Status: DC | PRN

## 2020-11-23 MED ORDER — CLINDAMYCIN PHOSPHATE 600 MG/50ML IV SOLN
600.0000 mg | Freq: Four times a day (QID) | INTRAVENOUS | Status: AC
Start: 2020-11-23 — End: 2020-11-23
  Administered 2020-11-23 (×2): 600 mg via INTRAVENOUS
  Filled 2020-11-23 (×2): qty 50

## 2020-11-23 MED ORDER — SODIUM CHLORIDE 0.9 % IR SOLN
Status: DC | PRN
  Administered 2020-11-23: 1000 mL

## 2020-11-23 MED ORDER — TRANEXAMIC ACID-NACL 1000-0.7 MG/100ML-% IV SOLN
1000.0000 mg | INTRAVENOUS | Status: AC
  Administered 2020-11-23: 1000 mg via INTRAVENOUS

## 2020-11-23 MED ORDER — VENLAFAXINE HCL ER 75 MG PO CP24
150.0000 mg | ORAL_CAPSULE | Freq: Every day | ORAL | Status: DC
  Administered 2020-11-24 – 2020-11-25 (×2): 150 mg via ORAL
  Filled 2020-11-23 (×2): qty 2

## 2020-11-23 MED ORDER — METOCLOPRAMIDE HCL 5 MG PO TABS: 5.0000 mg | ORAL_TABLET | Freq: Three times a day (TID) | ORAL | Status: DC | PRN

## 2020-11-23 MED ORDER — CHLORHEXIDINE GLUCONATE 0.12 % MT SOLN: 15.0000 mL | Freq: Once | OROMUCOSAL | Status: AC

## 2020-11-23 MED ORDER — LORATADINE 10 MG PO TABS
10.0000 mg | ORAL_TABLET | Freq: Every day | ORAL | Status: DC
  Administered 2020-11-24 – 2020-11-25 (×2): 10 mg via ORAL
  Filled 2020-11-23 (×2): qty 1

## 2020-11-23 MED ORDER — BUPIVACAINE IN DEXTROSE 0.75-8.25 % IT SOLN
INTRATHECAL | Status: DC | PRN
  Administered 2020-11-23: 1.5 mL via INTRATHECAL

## 2020-11-23 MED ORDER — AMLODIPINE BESYLATE 5 MG PO TABS
10.0000 mg | ORAL_TABLET | Freq: Every day | ORAL | Status: DC
  Filled 2020-11-23: qty 2

## 2020-11-23 MED ORDER — DIPHENHYDRAMINE HCL 12.5 MG/5ML PO ELIX
12.5000 mg | ORAL_SOLUTION | ORAL | Status: DC | PRN
  Filled 2020-11-23: qty 10

## 2020-11-23 MED ORDER — OXYCODONE HCL 5 MG PO TABS: 10.0000 mg | ORAL_TABLET | ORAL | Status: DC | PRN

## 2020-11-23 MED ORDER — METHOCARBAMOL 1000 MG/10ML IJ SOLN
500.0000 mg | Freq: Four times a day (QID) | INTRAVENOUS | Status: DC | PRN
  Filled 2020-11-23: qty 5

## 2020-11-23 SURGICAL SUPPLY — 65 items
ACETAB CUP W/GRIPTION 54 (Plate) ×2 IMPLANT
BAG COUNTER SPONGE SURGICOUNT (BAG) ×2 IMPLANT
BENZOIN TINCTURE PRP APPL 2/3 (GAUZE/BANDAGES/DRESSINGS) IMPLANT
BLADE CLIPPER SURG (BLADE) IMPLANT
BLADE SAW SGTL 18X1.27X75 (BLADE) ×2 IMPLANT
CATH FOLEY 2WAY  3CC 10FR (CATHETERS) ×1
CATH FOLEY 2WAY 3CC 10FR (CATHETERS) ×1 IMPLANT
CHLORAPREP W/TINT 26 (MISCELLANEOUS) ×2 IMPLANT
COLLAR OFFSET CORAIL SZ 11 HIP (Stem) ×1 IMPLANT
CORAIL OFFSET COLLAR SZ 11 HIP (Stem) ×2 IMPLANT
COVER SURGICAL LIGHT HANDLE (MISCELLANEOUS) ×2 IMPLANT
CUP ACET PNNCL SECTR W/GRIP 56 (Hips) ×1 IMPLANT
CUP ACETAB W/GRIPTION 54 (Plate) ×1 IMPLANT
DRAPE C-ARM 42X72 X-RAY (DRAPES) ×2 IMPLANT
DRAPE STERI IOBAN 125X83 (DRAPES) ×2 IMPLANT
DRAPE U-SHAPE 47X51 STRL (DRAPES) ×6 IMPLANT
DRESSING AQUACEL AG SP 3.5X10 (GAUZE/BANDAGES/DRESSINGS) ×1 IMPLANT
DRSG AQUACEL AG ADV 3.5X10 (GAUZE/BANDAGES/DRESSINGS) IMPLANT
DRSG AQUACEL AG SP 3.5X10 (GAUZE/BANDAGES/DRESSINGS) ×2
DURAPREP 26ML APPLICATOR (WOUND CARE) IMPLANT
ELECT BLADE 4.0 EZ CLEAN MEGAD (MISCELLANEOUS) ×2
ELECT BLADE 6.5 EXT (BLADE) IMPLANT
ELECT REM PT RETURN 9FT ADLT (ELECTROSURGICAL) ×2
ELECTRODE BLDE 4.0 EZ CLN MEGD (MISCELLANEOUS) ×1 IMPLANT
ELECTRODE REM PT RTRN 9FT ADLT (ELECTROSURGICAL) ×1 IMPLANT
FACESHIELD WRAPAROUND (MASK) ×4 IMPLANT
GLOVE SRG 8 PF TXTR STRL LF DI (GLOVE) ×2 IMPLANT
GLOVE SURG LTX SZ8 (GLOVE) ×2 IMPLANT
GLOVE SURG ORTHO LTX SZ7.5 (GLOVE) ×4 IMPLANT
GLOVE SURG UNDER POLY LF SZ8 (GLOVE) ×2
GOWN STRL REUS W/ TWL LRG LVL3 (GOWN DISPOSABLE) ×2 IMPLANT
GOWN STRL REUS W/ TWL XL LVL3 (GOWN DISPOSABLE) ×2 IMPLANT
GOWN STRL REUS W/TWL LRG LVL3 (GOWN DISPOSABLE) ×2
GOWN STRL REUS W/TWL XL LVL3 (GOWN DISPOSABLE) ×2
HANDPIECE INTERPULSE COAX TIP (DISPOSABLE) ×1
HEAD M SROM 36MM PLUS 1.5 (Hips) ×1 IMPLANT
KIT BASIN OR (CUSTOM PROCEDURE TRAY) ×2 IMPLANT
KIT TURNOVER KIT B (KITS) ×2 IMPLANT
LINER NEUTRAL 52MMX36MMX56N (Liner) ×4 IMPLANT
MANIFOLD NEPTUNE II (INSTRUMENTS) ×2 IMPLANT
NS IRRIG 1000ML POUR BTL (IV SOLUTION) ×2 IMPLANT
PACK TOTAL JOINT (CUSTOM PROCEDURE TRAY) ×2 IMPLANT
PAD ARMBOARD 7.5X6 YLW CONV (MISCELLANEOUS) ×2 IMPLANT
PINN SECTOR W/GRIP ACE CUP 56 (Hips) ×2 IMPLANT
SCREW 6.5MMX25MM (Screw) ×4 IMPLANT
SCREW PINN CAN BONE 6.5MMX15MM (Screw) ×2 IMPLANT
SET HNDPC FAN SPRY TIP SCT (DISPOSABLE) ×1 IMPLANT
SPONGE T-LAP 18X18 ~~LOC~~+RFID (SPONGE) ×4 IMPLANT
SROM M HEAD 36MM PLUS 1.5 (Hips) ×2 IMPLANT
STAPLER VISISTAT 35W (STAPLE) ×2 IMPLANT
STRIP CLOSURE SKIN 1/2X4 (GAUZE/BANDAGES/DRESSINGS) ×4 IMPLANT
SUT ETHIBOND NAB CT1 #1 30IN (SUTURE) ×2 IMPLANT
SUT MNCRL AB 4-0 PS2 18 (SUTURE) IMPLANT
SUT VIC AB 0 CT1 27 (SUTURE) ×2
SUT VIC AB 0 CT1 27XBRD ANBCTR (SUTURE) ×2 IMPLANT
SUT VIC AB 1 CT1 27 (SUTURE) ×2
SUT VIC AB 1 CT1 27XBRD ANBCTR (SUTURE) ×2 IMPLANT
SUT VIC AB 2-0 CT1 27 (SUTURE) ×2
SUT VIC AB 2-0 CT1 TAPERPNT 27 (SUTURE) ×2 IMPLANT
TOWEL GREEN STERILE (TOWEL DISPOSABLE) ×2 IMPLANT
TOWEL GREEN STERILE FF (TOWEL DISPOSABLE) ×2 IMPLANT
TRAY CATH 16FR W/PLASTIC CATH (SET/KITS/TRAYS/PACK) IMPLANT
TRAY FOLEY W/BAG SLVR 16FR (SET/KITS/TRAYS/PACK)
TRAY FOLEY W/BAG SLVR 16FR ST (SET/KITS/TRAYS/PACK) IMPLANT
WATER STERILE IRR 1000ML POUR (IV SOLUTION) ×4 IMPLANT

## 2020-11-23 NOTE — Progress Notes (Signed)
Physical Therapy Evaluation Patient Details Name: Jackie Jones MRN: [id number] DOB: 10/06/1945 Today's Date: 11/23/2020   History of Present Illness  Pt is a 75yo female presenting s/p R THA direct anterior approach on 9/6. PMH: CAD, bipolar disorder, HTN, Hashimoto's thyroiditis, dementia, macular degeneration, anemia, syncope.  Clinical Impression  Pt presents s/p the procedure above. Required min assist for bed mobility. After several seconds sitting EOB, pt became dizzy and her head started dropping to her chest. Pt was able to return to supine positioning with min assist; pt was able to perform rolling x4 in bed for bed pad adjustment and repositioning in bed. After [facility] was elevated, pt became unresponsive; RN notified and came into room. Immediately put into trendelenburg position, BP 75/43. Positioned pt flat in bed, BP rose to 114/91. Pt's sister reports she does have syncopal episodes. Further mobility deferred secondary to BP drop. Anticipate pt will progress well once BP stable. We will continue to follow her acutely to promote independence with functional mobility.     Follow Up Recommendations Follow surgeon's recommendation for DC plan and follow-up therapies    Equipment Recommendations  3in1 (PT)    Recommendations for Other Services       Precautions / Restrictions Precautions Precautions: Fall Precaution Comments: Watch BP; pt with hx of syncope Restrictions Weight Bearing Restrictions: Yes RLE Weight Bearing: Weight bearing as tolerated      Mobility  Bed Mobility Overal bed mobility: Needs Assistance Bed Mobility: Supine to Sit;Sit to Supine;Rolling Rolling: Supervision   Supine to sit: Min assist Sit to supine: Min assist   General bed mobility comments: Pt required min assist for trunk control and LE advancement off the bed. After sitting EOB for ~30s, pt reported some dizziness and her head started slowly dropping to her chest. She was able to return her  legs to bed with min assist and lay supine. Bed was lowered to flat and was able to roll with supervision for placement of bed pad. Pt was able to bridge to assist with scooting up higher in the bed. PT was increasing angle of HOB so pt could resume eating and pt became unresponsive. RN notifed and came in room. Pt was immediately placed with head flat in trendelenburg positioning, and pt became more responsive. BP taken and at 75/42, HR 56. Pt remained responsive, and placed in flat position; reports still feeling dizzy. After several minutes, BP taken and at 114/91, HR 54. Pt's bed returned to [facility] elevated and further mobility deferred secondary to BP.    Transfers                 General transfer comment: Deferred secondary to low BP  Ambulation/Gait             General Gait Details: Deferred secondary to low BP  Stairs            Wheelchair Mobility    Modified Rankin (Stroke Patients Only)       Balance Overall balance assessment: Needs assistance Sitting-balance support: Feet supported;Single extremity supported Sitting balance-Leahy Scale: Poor         Standing balance comment: unable                             Pertinent Vitals/Pain Pain Assessment: 0-10 Pain Score: 5  Pain Location: right hip Pain Descriptors / Indicators: Operative site guarding;Discomfort;Guarding;Grimacing Pain Intervention(s): Limited activity within patient's tolerance;Monitored during session;Repositioned  Home Living Family/patient expects to be discharged to:: Private residence Living Arrangements: Alone Available Help at Discharge: Family;Personal care attendant (Sister Lucilla Lame during the day, attendant at night.) Type of Home: Independent living facility Home Access: Level entry     Home Layout: One level Home Equipment: Grab bars - tub/shower;Walker - 2 wheels;Cane - single point      Prior Function Level of Independence: Independent with  assistive device(s)         Comments: Uses single point cane and RW for walking; dependent on level of pain.     Hand Dominance        Extremity/Trunk Assessment   Upper Extremity Assessment Upper Extremity Assessment: Overall WFL for tasks assessed    Lower Extremity Assessment Lower Extremity Assessment: RLE deficits/detail RLE Deficits / Details: Right hip THA, limited secondary to pain and surgical site guarding. Pt had nerve block but it was wearing off by session start.    Cervical / Trunk Assessment Cervical / Trunk Assessment: Normal  Communication   Communication: No difficulties  Cognition Arousal/Alertness: Awake/alert Behavior During Therapy: WFL for tasks assessed/performed Overall Cognitive Status: History of cognitive impairments - at baseline                                 General Comments: Hx of dementia      General Comments General comments (skin integrity, edema, etc.): Pt's sister Vaughan Basta present. She reports pt has hx of syncope    Exercises Total Joint Exercises Ankle Circles/Pumps: AROM;5 reps;Both;Supine   Assessment/Plan    PT Assessment Patient needs continued PT services  PT Problem List Decreased strength;Decreased range of motion;Decreased activity tolerance;Decreased balance;Decreased mobility;Cardiopulmonary status limiting activity;Pain       PT Treatment Interventions DME instruction;Functional mobility training;Therapeutic activities;Therapeutic exercise;Balance training;Patient/family education;Gait training    PT Goals (Current goals can be found in the Care Plan section)  Acute Rehab PT Goals Patient Stated Goal: to get back to walking and swimming PT Goal Formulation: With patient Time For Goal Achievement: 12/07/20 Potential to Achieve Goals: Good    Frequency 7X/week   Barriers to discharge        Co-evaluation               AM-PAC PT "6 Clicks" Mobility  Outcome Measure Help needed  turning from your back to your side while in a flat bed without using bedrails?: A Little Help needed moving from lying on your back to sitting on the side of a flat bed without using bedrails?: A Little Help needed moving to and from a bed to a chair (including a wheelchair)?: A Little Help needed standing up from a chair using your arms (e.g., wheelchair or bedside chair)?: A Little Help needed to walk in hospital room?: A Little Help needed climbing 3-5 steps with a railing? : A Lot 6 Click Score: 17    End of Session Equipment Utilized During Treatment: Gait belt Activity Tolerance: Treatment limited secondary to medical complications (Comment) (hypotension) Patient left: in bed;with call bell/phone within reach;with SCD's reapplied;with family/visitor present Nurse Communication: Mobility status;Other (comment) (episode of unresponsiveness) PT Visit Diagnosis: Pain;Unsteadiness on feet (R26.81);Muscle weakness (generalized) (M62.81) Pain - Right/Left: Right Pain - part of body: Hip    Time: [id number] PT Time Calculation (min) (ACUTE ONLY): 27 min   Charges:   PT Evaluation $PT Eval Moderate Complexity: 1 Mod PT Treatments $Therapeutic Activity: 8-22 mins  Dawayne Cirri, SPT Dawayne Cirri 11/23/2020, 5:23 PM

## 2020-11-23 NOTE — H&P (Signed)
TOTAL HIP ADMISSION H&P  Patient is admitted for right total hip arthroplasty.  Subjective:  Chief Complaint: right hip pain  HPI: Jackie Jones, 75 y.o. female, has a history of pain and functional disability in the right hip(s) due to arthritis and patient has failed non-surgical conservative treatments for greater than 12 weeks to include NSAID's and/or analgesics, corticosteriod injections, use of assistive devices, weight reduction as appropriate, and activity modification.  Onset of symptoms was abrupt starting 1 years ago with rapidlly worsening course since that time.The patient noted no past surgery on the right hip(s).  Patient currently rates pain in the right hip at 10 out of 10 with activity. Patient has night pain, worsening of pain with activity and weight bearing, trendelenberg gait, pain that interfers with activities of daily living, and pain with passive range of motion. Patient has evidence of subchondral cysts, subchondral sclerosis, periarticular osteophytes, and joint space narrowing by imaging studies. This condition presents safety issues increasing the risk of falls.  There is no current active infection.  Patient Active Problem List   Diagnosis Date Noted   Unilateral primary osteoarthritis, right hip 11/23/2020   Palpitations 05/21/2020   Coronary artery disease involving native coronary artery of native heart without angina pectoris 05/21/2020   Near syncope 05/05/2020   Seasonal affective disorder ([place]) 04/22/2020   Bipolar 1 disorder, depressed ([place]) 03/01/2018   HTN (hypertension) 03/01/2018   Hypothyroid 03/01/2018   Pernicious anemia 03/01/2018   Osteoporosis 03/01/2018   Macular degeneration 03/01/2018   Past Medical History:  Diagnosis Date   Allergy    Anxiety 2000   Arthritis    Bipolar 1 disorder, depressed ([place])    Bipolar disorder ([place])    Chicken pox    Dementia ([place])    Depression    Dysrhythmia 05/10/2020   pt states "cardiologist told  me it is not dangerous, it's an anomaly"   Hashimoto's thyroiditis    History of blood transfusion    History of bone density study 2019   History of colonoscopy 2015   History of CT scan 2019   History of mammogram 2019   History of MRI    History of Papanicolaou smear of cervix    Hx: UTI (urinary tract infection)    Hypertension    Hypothyroidism    Lactose intolerance    Legally blind in left eye, as defined in [place]    Non-celiac gluten sensitivity    Osteoporosis    Pernicious anemia    Pernicious anemia    Rheumatic fever    Thyroid disease    Urinary and fecal incontinence     Past Surgical History:  Procedure Laterality Date   [place]   EYE SURGERY Bilateral 2014   cataracts   LEFT HEART CATH AND CORONARY ANGIOGRAPHY N/A 05/10/2020   Procedure: LEFT HEART CATH AND CORONARY ANGIOGRAPHY;  Surgeon: Nelva Bush, MD;  Location: [facility];  Service: Cardiovascular;  Laterality: N/A;   prolapsed rectum 2015     TONSILLECTOMY AND ADENOIDECTOMY  1966   TUBAL LIGATION      Current Facility-Administered Medications  Medication Dose Route Frequency Provider Last Rate Last Admin   chlorhexidine (PERIDEX) 0.12 % solution 15 mL  15 mL Mouth/Throat Once Suzette Battiest, MD       Or   MEDLINE mouth rinse  15 mL Mouth Rinse Once Suzette Battiest, MD  chlorhexidine (PERIDEX) 0.12 % solution            clindamycin (CLEOCIN) 900 MG/50ML IVPB            clindamycin (CLEOCIN) IVPB 900 mg  900 mg Intravenous On Call to OR Pete Pelt, PA-C       lactated ringers infusion   Intravenous Continuous Suzette Battiest, MD       povidone-iodine 10 % swab 2 application  2 application Topical Once Erskine Emery W, PA-C       tranexamic acid (CYKLOKAPRON) '1000MG'$ /130m IVPB            tranexamic acid (CYKLOKAPRON) IVPB 1,000 mg  1,000 mg Intravenous To OR CPete Pelt PA-C       Allergies  Allergen Reactions    Contrast Media [Iodinated Diagnostic Agents] Itching   Trintellix [Vortioxetine] Other (See Comments)    Led to mania   Dairycare [Lactase-Lactobacillus]    Doxycycline Hyclate Nausea And Vomiting    Double vision   Soy Allergy Itching   Ciprofloxacin Rash   Codeine Rash   Levaquin [Levofloxacin] Rash   Penicillins Rash    Social History   Tobacco Use   Smoking status: Never   Smokeless tobacco: Never  Substance Use Topics   Alcohol use: Never    Family History  Problem Relation Age of Onset   Heart disease Mother    Arthritis Mother    Cancer Father        bladder   Thyroid disease Father    Dementia Father    Hypertension Sister    Hypertension Brother    Hypertension Sister    Hypertension Sister    Diabetes Brother    Diabetes type II Son      Review of Systems  Musculoskeletal:  Positive for back pain and gait problem.  All other systems reviewed and are negative.  Objective:  Physical Exam Vitals reviewed.  Constitutional:      Appearance: Normal appearance.  HENT:     Head: Normocephalic and atraumatic.  Eyes:     Extraocular Movements: Extraocular movements intact.     Pupils: Pupils are equal, round, and reactive to light.  Cardiovascular:     Rate and Rhythm: Normal rate.  Pulmonary:     Effort: Pulmonary effort is normal.  Abdominal:     Palpations: Abdomen is soft.  Musculoskeletal:     Cervical back: Normal range of motion.     Right hip: Tenderness and bony tenderness present. Decreased range of motion. Decreased strength.  Neurological:     Mental Status: She is alert and oriented to person, place, and time.  Psychiatric:        Behavior: Behavior normal.    Vital signs in last 24 hours: Temp:  [98.3 F (36.8 C)] 98.3 F (36.8 C) (09/06 1028) Pulse Rate:  [93] 93 (09/06 1028) Resp:  [18] 18 (09/06 1028) BP: (133)/(71) 133/71 (09/06 1028) SpO2:  [98 %] 98 % (09/06 1028) Weight:  [60.3 kg] 60.3 kg (09/06  1028)  Labs:   Estimated body mass index is 22.83 kg/m as calculated from the following:   Height as of this encounter: '5\' 4"'$  (1.626 m).   Weight as of this encounter: 60.3 kg.   Imaging Review Plain radiographs demonstrate severe degenerative joint disease of the right hip(s). The bone quality appears to be good for age and reported activity level.      Assessment/Plan:  End stage arthritis, right hip(s)  The patient history, physical examination, clinical judgement of the provider and imaging studies are consistent with end stage degenerative joint disease of the right hip(s) and total hip arthroplasty is deemed medically necessary. The treatment options including medical management, injection therapy, arthroscopy and arthroplasty were discussed at length. The risks and benefits of total hip arthroplasty were presented and reviewed. The risks due to aseptic loosening, infection, stiffness, dislocation/subluxation,  thromboembolic complications and other imponderables were discussed.  The patient acknowledged the explanation, agreed to proceed with the plan and consent was signed. Patient is being admitted for inpatient treatment for surgery, pain control, PT, OT, prophylactic antibiotics, VTE prophylaxis, progressive ambulation and ADL's and discharge planning.The patient is planning to be discharged home with home health services

## 2020-11-23 NOTE — Care Plan (Signed)
Ortho Bundle Case Management Note  Patient Details  Name: Jackie Jones MRN: [id number] Date of Birth: 10-06-1945  [facility] call to patient to discuss her upcoming Right total hip arthroplasty with Dr. Ninfa Linden. She is an Ortho bundle patient through [facility] and is agreeable to case management. She lives in [place] in the [place] community. She has hired a paid CG that will be assisting her at home after discharge along with her sister, who will be assisting during the day. She also requested [facility] that will provide PT there while she is at [employer]. RNCM has spoken with Jenny Reichmann there and he will be providing the HHPT/OPPT there at [facility]. [facility] liaison made aware. She has a FWW and will need a 3in1/BSC. I will ask that [facility] staff assist with providing her 3in1 prior to discharge. Answered all questions and reviewed post op care instructions. Will continue to follow from office for needs.                  DME Arranged:  3-N-1 (Patient states she has a FWW already in the home.) DME Agency:  [facility]  HH Arranged:    [place] Agency:   (She will be receiving HHPT at her [place] pre-arranged [facility]))  Additional Comments: Please contact me with any questions of if this plan should need to change.  Jamse Arn, RN, BSN, [employer]  [phone] 11/23/2020, 4:52 PM

## 2020-11-23 NOTE — Anesthesia Procedure Notes (Signed)
Spinal  Patient location during procedure: OR Start time: 11/23/2020 12:00 PM End time: 11/23/2020 12:04 PM Reason for block: surgical anesthesia Staffing Performed: anesthesiologist  Anesthesiologist: Albertha Ghee, MD Preanesthetic Checklist Completed: patient identified, IV checked, risks and benefits discussed, surgical consent, monitors and equipment checked, pre-op evaluation and timeout performed Spinal Block Patient position: sitting Prep: DuraPrep Patient monitoring: cardiac monitor, continuous pulse ox and blood pressure Approach: midline Injection technique: single-shot Needle Needle type: Pencan  Needle gauge: 24 G Needle length: 9 cm Assessment Sensory level: T10 Events: CSF return Additional Notes Functioning IV was confirmed and monitors were applied. Sterile prep and drape, including hand hygiene and sterile gloves were used. The patient was positioned and the spine was prepped. The skin was anesthetized with lidocaine.  Free flow of clear CSF was obtained prior to injecting local anesthetic into the CSF.  The spinal needle aspirated freely following injection.  The needle was carefully withdrawn.  The patient tolerated the procedure well.

## 2020-11-23 NOTE — Anesthesia Postprocedure Evaluation (Signed)
Anesthesia Post Note  Patient: Jackie Jones  Procedure(s) Performed: RIGHT TOTAL HIP ARTHROPLASTY ANTERIOR APPROACH (Right: Hip)     Patient location during evaluation: PACU Anesthesia Type: MAC Level of consciousness: oriented and awake and alert Pain management: pain level controlled Vital Signs Assessment: post-procedure vital signs reviewed and stable Respiratory status: spontaneous breathing and respiratory function stable Cardiovascular status: blood pressure returned to baseline and stable Postop Assessment: no headache, no backache, no apparent nausea or vomiting and patient able to bend at knees Anesthetic complications: no   No notable events documented.  Last Vitals:  Vitals:   11/23/20 1628 11/23/20 1933  BP: (!) 114/91 (!) 106/52  Pulse: (!) 54 73  Resp:  18  Temp:  36.8 C  SpO2: 98% 100%    Last Pain:  Vitals:   11/23/20 1935  TempSrc:   PainSc: 5                  Candra R [employer]

## 2020-11-23 NOTE — Transfer of Care (Signed)
Immediate Anesthesia Transfer of Care Note  Patient: Jackie Jones  Procedure(s) Performed: RIGHT TOTAL HIP ARTHROPLASTY ANTERIOR APPROACH (Right: Hip)  Patient Location: PACU  Anesthesia Type:MAC combined with regional for post-op pain  Level of Consciousness: drowsy and patient cooperative  Airway & Oxygen Therapy: Patient Spontanous Breathing  Post-op Assessment: Report given to RN and Post -op Vital signs reviewed and stable  Post vital signs: Reviewed and stable  Last Vitals:  Vitals Value Taken Time  BP    Temp    Pulse    Resp 17 11/23/20 1416  SpO2    Vitals shown include unvalidated device data.  Last Pain:  Vitals:   11/23/20 1109  TempSrc:   PainSc: 0-No pain         Complications: No notable events documented.

## 2020-11-23 NOTE — Op Note (Signed)
NAMEBRETT, KITCH MEDICAL RECORD NO: [id number] ACCOUNT NO: [id number] DATE OF BIRTH: [birth year] FACILITY: [facility] LOCATION: [facility] PHYSICIAN: Lind Guest. Ninfa Linden, MD  Operative Report   DATE OF PROCEDURE: 11/23/2020  PREOPERATIVE DIAGNOSIS:  Primary osteoarthritis with superimposed osteonecrosis, right hip.  POSTOPERATIVE DIAGNOSIS:  Primary osteoarthritis with superimposed osteonecrosis, right hip.  PROCEDURE:  Right total hip arthroplasty through direct anterior approach.  IMPLANTS:  DePuy sector Gription acetabular component, size 56 with two screws, size 36+0 neutral polyethylene liner, size 11 Corail femoral component with high offset, size 36+1.5 metal hip ball.  SURGEON:  Lind Guest. Ninfa Linden, MD  ASSISTANT:  Benita Stabile, PA-C  ANESTHESIA:  Spinal.  ANTIBIOTICS:  900 mg IV clindamycin.  BLOOD LOSS:  [id number] mL  COMPLICATIONS:  None.  INDICATIONS:  The patient is a 75 year old female with a significant deformity involving her right hip.  There is osteoarthritis of the hip with a touch of osteonecrosis.  The leg is significantly shortened on the right side compared to the right and  left and her hip is very deformed with some protrusio aspect of it as well.  At this point, a very conservative treatment, combined with her daily pain, decreased mobility and decreased quality of life as well as the severity of her x-ray findings she  does wish to proceed with a total hip arthroplasty on the right side.  We had a long and thorough discussion about the risk of the surgery including the risk of acute blood loss anemia, nerve or vessel injury, fracture, infection, dislocation, DVT,  implant failure and skin and soft tissue issues.  We talked about our goals being hopefully decrease pain, improve mobility and overall improve quality of life.  DESCRIPTION OF PROCEDURE:  After informed consent was obtained, appropriate right hip was marked.  She was brought to the operating room  and sat up on a stretcher where spinal anesthesia was obtained.  She was laid in supine position on the stretcher.   Foley catheter was placed and both feet, had traction boots applied to them.  Next, she was placed supine on the Hana fracture table, the perineal post in place and both legs in line with skeletal traction device and no traction applied.  We did assess  her leg lengths and she is significantly short on the right operative side.  We also obtained preoperative x-rays via C-arm to get a good judgement on how difficult this case would be.  Her right operative hip was then prepped and draped with ChloraPrep  and sterile drapes.  A timeout was called, she was identified correct patient, correct right hip.  We then made an incision just inferior and posterior to the anterior superior iliac spine and carried this slightly obliquely down the leg.  We dissected  down tensor fascia lata muscle and the tensor fascia was then divided longitudinally to proceed with direct anterior approach of the hip.  We identified and cauterized circumflex vessels then identified the hip capsule, opened the hip capsule in L-type  format finding a very large joint effusion.  We placed curved retractors within the joint capsule around the medial and lateral femoral neck and made our femoral neck cut with the oscillating saw just proximal to the lesser trochanter and completed this  with an osteotome.  We placed corkscrew guide in the femoral head and removed the femoral head in its entirety and it was very small and completely devoid of cartilage and flattened.  We then placed a bent Hohmann over  the medial acetabular rim and  removed the acetabular labrum remnants.  We assessed the acetabulum and found that she had created a kind of a pseudoacetabulum and was very deformed.  We had to be very careful with the reaming as well.  Given how medialized she was.  We had to place  each reamer under direct fluoroscopy, so we could  be gentle with it and we had to ream up to even for a small head given the difficulty of her acetabulum I had to ream up to size 53 to put a size 54 acetabular component.  However, the fit size 54  acetabular component just did not seat well and so we decided we needed to go up higher, so we removed the 54 component and we reamed 1 more increment at 55 reamer to place a final size 56 component.  It did have a good rim fit to it, but still I was  quite concerned so I placed 3 screws in the acetabular rim.  The medial screw did penetrate the inner table of the pelvis so I removed that screw.  She does have a Foley catheter in place and there was no blood noted in the urine.  We then went with a  36+0 acetabular liner for that size 56 acetabular component.  Once we guide the polyethylene liner in we again assessed it fluoroscopically and we were pleased with the positioning.  Attention was then turned to the femur with the leg externally rotated  to 120 degrees, extended and adducted we are able to place a Mueller retractor medially and a Hohmann retractor above the greater trochanter.  We released lateral joint capsule and used a box cutting osteotome to enter the femoral canal and a rongeur to  lateralize.  We then began broaching using the Corail broaching system from a size 8 going up to size 11.  With a size 11 in place we trialed standard offset femoral neck and a 36-2 hip ball.  Since she has been so short and did not have tight should be,  so we reduced this in acetabulum.  She is definitely needed more offset and leg length.  We dislocated the hip and removed the trial components.  We went with the real Corail femoral component, size 11 but a high offset component.  We went with a 36+1.5  metal hip ball and reduced this in acetabulum and we had reestablished her leg length, offset, range of motion, stability, assessed mechanically and radiographically and felt stable.  We then irrigated the soft tissue  with normal saline solution using  pulsatile lavage.  We closed the joint capsule with interrupted #1 Ethibond suture followed by #1 Vicryl to close the tensor fascia, 0 Vicryl was used to close the deep tissue and 2-0 Vicryl was used to close subcutaneous tissue.  The skin was closed  with staples.  An Aquacel dressing was applied.  She was taken off Hana table and taken to recovery room in stable condition with all final counts being correct.  No complications noted.  Of note, Benita Stabile, PA-C did assist during the entire case and his  assistance was crucial for facilitating all aspects of this case.   PUS D: 11/23/2020 2:00:47 pm T: 11/23/2020 2:43:00 pm  JOB: [id number]

## 2020-11-23 NOTE — Brief Op Note (Signed)
11/23/2020  2:03 PM  PATIENT:  Jackie Jones  75 y.o. female  PRE-OPERATIVE DIAGNOSIS:  right hip avascular necrosis and osteoarthritis  POST-OPERATIVE DIAGNOSIS:  right hip avascular necrosis and osteoarthritis  PROCEDURE:  Procedure(s): RIGHT TOTAL HIP ARTHROPLASTY ANTERIOR APPROACH (Right)  SURGEON:  Surgeon(s) and Role:    Mcarthur Rossetti, MD - Primary  PHYSICIAN ASSISTANT:  Benita Stabile, PA-C  ANESTHESIA:   spinal  EBL:  350 mL   COUNTS:  YES  PLAN OF CARE: Admit for overnight observation  PATIENT DISPOSITION:  PACU - hemodynamically stable.   Delay start of Pharmacological VTE agent (>24hrs) due to surgical blood loss or risk of bleeding: no

## 2020-11-24 ENCOUNTER — Encounter (HOSPITAL_COMMUNITY): Payer: Self-pay | Admitting: Orthopaedic Surgery

## 2020-11-24 DIAGNOSIS — Z9181 History of falling: Secondary | ICD-10-CM | POA: Diagnosis not present

## 2020-11-24 DIAGNOSIS — Z8249 Family history of ischemic heart disease and other diseases of the circulatory system: Secondary | ICD-10-CM | POA: Diagnosis not present

## 2020-11-24 DIAGNOSIS — I2510 Atherosclerotic heart disease of native coronary artery without angina pectoris: Secondary | ICD-10-CM | POA: Diagnosis present

## 2020-11-24 DIAGNOSIS — M879 Osteonecrosis, unspecified: Secondary | ICD-10-CM | POA: Diagnosis present

## 2020-11-24 DIAGNOSIS — Z8261 Family history of arthritis: Secondary | ICD-10-CM | POA: Diagnosis not present

## 2020-11-24 DIAGNOSIS — Z8349 Family history of other endocrine, nutritional and metabolic diseases: Secondary | ICD-10-CM | POA: Diagnosis not present

## 2020-11-24 DIAGNOSIS — F39 Unspecified mood [affective] disorder: Secondary | ICD-10-CM | POA: Diagnosis present

## 2020-11-24 DIAGNOSIS — H548 Legal blindness, as defined in USA: Secondary | ICD-10-CM | POA: Diagnosis present

## 2020-11-24 DIAGNOSIS — Z9841 Cataract extraction status, right eye: Secondary | ICD-10-CM | POA: Diagnosis not present

## 2020-11-24 DIAGNOSIS — Z885 Allergy status to narcotic agent status: Secondary | ICD-10-CM | POA: Diagnosis not present

## 2020-11-24 DIAGNOSIS — Z880 Allergy status to penicillin: Secondary | ICD-10-CM | POA: Diagnosis not present

## 2020-11-24 DIAGNOSIS — I10 Essential (primary) hypertension: Secondary | ICD-10-CM | POA: Diagnosis present

## 2020-11-24 DIAGNOSIS — Z9842 Cataract extraction status, left eye: Secondary | ICD-10-CM | POA: Diagnosis not present

## 2020-11-24 DIAGNOSIS — Z833 Family history of diabetes mellitus: Secondary | ICD-10-CM | POA: Diagnosis not present

## 2020-11-24 DIAGNOSIS — M1611 Unilateral primary osteoarthritis, right hip: Secondary | ICD-10-CM | POA: Diagnosis present

## 2020-11-24 DIAGNOSIS — Z91041 Radiographic dye allergy status: Secondary | ICD-10-CM | POA: Diagnosis not present

## 2020-11-24 DIAGNOSIS — Z888 Allergy status to other drugs, medicaments and biological substances status: Secondary | ICD-10-CM | POA: Diagnosis not present

## 2020-11-24 DIAGNOSIS — D62 Acute posthemorrhagic anemia: Secondary | ICD-10-CM | POA: Diagnosis not present

## 2020-11-24 LAB — BASIC METABOLIC PANEL
Anion gap: 5 (ref 5–15)
BUN: 23 mg/dL (ref 8–23)
CO2: 25 mmol/L (ref 22–32)
Calcium: 8.3 mg/dL — ABNORMAL LOW (ref 8.9–10.3)
Chloride: 101 mmol/L (ref 98–111)
Creatinine, Ser: 0.85 mg/dL (ref 0.44–1.00)
GFR, Estimated: >60 mL/min (ref >60)
Glucose, Bld: 134 mg/dL — ABNORMAL HIGH (ref 70–99)
Potassium: 5 mmol/L (ref 3.5–5.1)
Sodium: 131 mmol/L — ABNORMAL LOW (ref 135–145)

## 2020-11-24 LAB — CBC
HCT: 25.4 % — ABNORMAL LOW (ref 36.0–46.0)
Hemoglobin: 8.5 g/dL — ABNORMAL LOW (ref 12.0–15.0)
MCH: 30.6 pg (ref 26.0–34.0)
MCHC: 33.5 g/dL (ref 30.0–36.0)
MCV: 91.4 fL (ref 80.0–100.0)
Platelets: 198 10*3/uL (ref 150–400)
RBC: 2.78 MIL/uL — ABNORMAL LOW (ref 3.87–5.11)
RDW: 12.9 % (ref 11.5–15.5)
WBC: 6.4 10*3/uL (ref 4.0–10.5)
nRBC: 0 % (ref 0.0–0.2)

## 2020-11-24 MED ORDER — CHLORHEXIDINE GLUCONATE CLOTH 2 % EX PADS
6.0000 | MEDICATED_PAD | Freq: Every day | CUTANEOUS | Status: DC
  Administered 2020-11-25: 6 via TOPICAL

## 2020-11-24 NOTE — Progress Notes (Signed)
Physical Therapy Treatment Patient Details Name: Jackie Jones MRN: [id number] DOB: 1945/09/29 Today's Date: 11/24/2020    History of Present Illness Pt is a 75yo female presenting s/p R THA direct anterior approach on 9/6. PMH: CAD, bipolar disorder, HTN, Hashimoto's thyroiditis, dementia, macular degeneration, anemia, syncope.    PT Comments    Pt required min assist bed mobility, mod assist sit to stand, and min assist ambulation 5' with RW. Increased time between position changes to ensure no dizziness and stable BP. Step by step cues for sequencing and increased time to complete all mobility skills. Pt anxious regarding mobility due to pain. Session timed for 30 minutes after pain meds. Pt in recliner with feet elevated at end of session. HEP handouts provided.    Follow Up Recommendations  Follow surgeon's recommendation for DC plan and follow-up therapies     Equipment Recommendations  3in1 (PT)    Recommendations for Other Services       Precautions / Restrictions Precautions Precautions: Fall;Other (comment) Precaution Comments: Watch BP; pt with hx of syncope Restrictions RLE Weight Bearing: Weight bearing as tolerated Other Position/Activity Restrictions: direct anterior    Mobility  Bed Mobility Overal bed mobility: Needs Assistance Bed Mobility: Supine to Sit     Supine to sit: Min assist;HOB elevated     General bed mobility comments: +rail, increased time, cues for sequencing, use of bed pad to scoot toward EOB    Transfers Overall transfer level: Needs assistance Equipment used: Rolling walker (2 wheeled) Transfers: Sit to/from Stand Sit to Stand: Mod assist         General transfer comment: cues for hand placement, assist to power up, increased time  Ambulation/Gait Ambulation/Gait assistance: Min assist Gait Distance (Feet): 5 Feet Assistive device: Rolling walker (2 wheeled) Gait Pattern/deviations: Step-through pattern;Step-to  pattern;Decreased stride length;Decreased weight shift to right;Decreased stance time - right Gait velocity: decreased   General Gait Details: increased time, step by step cues for sequencing   Stairs             Wheelchair Mobility    Modified Rankin (Stroke Patients Only)       Balance Overall balance assessment: Needs assistance Sitting-balance support: Feet supported;No upper extremity supported Sitting balance-Leahy Scale: Fair     Standing balance support: Bilateral upper extremity supported;During functional activity Standing balance-Leahy Scale: Poor Standing balance comment: reliant on BUE support                            Cognition Arousal/Alertness: Awake/alert Behavior During Therapy: WFL for tasks assessed/performed Overall Cognitive Status: History of cognitive impairments - at baseline                                 General Comments: Hx of dementia. Sister present in room to confirm cognition at baseline.      Exercises Total Joint Exercises Ankle Circles/Pumps: AROM;Both;10 reps;Supine Quad Sets: AROM;Both;10 reps;Supine Gluteal Sets: AROM;Both;10 reps;Supine Heel Slides: AROM;AAROM;Right;10 reps;Supine Hip ABduction/ADduction: AAROM;Right;10 reps;Supine    General Comments General comments (skin integrity, edema, etc.): Pt's sister, Vaughan Basta, present during session.      Pertinent Vitals/Pain Pain Assessment: Faces Faces Pain Scale: Hurts little more Pain Location: R hip Pain Descriptors / Indicators: Grimacing;Discomfort;Operative site guarding Pain Intervention(s): Limited activity within patient's tolerance;Monitored during session;Premedicated before session;Repositioned;Ice applied    Home Living  Prior Function            PT Goals (current goals can now be found in the care plan section) Acute Rehab PT Goals Patient Stated Goal: to get back to walking and swimming Progress  towards PT goals: Progressing toward goals    Frequency    7X/week      PT Plan Current plan remains appropriate    Co-evaluation              AM-PAC PT "6 Clicks" Mobility   Outcome Measure  Help needed turning from your back to your side while in a flat bed without using bedrails?: A Little Help needed moving from lying on your back to sitting on the side of a flat bed without using bedrails?: A Little Help needed moving to and from a bed to a chair (including a wheelchair)?: A Little Help needed standing up from a chair using your arms (e.g., wheelchair or bedside chair)?: A Lot Help needed to walk in hospital room?: A Little Help needed climbing 3-5 steps with a railing? : A Lot 6 Click Score: 16    End of Session Equipment Utilized During Treatment: Gait belt Activity Tolerance: Patient tolerated treatment well Patient left: in chair;with call bell/phone within reach;with family/visitor present Nurse Communication: Mobility status PT Visit Diagnosis: Pain;Unsteadiness on feet (R26.81);Muscle weakness (generalized) (M62.81) Pain - Right/Left: Right Pain - part of body: Hip     Time: [id number] PT Time Calculation (min) (ACUTE ONLY): 33 min  Charges:  $Gait Training: 23-37 mins $Therapeutic Exercise: 8-22 mins                     Lorrin Goodell, PT  Office # [phone] Pager [phone]    Lorriane Shire 11/24/2020, 12:06 PM

## 2020-11-24 NOTE — Progress Notes (Signed)
Physical Therapy Treatment Patient Details Name: Jackie Jones MRN: [id number] DOB: [birth year] Today's Date: 11/24/2020    History of Present Illness Pt is a 75yo female presenting s/p R THA direct anterior approach on 9/6. PMH: CAD, bipolar disorder, HTN, Hashimoto's thyroiditis, dementia, macular degeneration, anemia, syncope.    PT Comments    Pt received in bed. BP 116/45. Declining OOB. Pt wants to coordinate mobility with pain meds. Agreeable to exercises. Pt completed LE exercises in bed. Pt remained in bed at end of session. Pt to be seen for second session following next dose of pain meds.    Follow Up Recommendations  Follow surgeon's recommendation for DC plan and follow-up therapies     Equipment Recommendations  3in1 (PT)    Recommendations for Other Services       Precautions / Restrictions Precautions Precautions: Fall;Other (comment) Precaution Comments: Watch BP; pt with hx of syncope Restrictions Weight Bearing Restrictions: Yes RLE Weight Bearing: Weight bearing as tolerated Other Position/Activity Restrictions: direct anterior    Mobility  Bed Mobility                    Transfers                    Ambulation/Gait                 Stairs             Wheelchair Mobility    Modified Rankin (Stroke Patients Only)       Balance                                            Cognition Arousal/Alertness: Awake/alert Behavior During Therapy: WFL for tasks assessed/performed Overall Cognitive Status: History of cognitive impairments - at baseline                                 General Comments: Hx of dementia. Sister present in room to confirm cognition at baseline.      Exercises Total Joint Exercises Ankle Circles/Pumps: AROM;Both;10 reps;Supine Quad Sets: AROM;Both;10 reps;Supine Gluteal Sets: AROM;Both;10 reps;Supine Heel Slides: AROM;AAROM;Right;10 reps;Supine Hip  ABduction/ADduction: AAROM;Right;10 reps;Supine    General Comments        Pertinent Vitals/Pain Pain Assessment: Faces Faces Pain Scale: Hurts little more Pain Location: R hip Pain Descriptors / Indicators: Grimacing;Discomfort;Operative site guarding Pain Intervention(s): Limited activity within patient's tolerance;Monitored during session    Home Living                      Prior Function            PT Goals (current goals can now be found in the care plan section) Acute Rehab PT Goals Patient Stated Goal: to get back to walking and swimming Progress towards PT goals: Not progressing toward goals - comment (hypotension, pain management)    Frequency    7X/week      PT Plan Current plan remains appropriate    Co-evaluation              AM-PAC PT "6 Clicks" Mobility   Outcome Measure  Help needed turning from your back to your side while in a flat bed without using bedrails?: A Little Help needed moving from lying on your  back to sitting on the side of a flat bed without using bedrails?: A Little Help needed moving to and from a bed to a chair (including a wheelchair)?: A Little Help needed standing up from a chair using your arms (e.g., wheelchair or bedside chair)?: A Little Help needed to walk in hospital room?: A Little Help needed climbing 3-5 steps with a railing? : A Lot 6 Click Score: 17    End of Session   Activity Tolerance: Treatment limited secondary to medical complications (Comment);Patient limited by pain (hypotension) Patient left: in bed;with call bell/phone within reach;with family/visitor present Nurse Communication: Mobility status PT Visit Diagnosis: Pain;Unsteadiness on feet (R26.81);Muscle weakness (generalized) (M62.81) Pain - Right/Left: Right Pain - part of body: Hip     Time: [id number] PT Time Calculation (min) (ACUTE ONLY): 22 min  Charges:  $Therapeutic Exercise: 8-22 mins                     Lorrin Goodell, PT   Office # [phone] Pager [phone]    Lorriane Shire 11/24/2020, 9:06 AM

## 2020-11-24 NOTE — Progress Notes (Signed)
Subjective: 1 Day Post-Op Procedure(s) (LRB): RIGHT TOTAL HIP ARTHROPLASTY ANTERIOR APPROACH (Right) Patient reports pain as moderate.  Did not sleep well last night.  Denies CP or SOB this am.  Episode of CP later yesterday afternoon went away quickly.  EKG and labs with no acute changes.  Does have acute blood loss anemia from surgery.  Vitals stable.  Objective: Vital signs in last 24 hours: Temp:  [97.9 F (36.6 C)-99 F (37.2 C)] 98.9 F (37.2 C) (09/07 0717) Pulse Rate:  [54-93] 85 (09/07 0717) Resp:  [10-20] 18 (09/07 0717) BP: (75-154)/(43-132) 116/45 (09/07 0717) SpO2:  [94 %-100 %] 98 % (09/07 0717) Weight:  [60.3 kg] 60.3 kg (09/06 1028)  Intake/Output from previous day: 09/06 0701 - 09/07 0700 In: 1400 [I.V.:1400] Out: 1650 [Urine:1300; Blood:350] Intake/Output this shift: No intake/output data recorded.  Recent Labs    11/23/20 1701 11/24/20 0428  HGB 8.4* 8.5*   Recent Labs    11/23/20 1701 11/24/20 0428  WBC 12.8* 6.4  RBC 2.73* 2.78*  HCT 25.3* 25.4*  PLT 201 198   Recent Labs    11/23/20 1701 11/24/20 0428  NA 132* 131*  K 4.2 5.0  CL 103 101  CO2 25 25  BUN 22 23  CREATININE 0.80 0.85  GLUCOSE 134* 134*  CALCIUM 8.1* 8.3*   No results for input(s): LABPT, INR in the last 72 hours.  Sensation intact distally Intact pulses distally Dorsiflexion/Plantar flexion intact Incision: dressing C/D/I   Assessment/Plan: 1 Day Post-Op Procedure(s) (LRB): RIGHT TOTAL HIP ARTHROPLASTY ANTERIOR APPROACH (Right) Up with therapy Will check on her again later today.  Due to limited mobility, may need to stay tonight.     Mcarthur Rossetti 11/24/2020, 8:00 AM

## 2020-11-24 NOTE — Discharge Instructions (Addendum)

## 2020-11-25 LAB — CBC
HCT: 23.7 % — ABNORMAL LOW (ref 36.0–46.0)
Hemoglobin: 7.9 g/dL — ABNORMAL LOW (ref 12.0–15.0)
MCH: 30.6 pg (ref 26.0–34.0)
MCHC: 33.3 g/dL (ref 30.0–36.0)
MCV: 91.9 fL (ref 80.0–100.0)
Platelets: 189 10*3/uL (ref 150–400)
RBC: 2.58 MIL/uL — ABNORMAL LOW (ref 3.87–5.11)
RDW: 12.9 % (ref 11.5–15.5)
WBC: 8.7 10*3/uL (ref 4.0–10.5)
nRBC: 0 % (ref 0.0–0.2)

## 2020-11-25 MED ORDER — OXYCODONE HCL 5 MG PO TABS: 5.0000 mg | ORAL_TABLET | Freq: Four times a day (QID) | ORAL | 0 refills | Status: DC | PRN

## 2020-11-25 MED ORDER — METHOCARBAMOL 500 MG PO TABS: 500.0000 mg | ORAL_TABLET | Freq: Four times a day (QID) | ORAL | 0 refills | Status: DC | PRN

## 2020-11-25 MED ORDER — ASPIRIN 81 MG PO CHEW: 81.0000 mg | CHEWABLE_TABLET | Freq: Two times a day (BID) | ORAL | 0 refills | Status: DC

## 2020-11-25 NOTE — Progress Notes (Signed)
Pt is pre arranged for [facility] through [place], per Etter Sjogren, BSN. Lurline Idol, MSW, LCSW 9/8/202211:00 AM

## 2020-11-25 NOTE — Plan of Care (Signed)
Patient alert and oriented, voiding adequately, MAE well with no difficulty. Incision area cdi with no s/s of infection. Patient discharged home per order. Patient and sister stated understanding of discharge instructions given. Patient has an appointment with Dr. Ninfa Linden

## 2020-11-25 NOTE — Progress Notes (Signed)
Patient ID: Jackie Jones, female   DOB: 10/31/1945, 75 y.o.   MRN: [id number] Looks good overall.  Right hip stable  Dressing clean and dry.  Vitals stable.  Hgb 7.9, but asymptomatic acute blood loss anemia from surgery.  Can be discharged to home today.  Has cleared therapy.

## 2020-11-25 NOTE — Discharge Summary (Signed)
Patient ID: DEMRI BRUGH MRN: [id number] DOB/AGE: 06-07-1945 75 y.o.  Admit date: 11/23/2020 Discharge date: 11/25/2020  Admission Diagnoses:  Principal Problem:   Unilateral primary osteoarthritis, right hip Active Problems:   Status post total replacement of right hip   Discharge Diagnoses:  Same  Past Medical History:  Diagnosis Date   Allergy    Anxiety 2000   Arthritis    Bipolar 1 disorder, depressed ([place])    Bipolar disorder ([place])    Chicken pox    Dementia ([place])    Depression    Dysrhythmia 05/10/2020   pt states "cardiologist told me it is not dangerous, it's an anomaly"   Hashimoto's thyroiditis    History of blood transfusion    History of bone density study 2019   History of colonoscopy 2015   History of CT scan 2019   History of mammogram 2019   History of MRI    History of Papanicolaou smear of cervix    Hx: UTI (urinary tract infection)    Hypertension    Hypothyroidism    Lactose intolerance    Legally blind in left eye, as defined in [place]    Non-celiac gluten sensitivity    Osteoporosis    Pernicious anemia    Pernicious anemia    Rheumatic fever    Thyroid disease    Urinary and fecal incontinence     Surgeries: Procedure(s): RIGHT TOTAL HIP ARTHROPLASTY ANTERIOR APPROACH on 11/23/2020   Consultants:   Discharged Condition: Improved  Hospital Course: ALAYZHA CERRITO is an 75 y.o. female who was admitted 11/23/2020 for operative treatment ofUnilateral primary osteoarthritis, right hip. Patient has severe unremitting pain that affects sleep, daily activities, and work/hobbies. After pre-op clearance the patient was taken to the operating room on 11/23/2020 and underwent  Procedure(s): RIGHT TOTAL HIP ARTHROPLASTY ANTERIOR APPROACH.    Patient was given perioperative antibiotics:  Anti-infectives (From admission, onward)    Start     Dose/Rate Route Frequency Ordered Stop   11/23/20 1615  clindamycin (CLEOCIN) IVPB 600 mg        600 mg 100  mL/hr over 30 Minutes Intravenous Every 6 hours 11/23/20 1518 11/23/20 2151   11/23/20 1045  clindamycin (CLEOCIN) IVPB 900 mg        900 mg 100 mL/hr over 30 Minutes Intravenous On call to O.R. 11/23/20 1039 11/23/20 1205   11/23/20 1045  clindamycin (CLEOCIN) 900 MG/50ML IVPB       Note to Pharmacy: Nyoka Cowden   : cabinet override      11/23/20 1045 11/23/20 1209        Patient was given sequential compression devices, early ambulation, and chemoprophylaxis to prevent DVT.  Patient benefited maximally from hospital stay and there were no complications.    Recent vital signs: Patient Vitals for the past 24 hrs:  BP Temp Temp src Pulse Resp SpO2  11/25/20 0406 (!) 115/48 99.2 F (37.3 C) Oral 95 18 96 %  11/24/20 2303 (!) 105/52 99 F (37.2 C) Oral 96 18 97 %  11/24/20 1950 (!) 128/58 99.3 F (37.4 C) Oral 98 18 98 %  11/24/20 1552 (!) 127/54 98.5 F (36.9 C) Oral 89 18 100 %     Recent laboratory studies:  Recent Labs    11/23/20 1701 11/24/20 0428 11/25/20 0512  WBC 12.8* 6.4 8.7  HGB 8.4* 8.5* 7.9*  HCT 25.3* 25.4* 23.7*  PLT 201 198 189  NA 132* 131*  --   K  4.2 5.0  --   CL 103 101  --   CO2 25 25  --   BUN 22 23  --   CREATININE 0.80 0.85  --   GLUCOSE 134* 134*  --   CALCIUM 8.1* 8.3*  --      Discharge Medications:   Allergies as of 11/25/2020       Reactions   Contrast Media [iodinated Diagnostic Agents] Itching   Trintellix [vortioxetine] Other (See Comments)   Led to mania   Dairycare [lactase-lactobacillus]    Doxycycline Hyclate Nausea And Vomiting   Double vision   Soy Allergy Itching   Ciprofloxacin Rash   Codeine Rash   Levaquin [levofloxacin] Rash   Penicillins Rash        Medication List     STOP taking these medications    aspirin EC 81 MG tablet Replaced by: aspirin 81 MG chewable tablet       TAKE these medications    acetaminophen 500 MG tablet Commonly known as: TYLENOL Take 1,000 mg by mouth in the morning  and at bedtime.   alendronate 70 MG tablet Commonly known as: FOSAMAX TAKE 1 TABLET(70 MG) BY MOUTH 1 TIME A WEEK WITH A FULL GLASS OF WATER AND ON AN EMPTY STOMACH   amLODipine 10 MG tablet Commonly known as: NORVASC TAKE 1 TABLET(10 MG) BY MOUTH DAILY   aspirin 81 MG chewable tablet Chew 1 tablet (81 mg total) by mouth 2 (two) times daily. Replaces: aspirin EC 81 MG tablet   Aveeno Restorative Skin Therap Crea Apply 1 application topically daily.   BD SafetyGlide Syringe/Needle 25G X 1" 3 ML Misc Generic drug: SYRINGE-NEEDLE (DISP) 3 ML Inject 81m in deltoid once monthly   celecoxib 50 MG capsule Commonly known as: CELEBREX Take 1 capsule (50 mg total) by mouth 2 (two) times daily.   cyanocobalamin 1000 MCG/ML injection Commonly known as: (VITAMIN B-12) INJECT 1 ML INTO THE MUSCLE EVERY 30 DAYS   divalproex 500 MG 24 hr tablet Commonly known as: DEPAKOTE ER Take 1 tablet (500 mg total) by mouth at bedtime.   halobetasol 0.05 % ointment Commonly known as: ULTRAVATE Apply 1 application topically daily as needed (eczema).   levothyroxine 112 MCG tablet Commonly known as: SYNTHROID Take 1 tablet (112 mcg total) by mouth daily.   loratadine 10 MG tablet Commonly known as: CLARITIN Take 10 mg by mouth daily.   methocarbamol 500 MG tablet Commonly known as: ROBAXIN Take 1 tablet (500 mg total) by mouth every 6 (six) hours as needed for muscle spasms.   olmesartan 20 MG tablet Commonly known as: BENICAR TAKE 1 TABLET(20 MG) BY MOUTH DAILY   oxyCODONE 5 MG immediate release tablet Commonly known as: Oxy IR/ROXICODONE Take 1-2 tablets (5-10 mg total) by mouth every 6 (six) hours as needed for moderate pain (pain score 4-6).   Polyethyl Glycol-Propyl Glycol 0.4-0.3 % Soln Place 1 drop into both eyes in the morning and at bedtime.   PreserVision AREDS Caps Take 1 capsule by mouth in the morning and at bedtime.   CENTRUM SILVER ADULT 50+ PO Take 1 tablet by mouth  daily.   venlafaxine XR 150 MG 24 hr capsule Commonly known as: EFFEXOR-XR Take 150 mg by mouth daily with breakfast.   Viactiv Calcium Plus D 650-12.5-40 MG-MCG-MCG Chew Generic drug: Calcium-Vitamin D-Vitamin K Chew 2 tablets by mouth daily.               Durable Medical Equipment  (  From admission, onward)           Start     Ordered   11/23/20 1518  DME 3 n 1  Once        11/23/20 1518   11/23/20 1518  DME Walker rolling  Once       Question Answer Comment  Walker: With 5 Inch Wheels   Patient needs a walker to treat with the following condition Status post total replacement of right hip      11/23/20 1518            Diagnostic Studies: DG Pelvis Portable  Result Date: 11/23/2020 CLINICAL DATA:  Postop EXAM: PORTABLE PELVIS 1-2 VIEWS COMPARISON:  09/09/2020 FINDINGS: Interval right hip replacement with intact hardware and normal alignment. Gas in the right hip and thigh soft tissues consistent with recent surgery. Pubic symphysis and rami are intact. Postsurgical changes in the pelvis IMPRESSION: Right hip replacement with expected postsurgical change Electronically Signed   By: Donavan Foil M.D.   On: 11/23/2020 16:19   DG C-Arm 1-60 Min-No Report  Result Date: 11/23/2020 Fluoroscopy was utilized by the requesting physician.  No radiographic interpretation.   DG HIP OPERATIVE UNILAT WITH PELVIS RIGHT  Result Date: 11/23/2020 CLINICAL DATA:  Hip arthroplasty EXAM: OPERATIVE right HIP (WITH PELVIS IF PERFORMED) 3 VIEWS TECHNIQUE: Fluoroscopic spot image(s) were submitted for interpretation post-operatively. COMPARISON:  09/09/2020 FINDINGS: Three low resolution intraoperative spot views of the right hip. Total fluoroscopy time was 1 minutes 4 seconds. The images demonstrate a right hip replacement. IMPRESSION: Intraoperative fluoroscopic assistance provided during right hip replacement Electronically Signed   By: Donavan Foil M.D.   On: 11/23/2020 16:08     Disposition: Discharge disposition: 01-Home or Self Care          Follow-up Information     Mcarthur Rossetti, MD. Go on 12/07/2020.   Specialty: Orthopedic Surgery Why: at 10:00 am for your two week post op appointment in our office. Contact information: [place] [address] [phone]         [facility] Follow up.   Specialty: [place] Why: [facility] will contact you to schedule your first home visit. Contact information: [address] [place] [address] [phone]                  Signed: Mcarthur Rossetti 11/25/2020, 11:24 AM

## 2020-11-25 NOTE — Progress Notes (Signed)
Physical Therapy Treatment Patient Details Name: Jackie Jones MRN: [id number] DOB: 04-Mar-1946 Today's Date: 11/25/2020    History of Present Illness Pt is a 75yo female presenting s/p R THA direct anterior approach on 9/6. PMH: CAD, bipolar disorder, HTN, Hashimoto's thyroiditis, dementia, macular degeneration, anemia, syncope.    PT Comments    Pt with improved activity tolerance and mobility quality this session, ambulating for increased distances and transferring multiple times. Pt is better able to tolerate weightbearing through RLE, progressing to heel strike with gait. Pt requires increased time to complete all mobility tasks but is able to do so with minimal to no physical assistance, often requiring verbal cues for reassurance. Pt will benefit from continued aggressive mobilization to improve activity tolerance and reduce falls risk.   Follow Up Recommendations  Follow surgeon's recommendation for DC plan and follow-up therapies     Equipment Recommendations  3in1 (PT)    Recommendations for Other Services       Precautions / Restrictions Precautions Precautions: Fall;Other (comment) Precaution Comments: Watch BP; pt with hx of syncope Restrictions Weight Bearing Restrictions: Yes RLE Weight Bearing: Weight bearing as tolerated Other Position/Activity Restrictions: direct anterior    Mobility  Bed Mobility Overal bed mobility: Needs Assistance Bed Mobility: Rolling;Sidelying to Sit Rolling: Supervision Sidelying to sit: Min assist       General bed mobility comments: pt utilizes hand hold to elevate trunk onto L elbow in sidelying, able to complete rest of transition to sitting with minG assist    Transfers Overall transfer level: Needs assistance Equipment used: Rolling walker (2 wheeled) Transfers: Sit to/from [facility] Sit to Stand: Min guard Stand pivot transfers: Min guard       General transfer comment: cues for hand placement and  trunk flexion. Cues to not sit until both legs touching desired destination to improve transfer safety  Ambulation/Gait Ambulation/Gait assistance: Min guard Gait Distance (Feet): 20 Feet (20', 20', 15;) Assistive device: Rolling walker (2 wheeled) Gait Pattern/deviations: Step-through pattern Gait velocity: decreased Gait velocity interpretation: <1.8 ft/sec, indicate of risk for recurrent falls General Gait Details: pt with short step-through gait, initially without heel strike of RLE and toe walking, pt progresses to initial heel strike with RLE after PT cues. Increased trunk flexion over RW throughout ambulation   Stairs             Wheelchair Mobility    Modified Rankin (Stroke Patients Only)       Balance Overall balance assessment: Needs assistance Sitting-balance support: No upper extremity supported;Feet supported Sitting balance-Leahy Scale: Fair     Standing balance support: Bilateral upper extremity supported Standing balance-Leahy Scale: Poor Standing balance comment: reliant on BUE support                            Cognition Arousal/Alertness: Awake/alert Behavior During Therapy: WFL for tasks assessed/performed Overall Cognitive Status: History of cognitive impairments - at baseline                                 General Comments: Hx of dementia. Sister present in room to confirm cognition at baseline.      Exercises      General Comments General comments (skin integrity, edema, etc.): sister present during session      Pertinent Vitals/Pain Pain Assessment: Faces Faces Pain Scale: Hurts little more Pain Location: R hip  Pain Descriptors / Indicators: Grimacing Pain Intervention(s): Monitored during session    Home Living                      Prior Function            PT Goals (current goals can now be found in the care plan section) Acute Rehab PT Goals Patient Stated Goal: to get back to walking  and swimming Progress towards PT goals: Progressing toward goals    Frequency    7X/week      PT Plan Current plan remains appropriate    Co-evaluation              AM-PAC PT "6 Clicks" Mobility   Outcome Measure  Help needed turning from your back to your side while in a flat bed without using bedrails?: A Little Help needed moving from lying on your back to sitting on the side of a flat bed without using bedrails?: A Little Help needed moving to and from a bed to a chair (including a wheelchair)?: A Little Help needed standing up from a chair using your arms (e.g., wheelchair or bedside chair)?: A Little Help needed to walk in hospital room?: A Little Help needed climbing 3-5 steps with a railing? : A Lot 6 Click Score: 17    End of Session   Activity Tolerance: Patient tolerated treatment well Patient left: in chair;with call bell/phone within reach;with family/visitor present Nurse Communication: Mobility status PT Visit Diagnosis: Pain;Unsteadiness on feet (R26.81);Muscle weakness (generalized) (M62.81) Pain - Right/Left: Right Pain - part of body: Hip     Time: [id number] PT Time Calculation (min) (ACUTE ONLY): 75 min  Charges:  $Gait Training: 38-52 mins $Therapeutic Activity: 23-37 mins                     Zenaida Niece, PT, DPT Acute Rehabilitation Pager: [phone]    Zenaida Niece 11/25/2020, 10:22 AM

## 2020-11-25 NOTE — Progress Notes (Signed)
Physical Therapy Treatment Patient Details Name: Jackie Jones MRN: [id number] DOB: 27-Sep-1945 Today's Date: 11/25/2020    History of Present Illness Pt is a 75yo female presenting s/p R THA direct anterior approach on 9/6. PMH: CAD, bipolar disorder, HTN, Hashimoto's thyroiditis, dementia, macular degeneration, anemia, syncope.    PT Comments    Pt tolerates treatment well with increased ambulation distance and reduced assistance requirements. Pt requires reduced verbal cueing for transfer and bed mobility technique. Pt continues to progress well. PT will continue to follow.   Follow Up Recommendations  Follow surgeon's recommendation for DC plan and follow-up therapies     Equipment Recommendations  3in1 (PT)    Recommendations for Other Services       Precautions / Restrictions Precautions Precautions: Fall;Other (comment) Precaution Comments: Watch BP; pt with hx of syncope Restrictions Weight Bearing Restrictions: Yes RLE Weight Bearing: Weight bearing as tolerated Other Position/Activity Restrictions: direct anterior    Mobility  Bed Mobility Overal bed mobility: Needs Assistance Bed Mobility: Rolling;Sidelying to Sit Rolling: Supervision Sidelying to sit: Supervision;HOB elevated            Transfers Overall transfer level: Needs assistance Equipment used: Rolling walker (2 wheeled) Transfers: Sit to/from Stand Sit to Stand: Supervision            Ambulation/Gait Ambulation/Gait assistance: Supervision Gait Distance (Feet): 80 Feet Assistive device: Rolling walker (2 wheeled) Gait Pattern/deviations: Step-through pattern Gait velocity: reduced Gait velocity interpretation: <1.8 ft/sec, indicate of risk for recurrent falls General Gait Details: pt with slowed step-through gait, foot flat at initial contact of RLE   Stairs             Wheelchair Mobility    Modified Rankin (Stroke Patients Only)       Balance Overall balance  assessment: Needs assistance Sitting-balance support: No upper extremity supported;Feet supported Sitting balance-Leahy Scale: Good     Standing balance support: Bilateral upper extremity supported Standing balance-Leahy Scale: Poor Standing balance comment: reliant on at least unilateral UE support of walker                            Cognition Arousal/Alertness: Awake/alert Behavior During Therapy: WFL for tasks assessed/performed Overall Cognitive Status: History of cognitive impairments - at baseline                                 General Comments: history of dementia, appropriate for this session      Exercises      General Comments General comments (skin integrity, edema, etc.): VSS on RA      Pertinent Vitals/Pain Pain Assessment: Faces Faces Pain Scale: Hurts little more Pain Location: R hip Pain Descriptors / Indicators: Grimacing Pain Intervention(s): Monitored during session    Home Living                      Prior Function            PT Goals (current goals can now be found in the care plan section) Acute Rehab PT Goals Patient Stated Goal: to get back to walking and swimming Progress towards PT goals: Progressing toward goals    Frequency    7X/week      PT Plan Current plan remains appropriate    Co-evaluation              AM-PAC  PT "6 Clicks" Mobility   Outcome Measure  Help needed turning from your back to your side while in a flat bed without using bedrails?: A Little Help needed moving from lying on your back to sitting on the side of a flat bed without using bedrails?: A Little Help needed moving to and from a bed to a chair (including a wheelchair)?: A Little Help needed standing up from a chair using your arms (e.g., wheelchair or bedside chair)?: A Little Help needed to walk in hospital room?: A Little Help needed climbing 3-5 steps with a railing? : A Lot 6 Click Score: 17    End of  Session   Activity Tolerance: Patient tolerated treatment well Patient left: Other (comment) (left on commode, RN aware) Nurse Communication: Mobility status PT Visit Diagnosis: Pain;Unsteadiness on feet (R26.81);Muscle weakness (generalized) (M62.81) Pain - Right/Left: Right Pain - part of body: Hip     Time: [id number] PT Time Calculation (min) (ACUTE ONLY): 23 min  Charges:  $Gait Training: 8-22 mins $Therapeutic Activity: 8-22 mins                     Zenaida Niece, PT, DPT Acute Rehabilitation Pager: [phone]    Zenaida Niece 11/25/2020, 2:02 PM

## 2020-11-26 ENCOUNTER — Telehealth: Payer: Self-pay | Admitting: *Deleted

## 2020-11-26 ENCOUNTER — Telehealth: Payer: Self-pay

## 2020-11-26 ENCOUNTER — Other Ambulatory Visit: Payer: Self-pay | Admitting: Orthopaedic Surgery

## 2020-11-26 MED ORDER — TIZANIDINE HCL 4 MG PO TABS: 4.0000 mg | ORAL_TABLET | Freq: Three times a day (TID) | ORAL | 0 refills | Status: DC | PRN

## 2020-11-26 NOTE — Telephone Encounter (Signed)
You send in a different muscle relaxer? Insurance won't cover the Robaxin

## 2020-11-26 NOTE — Telephone Encounter (Signed)
Transition Care Management Follow-up Telephone Call Date of discharge and from where: 11/25/2020   How have you been since you were released from the hospital? Still weak Any questions or concerns? No  Items Reviewed: Did the pt receive and understand the discharge instructions provided? Yes  Medications obtained and verified? Yes  Other? No  Any new allergies since your discharge? No  Dietary orders reviewed? Yes Do you have support at home? Yes   Home Care and Equipment/Supplies: Were home health services ordered? yes If so, what is the name of the agency? [facility]  Has the agency set up a time to come to the patient's home? no Were any new equipment or medical supplies ordered?  No What is the name of the medical supply agency? [facility] Were you able to get the supplies/equipment? not applicable Do you have any questions related to the use of the equipment or supplies? No  Functional Questionnaire: (I = Independent and D = Dependent) ADLs: D Sister helps  Bathing/Dressing- D  Meal Prep- D  Eating- I  Maintaining continence- I  Transferring/Ambulation- D  Managing Meds- D  Follow up appointments reviewed:  PCP Hospital f/u appt confirmed? Yes  Scheduled to see Dinah on 12/08/2020 @ 2:45. [facility] f/u appt confirmed? Yes  Scheduled to see Surgeon on 12/07/2020  Are transportation arrangements needed? No  If their condition worsens, is the pt aware to call PCP or go to the Emergency Dept.? Yes Was the patient provided with contact information for the PCP's office or ED? Yes Was to pt encouraged to call back with questions or concerns? Yes

## 2020-11-27 DIAGNOSIS — Z96641 Presence of right artificial hip joint: Secondary | ICD-10-CM | POA: Diagnosis not present

## 2020-11-27 DIAGNOSIS — Z471 Aftercare following joint replacement surgery: Secondary | ICD-10-CM | POA: Diagnosis not present

## 2020-11-29 DIAGNOSIS — Z96641 Presence of right artificial hip joint: Secondary | ICD-10-CM | POA: Diagnosis not present

## 2020-11-29 DIAGNOSIS — Z471 Aftercare following joint replacement surgery: Secondary | ICD-10-CM | POA: Diagnosis not present

## 2020-11-29 NOTE — Telephone Encounter (Signed)
Ortho bundle D/C call completed on 11/26/20.

## 2020-11-30 DIAGNOSIS — Z471 Aftercare following joint replacement surgery: Secondary | ICD-10-CM | POA: Diagnosis not present

## 2020-11-30 DIAGNOSIS — Z96641 Presence of right artificial hip joint: Secondary | ICD-10-CM | POA: Diagnosis not present

## 2020-12-03 ENCOUNTER — Telehealth: Payer: Self-pay | Admitting: *Deleted

## 2020-12-03 ENCOUNTER — Other Ambulatory Visit: Payer: Self-pay | Admitting: Orthopaedic Surgery

## 2020-12-03 ENCOUNTER — Other Ambulatory Visit: Payer: Self-pay | Admitting: *Deleted

## 2020-12-03 MED ORDER — METHOCARBAMOL 500 MG PO TABS: 500.0000 mg | ORAL_TABLET | Freq: Four times a day (QID) | ORAL | 1 refills | Status: DC | PRN

## 2020-12-03 NOTE — Progress Notes (Signed)
Error

## 2020-12-03 NOTE — Telephone Encounter (Signed)
Patient called requesting refill of Robaxin specifically. She knows you changed it because of insurance, but she paid out of pocket (very cheap) and wants to continue with this since it's working very well for her. Never tried the Tizanidine and doesn't want to at this point. Also she asked if she can resume her weekly Fosamax this Sunday as scheduled? Thanks.

## 2020-12-07 ENCOUNTER — Ambulatory Visit (HOSPITAL_COMMUNITY)
Admission: RE | Admit: 2020-12-07 | Discharge: 2020-12-07 | Disposition: A | Discharge status: Home / Self Care | Payer: Medicare Other | Source: Ambulatory Visit | Attending: Orthopaedic Surgery | Admitting: Orthopaedic Surgery

## 2020-12-07 ENCOUNTER — Telehealth: Payer: Self-pay

## 2020-12-07 ENCOUNTER — Encounter: Payer: Self-pay | Admitting: Orthopaedic Surgery

## 2020-12-07 ENCOUNTER — Other Ambulatory Visit: Payer: Self-pay

## 2020-12-07 ENCOUNTER — Ambulatory Visit (INDEPENDENT_AMBULATORY_CARE_PROVIDER_SITE_OTHER): Payer: Medicare Other | Admitting: Orthopaedic Surgery

## 2020-12-07 ENCOUNTER — Other Ambulatory Visit: Payer: Self-pay | Admitting: Physician Assistant

## 2020-12-07 DIAGNOSIS — Z96641 Presence of right artificial hip joint: Secondary | ICD-10-CM

## 2020-12-07 DIAGNOSIS — M79604 Pain in right leg: Secondary | ICD-10-CM

## 2020-12-07 DIAGNOSIS — Z471 Aftercare following joint replacement surgery: Secondary | ICD-10-CM | POA: Diagnosis not present

## 2020-12-07 NOTE — Telephone Encounter (Signed)
Doppler was negative

## 2020-12-07 NOTE — Progress Notes (Signed)
HPI: Mrs. Herst returns today status post right total hip arthroplasty done on 622.  She states she is overall doing well she is been on aspirin 81 mg twice daily.  She does note she is having some swelling in both legs but predominantly in the right leg and is having some pain in the right calf.  Otherwise she feels she is progressing well with physical therapy.  She is only taking Robaxin for pain.  Negative for fevers chills.  Physical exam: Right hip surgical incisions healing well well approximated with staples no wound dehiscence.  No signs of infection.  Right calf tenderness positive edema slightly more edematous right lower leg compared to left.  Dorsiflexion plantarflexion bilateral ankles intact.  She is able to ambulate on her own using a rolling walker.  Impression: Status post right total hip arthroplasty Right calf pain and swelling  Plan: We will obtain a Doppler of her right lower leg to rule out DVT.  Should go back on her 81 mg aspirin daily for now.  She understands we will call her if she needs to go on anticoagulant due to DVT.  Staples removed Steri-Strips applied.  Scar tissue mobilization encouraged.  She will continue work therapy on gait balance and transfers.  Questions were encouraged and answered at length.  Follow-up in 1 month sooner if there is any questions or concerns.

## 2020-12-07 NOTE — Progress Notes (Signed)
RLE venous duplex has been completed.  Preliminary results given to [facility] at the office of Erskine Emery, [place].   Results can be found under chart review under CV PROC. 12/07/2020 1:37 PM Clarabel Marion RVT, RDMS

## 2020-12-08 ENCOUNTER — Ambulatory Visit (INDEPENDENT_AMBULATORY_CARE_PROVIDER_SITE_OTHER): Payer: Medicare Other | Admitting: Family

## 2020-12-08 ENCOUNTER — Encounter: Payer: Self-pay | Admitting: Family

## 2020-12-08 VITALS — BP 150/70 | HR 100 | Temp 96.2°F | Ht 64.0 in | Wt 149.2 lb

## 2020-12-08 DIAGNOSIS — R2681 Unsteadiness on feet: Secondary | ICD-10-CM

## 2020-12-08 DIAGNOSIS — I10 Essential (primary) hypertension: Secondary | ICD-10-CM

## 2020-12-08 DIAGNOSIS — D62 Acute posthemorrhagic anemia: Secondary | ICD-10-CM

## 2020-12-08 DIAGNOSIS — E038 Other specified hypothyroidism: Secondary | ICD-10-CM

## 2020-12-08 DIAGNOSIS — M810 Age-related osteoporosis without current pathological fracture: Secondary | ICD-10-CM

## 2020-12-08 DIAGNOSIS — E063 Autoimmune thyroiditis: Secondary | ICD-10-CM | POA: Diagnosis not present

## 2020-12-08 DIAGNOSIS — Z96641 Presence of right artificial hip joint: Secondary | ICD-10-CM

## 2020-12-08 DIAGNOSIS — Z23 Encounter for immunization: Secondary | ICD-10-CM

## 2020-12-08 DIAGNOSIS — F319 Bipolar disorder, unspecified: Secondary | ICD-10-CM | POA: Diagnosis not present

## 2020-12-08 NOTE — Progress Notes (Signed)
Provider: Mireille Lacombe FNP-C  Jackie Alanis, NP  Patient Care Team: Jackie Alanis, NP as PCP - General (Adult Health Nurse Practitioner)  Extended Emergency Contact Information Primary Emergency Contact: Roth,Linda Address: [address]          [place], [place] [address] [place] Phone: [phone] Mobile Phone: [phone] Relation: Sister  Code Status:  DNR Goals of care: Advanced Directive information Advanced Directives 12/08/2020  Does Patient Have a Medical Advance Directive? Yes  Type of Paramedic of [place];Living will;Out of facility DNR (pink MOST or yellow form)  Does patient want to make changes to medical advance directive? -  Copy of [place] in Chart? Yes - validated most recent copy scanned in chart (See row information)  Pre-existing out of facility DNR order (yellow form or pink MOST form) Yellow form placed in chart (order not valid for inpatient use)     Chief Complaint  Patient presents with   Transitions Of Care    Patient discharged from hospital 11/25/2020. Patient had hip replacement. Patient is feeling great. Patient has concern about medications.Patient would like Flu vaccine. Legs and ankles very swollen.   Health Maintenance    Hep C screening,Zoster, dexa scan, flu vaccine, 3rd COVID booster    HPI:  Pt is a 75 y.o. female seen today for Transition of care for post hospital admission from 11/23/2020 - 11/25/2020 for right total hip arthroplasty anterior approach done by Dr.Christopher Ninfa Linden.Hgb 8.4 > 8.5> 7.9.she had a follow up with Orthopedic yesterday 12/07/2020 right hip staples were removed and steri-strips applied.Also had right leg ultrasound to rule out DVT due to leg edema. States Ultrasound was negative for DVT was advised to wear bilateral knee high Ted hose. She denies any calf tenderness,shortness of breath,chest tightness,palpitation or chest pain.  Medication reviewed  and reconciled. States right hip incision pain under controlled.   Past Medical History:  Diagnosis Date   Allergy    Anxiety 2000   Arthritis    Bipolar 1 disorder, depressed ([place])    Bipolar disorder ([place])    Chicken pox    Dementia ([place])    Depression    Dysrhythmia 05/10/2020   pt states "cardiologist told me it is not dangerous, it's an anomaly"   Hashimoto's thyroiditis    History of blood transfusion    History of bone density study 2019   History of colonoscopy 2015   History of CT scan 2019   History of mammogram 2019   History of MRI    History of Papanicolaou smear of cervix    History of right hip replacement    Hx: UTI (urinary tract infection)    Hypertension    Hypothyroidism    Lactose intolerance    Legally blind in left eye, as defined in [place]    Non-celiac gluten sensitivity    Osteoporosis    Pernicious anemia    Pernicious anemia    Rheumatic fever    Thyroid disease    Urinary and fecal incontinence    Past Surgical History:  Procedure Laterality Date   [place]   EYE SURGERY Bilateral 2014   cataracts   LEFT HEART CATH AND CORONARY ANGIOGRAPHY N/A 05/10/2020   Procedure: LEFT HEART CATH AND CORONARY ANGIOGRAPHY;  Surgeon: Nelva Bush, MD;  Location: [facility];  Service: Cardiovascular;  Laterality: N/A;   prolapsed rectum  2015     TONSILLECTOMY AND ADENOIDECTOMY  1966   TOTAL HIP ARTHROPLASTY Right 11/23/2020   Procedure: RIGHT TOTAL HIP ARTHROPLASTY ANTERIOR APPROACH;  Surgeon: Mcarthur Rossetti, MD;  Location: [facility];  Service: Orthopedics;  Laterality: Right;   TUBAL LIGATION      Allergies  Allergen Reactions   Contrast Media [Iodinated Diagnostic Agents] Itching   Trintellix [Vortioxetine] Other (See Comments)    Led to mania   Dairycare [Lactase-Lactobacillus]    Doxycycline Hyclate Nausea And Vomiting    Double vision   Soy Allergy Itching   Ciprofloxacin Rash    Levaquin [Levofloxacin] Rash   Penicillins Rash    Outpatient Encounter Medications as of 12/08/2020  Medication Sig   acetaminophen (TYLENOL) 500 MG tablet Take 1,000 mg by mouth in the morning and at bedtime.   alendronate (FOSAMAX) 70 MG tablet TAKE 1 TABLET(70 MG) BY MOUTH 1 TIME A WEEK WITH A FULL GLASS OF WATER AND ON AN EMPTY STOMACH   amLODipine (NORVASC) 10 MG tablet TAKE 1 TABLET(10 MG) BY MOUTH DAILY   aspirin 81 MG chewable tablet Chew 1 tablet (81 mg total) by mouth 2 (two) times daily.   Calcium-Vitamin D-Vitamin K (VIACTIV CALCIUM PLUS D) 650-12.5-40 MG-MCG-MCG CHEW Chew 2 tablets by mouth daily.   celecoxib (CELEBREX) 50 MG capsule Take 1 capsule (50 mg total) by mouth 2 (two) times daily.   cyanocobalamin (,VITAMIN B-12,) 1000 MCG/ML injection INJECT 1 ML INTO THE MUSCLE EVERY 30 DAYS   divalproex (DEPAKOTE ER) 500 MG 24 hr tablet Take 1 tablet (500 mg total) by mouth at bedtime.   Emollient (AVEENO RESTORATIVE SKIN THERAP) CREA Apply 1 application topically daily.   halobetasol (ULTRAVATE) 0.05 % ointment Apply 1 application topically daily as needed (eczema).   levothyroxine (SYNTHROID) 112 MCG tablet Take 1 tablet (112 mcg total) by mouth daily.   loratadine (CLARITIN) 10 MG tablet Take 10 mg by mouth daily.   methocarbamol (ROBAXIN) 500 MG tablet Take 1 tablet (500 mg total) by mouth every 6 (six) hours as needed for muscle spasms.   Multiple Vitamins-Minerals (CENTRUM SILVER ADULT 50+ PO) Take 1 tablet by mouth daily.   Multiple Vitamins-Minerals (PRESERVISION AREDS) CAPS Take 1 capsule by mouth in the morning and at bedtime.   olmesartan (BENICAR) 20 MG tablet TAKE 1 TABLET(20 MG) BY MOUTH DAILY   Polyethyl Glycol-Propyl Glycol 0.4-0.3 % SOLN Place 1 drop into both eyes in the morning and at bedtime.   SYRINGE-NEEDLE, DISP, 3 ML (BD SAFETYGLIDE SYRINGE/NEEDLE) 25G X 1" 3 ML MISC Inject 71m in deltoid once monthly   venlafaxine XR (EFFEXOR-XR) 150 MG 24 hr capsule Take  150 mg by mouth daily with breakfast.   [DISCONTINUED] oxyCODONE (OXY IR/ROXICODONE) 5 MG immediate release tablet Take 1-2 tablets (5-10 mg total) by mouth every 6 (six) hours as needed for moderate pain (pain score 4-6).   [DISCONTINUED] tiZANidine (ZANAFLEX) 4 MG tablet Take 1 tablet (4 mg total) by mouth every 8 (eight) hours as needed for muscle spasms.   No facility-administered encounter medications on file as of 12/08/2020.    Review of Systems  Constitutional:  Negative for appetite change, chills, fatigue, fever and unexpected weight change.  HENT:  Negative for congestion, dental problem, ear discharge, ear pain, facial swelling, hearing loss, nosebleeds, postnasal drip, rhinorrhea, sinus pressure, sinus pain, sneezing, sore throat, tinnitus and trouble swallowing.   Eyes:  Negative for pain, discharge, redness, itching and visual disturbance.  Respiratory:  Negative  for cough, chest tightness, shortness of breath and wheezing.   Cardiovascular:  Positive for leg swelling. Negative for chest pain and palpitations.  Gastrointestinal:  Negative for abdominal distention, abdominal pain, blood in stool, constipation, diarrhea, nausea and vomiting.  Endocrine: Negative for cold intolerance, heat intolerance, polydipsia, polyphagia and polyuria.  Genitourinary:  Negative for difficulty urinating, dysuria, flank pain, frequency and urgency.  Musculoskeletal:  Positive for arthralgias. Negative for back pain, gait problem, joint swelling, myalgias, neck pain and neck stiffness.       Status post right hip replacement   Skin:  Negative for color change, pallor, rash and wound.  Neurological:  Negative for dizziness, syncope, speech difficulty, weakness, light-headedness, numbness and headaches.  Hematological:  Does not bruise/bleed easily.  Psychiatric/Behavioral:  Negative for agitation, behavioral problems, confusion, hallucinations, self-injury, sleep disturbance and suicidal ideas. The  patient is not nervous/anxious.    Immunization History  Administered Date(s) Administered   Influenza, High Dose Seasonal PF 11/11/2018, 11/30/2019   Moderna SARS-COV2 Booster Vaccination 06/24/2020   PFIZER(Purple Top)SARS-COV-2 Vaccination 04/21/2019, 05/12/2019, 03/01/2020   Tdap 11/11/2018   Pertinent  Health Maintenance Due  Topic Date Due   DEXA SCAN  Never done   INFLUENZA VACCINE  10/18/2020   Fall Risk  12/08/2020 05/28/2020 04/15/2020 10/07/2019 10/30/2018  Falls in the past year? 0 0 0 0 0  Number falls in past yr: 0 0 0 0 0  Injury with Fall? 0 0 0 0 0  Risk for fall due to : No Fall Risks - - Impaired vision;Impaired balance/gait;Medication side effect -  Follow up Falls evaluation completed - - Falls evaluation completed;Falls prevention discussed -   Functional Status Survey:    Vitals:   12/08/20 1509  BP: (!) 150/70  Pulse: 100  Temp: (!) 96.2 F (35.7 C)  SpO2: 98%  Weight: 149 lb 3.2 oz (67.7 kg)  Height: _0  (1.626 m)   Body mass index is 25.61 kg/m. Physical Exam Vitals reviewed.  Constitutional:      General: She is not in acute distress.    Appearance: Normal appearance. She is normal weight. She is not ill-appearing or diaphoretic.  HENT:     Head: Normocephalic.     Right Ear: Tympanic membrane, ear canal and external ear normal. There is no impacted cerumen.     Left Ear: Tympanic membrane, ear canal and external ear normal. There is no impacted cerumen.     Nose: Nose normal. No congestion or rhinorrhea.     Mouth/Throat:     Mouth: Mucous membranes are moist.     Pharynx: Oropharynx is clear. No oropharyngeal exudate or posterior oropharyngeal erythema.  Eyes:     General: No scleral icterus.       Right eye: No discharge.        Left eye: No discharge.     Extraocular Movements: Extraocular movements intact.     Conjunctiva/sclera: Conjunctivae normal.     Pupils: Pupils are equal, round, and reactive to light.  Neck:     Vascular:  No carotid bruit.  Cardiovascular:     Rate and Rhythm: Normal rate and regular rhythm.     Pulses: Normal pulses.     Heart sounds: Normal heart sounds. No murmur heard.   No friction rub. No gallop.  Pulmonary:     Effort: Pulmonary effort is normal. No respiratory distress.     Breath sounds: Normal breath sounds. No wheezing, rhonchi or rales.  Chest:  Chest wall: No tenderness.  Abdominal:     General: Bowel sounds are normal. There is no distension.     Palpations: Abdomen is soft. There is no mass.     Tenderness: There is no abdominal tenderness. There is no right CVA tenderness, left CVA tenderness, guarding or rebound.  Musculoskeletal:        General: No swelling or tenderness. Normal range of motion.     Cervical back: Normal range of motion. No rigidity or tenderness.     Right lower leg: Edema present.     Left lower leg: Edema present.     Comments: Ambulates with Rolling walker  Bilateral knee high compression stockings in place   Lymphadenopathy:     Cervical: No cervical adenopathy.  Skin:    General: Skin is warm and dry.     Coloration: Skin is not pale.     Findings: No bruising, erythema, lesion or rash.     Comments: Right hip surgical incision steri-strips intact surrounding skin without any erythema,tenderness or drainage.   Neurological:     Mental Status: She is alert and oriented to person, place, and time.     Cranial Nerves: No cranial nerve deficit.     Sensory: No sensory deficit.     Motor: No weakness.     Coordination: Coordination normal.     Gait: Gait normal.  Psychiatric:        Mood and Affect: Mood normal.        Speech: Speech normal.        Behavior: Behavior normal.        Thought Content: Thought content normal.        Judgment: Judgment normal.    Labs reviewed: Recent Labs    11/15/20 1013 11/23/20 1701 11/24/20 0428  NA 131* 132* 131*  K 5.1 4.2 5.0  CL 97* 103 101  CO2 _0 GLUCOSE 95 134* 134*  BUN 24* 22  23  CREATININE 0.88 0.80 0.85  CALCIUM 9.2 8.1* 8.3*   Recent Labs    08/31/20 1106  AST 20  ALT 14  BILITOT 0.3  PROT 6.3   Recent Labs    08/31/20 1106 11/15/20 1013 11/23/20 1701 11/24/20 0428 11/25/20 0512  WBC 7.4   < > 12.8* 6.4 8.7  NEUTROABS 4,884  --   --   --   --   HGB 10.6*   < > 8.4* 8.5* 7.9*  HCT 31.4*   < > 25.3* 25.4* 23.7*  MCV 88.2   < > 92.7 91.4 91.9  PLT 283   < > 201 198 189   < > = values in this interval not displayed.   Lab Results  Component Value Date   TSH 0.08 (L) 08/31/2020   No results found for: HGBA1C Lab Results  Component Value Date   CHOL 165 09/25/2018   HDL 74.50 09/25/2018   LDLCALC 79 09/25/2018   TRIG 56.0 09/25/2018   CHOLHDL 2 09/25/2018    Significant Diagnostic Results in last 30 days:  DG Pelvis Portable  Result Date: 11/23/2020 CLINICAL DATA:  Postop EXAM: PORTABLE PELVIS 1-2 VIEWS COMPARISON:  09/09/2020 FINDINGS: Interval right hip replacement with intact hardware and normal alignment. Gas in the right hip and thigh soft tissues consistent with recent surgery. Pubic symphysis and rami are intact. Postsurgical changes in the pelvis IMPRESSION: Right hip replacement with expected postsurgical change Electronically Signed   By: Madie Reno.D.  On: 11/23/2020 16:19   DG C-Arm 1-60 Min-No Report  Result Date: 11/23/2020 Fluoroscopy was utilized by the requesting physician.  No radiographic interpretation.   DG HIP OPERATIVE UNILAT WITH PELVIS RIGHT  Result Date: 11/23/2020 CLINICAL DATA:  Hip arthroplasty EXAM: OPERATIVE right HIP (WITH PELVIS IF PERFORMED) 3 VIEWS TECHNIQUE: Fluoroscopic spot image(s) were submitted for interpretation post-operatively. COMPARISON:  09/09/2020 FINDINGS: Three low resolution intraoperative spot views of the right hip. Total fluoroscopy time was 1 minutes 4 seconds. The images demonstrate a right hip replacement. IMPRESSION: Intraoperative fluoroscopic assistance provided during right  hip replacement Electronically Signed   By: Donavan Foil M.D.   On: 11/23/2020 16:08   VAS [place] LOWER EXTREMITY VENOUS (DVT)  Result Date: 12/07/2020  Lower Venous DVT Study Patient Name:  Jackie Jones  Date of Exam:   12/07/2020 Medical Rec #: [id number]        Accession #:    [id number] Date of Birth: 1946-03-05        Patient Gender: F Patient Age:   75 years Exam Location:  [facility] Procedure:      VAS [place] LOWER EXTREMITY VENOUS (DVT) Referring Phys: Erskine Emery --------------------------------------------------------------------------------  Indications: Pain, and Swelling.  Risk Factors: Surgery RT total hip replacement 11/23/2020. Comparison Study: No previous exams Performing Technologist: Jody Hill RVT, RDMS  Examination Guidelines: A complete evaluation includes B-mode imaging, spectral Doppler, color Doppler, and power Doppler as needed of all accessible portions of each vessel. Bilateral testing is considered an integral part of a complete examination. Limited examinations for reoccurring indications may be performed as noted. The reflux portion of the exam is performed with the patient in reverse Trendelenburg.  +---------+---------------+---------+-----------+----------+--------------+ RIGHT    CompressibilityPhasicitySpontaneityPropertiesThrombus Aging +---------+---------------+---------+-----------+----------+--------------+ CFV      Full           Yes      Yes                                 +---------+---------------+---------+-----------+----------+--------------+ SFJ      Full                                                        +---------+---------------+---------+-----------+----------+--------------+ FV Prox  Full           Yes      Yes                                 +---------+---------------+---------+-----------+----------+--------------+ FV Mid   Full           Yes      Yes                                  +---------+---------------+---------+-----------+----------+--------------+ FV DistalFull           Yes      Yes                                 +---------+---------------+---------+-----------+----------+--------------+ PFV      Full                                                        +---------+---------------+---------+-----------+----------+--------------+  POP      Full           Yes      Yes                                 +---------+---------------+---------+-----------+----------+--------------+ PTV      Full                                                        +---------+---------------+---------+-----------+----------+--------------+ PERO     Full                                                        +---------+---------------+---------+-----------+----------+--------------+   +----+---------------+---------+-----------+----------+--------------+ LEFTCompressibilityPhasicitySpontaneityPropertiesThrombus Aging +----+---------------+---------+-----------+----------+--------------+ CFV Full           Yes      Yes                                 +----+---------------+---------+-----------+----------+--------------+     Summary: RIGHT: - There is no evidence of deep vein thrombosis in the lower extremity. - There is no evidence of superficial venous thrombosis.  - No cystic structure found in the popliteal fossa. - Ultrasound characteristics of enlarged lymph nodes are noted in the groin. Subcutaneous edema extending from popliteal fossa to ankle.  LEFT: - No evidence of common femoral vein obstruction. - Ultrasound characteristics of enlarged lymph nodes noted in the groin.  *See table(s) above for measurements and observations. Electronically signed by Deitra Mayo MD on 12/07/2020 at 3:58:47 PM.    Final     Assessment/Plan 1. Need for influenza vaccination Afebrile. Flut shot administered by CMA no acute reaction reported.  - Flu Vaccine QUAD  High Dose(Fluad)  2. Status post total replacement of right hip Right hip surgical incision steri-strips intact surrounding skin without any erythema,tenderness or drainage.  - continue on tylenol and Robaxin  - continue with PT/OT  - CBC with Differential/Platelet - BMP with eGFR([facility])  3. Essential hypertension B/p stable  - continue on Olmesartan  and Amlodipine  - CBC with Differential/Platelet - BMP with eGFR([facility])  4. Age-related osteoporosis without current pathological fracture Due for bone density. - continue on Alendronate,Calcium - vitamin D and MVI  - DG Bone Density; Future  5. Bipolar 1 disorder, depressed ([place]) - continue on Venlafaxine   6. Hypothyroidism due to Hashimoto's thyroiditis Continue on levothyroxine   7. Unsteady gait Encouraged to continue to walk with Rolling walker  - continue PT/ OT  Fall and safety precaution advised - CBC with Differential/Platelet - BMP with eGFR([facility])  8. Postoperative anemia due to acute blood loss Status post right hip replacement  Hgb 7.8 ( 11/25/2020)  Will recheck CBC  Family/ staff Communication: Reviewed plan of care with patient verbalized understanding   Labs/tests ordered:  - CBC with Differential/Platelet - BMP with eGFR([facility])  Next Appointment: has upcoming appointment with PCP   Sandrea Hughs, NP

## 2020-12-09 ENCOUNTER — Telehealth: Payer: Self-pay | Admitting: *Deleted

## 2020-12-09 ENCOUNTER — Other Ambulatory Visit: Payer: Self-pay | Admitting: Internal Medicine

## 2020-12-09 ENCOUNTER — Other Ambulatory Visit: Payer: Self-pay | Admitting: Orthopedic Surgery

## 2020-12-09 DIAGNOSIS — E063 Autoimmune thyroiditis: Secondary | ICD-10-CM

## 2020-12-09 DIAGNOSIS — E038 Other specified hypothyroidism: Secondary | ICD-10-CM

## 2020-12-09 DIAGNOSIS — Z96641 Presence of right artificial hip joint: Secondary | ICD-10-CM | POA: Diagnosis not present

## 2020-12-09 DIAGNOSIS — Z471 Aftercare following joint replacement surgery: Secondary | ICD-10-CM | POA: Diagnosis not present

## 2020-12-09 DIAGNOSIS — I10 Essential (primary) hypertension: Secondary | ICD-10-CM

## 2020-12-09 LAB — CBC WITH DIFFERENTIAL/PLATELET
Absolute Monocytes: 730 cells/uL (ref 200–950)
Basophils Absolute: 148 cells/uL (ref 0–200)
Basophils Relative: 1.8 %
Eosinophils Absolute: 640 cells/uL — ABNORMAL HIGH (ref 15–500)
Eosinophils Relative: 7.8 %
HCT: 25.4 % — ABNORMAL LOW (ref 35.0–45.0)
Hemoglobin: 8.2 g/dL — ABNORMAL LOW (ref 11.7–15.5)
Lymphs Abs: 1968 cells/uL (ref 850–3900)
MCH: 29.5 pg (ref 27.0–33.0)
MCHC: 32.3 g/dL (ref 32.0–36.0)
MCV: 91.4 fL (ref 80.0–100.0)
MPV: 8.9 fL (ref 7.5–12.5)
Monocytes Relative: 8.9 %
Neutro Abs: 4715 cells/uL (ref 1500–7800)
Neutrophils Relative %: 57.5 %
Platelets: 622 10*3/uL — ABNORMAL HIGH (ref 140–400)
RBC: 2.78 10*6/uL — ABNORMAL LOW (ref 3.80–5.10)
RDW: 12.4 % (ref 11.0–15.0)
Total Lymphocyte: 24 %
WBC: 8.2 10*3/uL (ref 3.8–10.8)

## 2020-12-09 LAB — BASIC METABOLIC PANEL WITH GFR
BUN/Creatinine Ratio: 40 (calc) — ABNORMAL HIGH (ref 6–22)
BUN: 32 mg/dL — ABNORMAL HIGH (ref 7–25)
CO2: 24 mmol/L (ref 20–32)
Calcium: 9.1 mg/dL (ref 8.6–10.4)
Chloride: 103 mmol/L (ref 98–110)
Creat: 0.81 mg/dL (ref 0.60–1.00)
Glucose, Bld: 98 mg/dL (ref 65–139)
Potassium: 4.6 mmol/L (ref 3.5–5.3)
Sodium: 137 mmol/L (ref 135–146)
eGFR: 75 mL/min/{1.73_m2} (ref >=60)

## 2020-12-09 NOTE — Telephone Encounter (Signed)
12/07/20-Ortho bundle 14 day in person meeting during her office visit completed.

## 2020-12-10 DIAGNOSIS — I739 Peripheral vascular disease, unspecified: Secondary | ICD-10-CM | POA: Diagnosis not present

## 2020-12-10 DIAGNOSIS — B351 Tinea unguium: Secondary | ICD-10-CM | POA: Diagnosis not present

## 2020-12-10 DIAGNOSIS — M792 Neuralgia and neuritis, unspecified: Secondary | ICD-10-CM | POA: Diagnosis not present

## 2020-12-10 DIAGNOSIS — F3131 Bipolar disorder, current episode depressed, mild: Secondary | ICD-10-CM | POA: Diagnosis not present

## 2020-12-14 ENCOUNTER — Ambulatory Visit: Payer: Medicare Other | Admitting: Psychiatry

## 2020-12-14 DIAGNOSIS — Z471 Aftercare following joint replacement surgery: Secondary | ICD-10-CM | POA: Diagnosis not present

## 2020-12-14 DIAGNOSIS — Z96641 Presence of right artificial hip joint: Secondary | ICD-10-CM | POA: Diagnosis not present

## 2020-12-16 DIAGNOSIS — Z471 Aftercare following joint replacement surgery: Secondary | ICD-10-CM | POA: Diagnosis not present

## 2020-12-16 DIAGNOSIS — Z96641 Presence of right artificial hip joint: Secondary | ICD-10-CM | POA: Diagnosis not present

## 2020-12-19 DIAGNOSIS — Z471 Aftercare following joint replacement surgery: Secondary | ICD-10-CM | POA: Diagnosis not present

## 2020-12-21 ENCOUNTER — Other Ambulatory Visit: Payer: Medicare Other

## 2020-12-21 ENCOUNTER — Ambulatory Visit (INDEPENDENT_AMBULATORY_CARE_PROVIDER_SITE_OTHER): Payer: Medicare Other | Admitting: Gastroenterology

## 2020-12-21 ENCOUNTER — Encounter: Payer: Self-pay | Admitting: Gastroenterology

## 2020-12-21 ENCOUNTER — Other Ambulatory Visit: Payer: Self-pay

## 2020-12-21 ENCOUNTER — Ambulatory Visit (INDEPENDENT_AMBULATORY_CARE_PROVIDER_SITE_OTHER): Payer: Medicare Other | Admitting: Psychiatry

## 2020-12-21 VITALS — BP 132/78 | HR 84 | Ht 64.0 in | Wt 147.0 lb

## 2020-12-21 DIAGNOSIS — I200 Unstable angina: Secondary | ICD-10-CM | POA: Diagnosis not present

## 2020-12-21 DIAGNOSIS — R197 Diarrhea, unspecified: Secondary | ICD-10-CM

## 2020-12-21 DIAGNOSIS — F411 Generalized anxiety disorder: Secondary | ICD-10-CM

## 2020-12-21 DIAGNOSIS — A048 Other specified bacterial intestinal infections: Secondary | ICD-10-CM

## 2020-12-21 DIAGNOSIS — Z471 Aftercare following joint replacement surgery: Secondary | ICD-10-CM | POA: Diagnosis not present

## 2020-12-21 NOTE — Progress Notes (Signed)
Jackie Jones    [id number]    1946/02/01  Primary Care Physician:[place], Mervyn Gay, NP  Referring Physician: Yvonna Alanis, NP [address],  [place] [address]   Chief complaint:  Diarrhea  HPI:  75 year old very pleasant female here for new patient visit accompanied by her sister. She has been having on and off diarrhea for past 7 years.  She has severe diarrhea triggered by certain foods.  She feels she has lactose intolerance, sensitivity to soy and gluten.  She has been using Lactaid as needed and tries to avoid lactose.  She is also limiting intake of gluten or soy based products.  She continues to have intermittent episodes of diarrhea.  Denies any nocturnal diarrhea, mucus or blood in stool.  She had colonoscopy approximately 7 years ago in [facility], was normal except for mild colitis.  According to patient it was not ulcerative colitis.  She also had surgery for prolapsed rectum around the same time.  Denies any dysphagia, odynophagia, nausea, vomiting or abdominal pain.  No unintentional weight loss. No family history of GI malignancy.  She is s/p right hip replacement Outpatient Encounter Medications as of 12/21/2020  Medication Sig   acetaminophen (TYLENOL) 500 MG tablet Take 1,000 mg by mouth in the morning and at bedtime.   alendronate (FOSAMAX) 70 MG tablet TAKE 1 TABLET(70 MG) BY MOUTH 1 TIME A WEEK WITH A FULL GLASS OF WATER AND ON AN EMPTY STOMACH   amLODipine (NORVASC) 10 MG tablet TAKE 1 TABLET(10 MG) BY MOUTH DAILY   aspirin 81 MG chewable tablet Chew 1 tablet (81 mg total) by mouth 2 (two) times daily.   Calcium-Vitamin D-Vitamin K (VIACTIV CALCIUM PLUS D) 650-12.5-40 MG-MCG-MCG CHEW Chew 2 tablets by mouth daily.   celecoxib (CELEBREX) 50 MG capsule Take 1 capsule (50 mg total) by mouth 2 (two) times daily.   cyanocobalamin (,VITAMIN B-12,) 1000 MCG/ML injection INJECT 1 ML INTO THE MUSCLE EVERY 30 DAYS   divalproex (DEPAKOTE ER) 500  MG 24 hr tablet Take 1 tablet (500 mg total) by mouth at bedtime.   Emollient (AVEENO RESTORATIVE SKIN THERAP) CREA Apply 1 application topically daily.   halobetasol (ULTRAVATE) 0.05 % ointment Apply 1 application topically daily as needed (eczema).   levothyroxine (SYNTHROID) 112 MCG tablet Take 1 tablet (112 mcg total) by mouth daily.   loratadine (CLARITIN) 10 MG tablet Take 10 mg by mouth daily.   methocarbamol (ROBAXIN) 500 MG tablet Take 1 tablet (500 mg total) by mouth every 6 (six) hours as needed for muscle spasms.   Multiple Vitamins-Minerals (CENTRUM SILVER ADULT 50+ PO) Take 1 tablet by mouth daily.   Multiple Vitamins-Minerals (PRESERVISION AREDS) CAPS Take 1 capsule by mouth in the morning and at bedtime.   olmesartan (BENICAR) 20 MG tablet TAKE 1 TABLET(20 MG) BY MOUTH DAILY   Polyethyl Glycol-Propyl Glycol 0.4-0.3 % SOLN Place 1 drop into both eyes in the morning and at bedtime.   SYRINGE-NEEDLE, DISP, 3 ML (BD SAFETYGLIDE SYRINGE/NEEDLE) 25G X 1" 3 ML MISC Inject 32ml in deltoid once monthly   venlafaxine XR (EFFEXOR-XR) 150 MG 24 hr capsule Take 150 mg by mouth daily with breakfast.   No facility-administered encounter medications on file as of 12/21/2020.    Allergies as of 12/21/2020 - Review Complete 12/21/2020  Allergen Reaction Noted   Contrast media [iodinated diagnostic agents] Itching 05/10/2020   Trintellix [vortioxetine] Other (See Comments)  04/15/2020   Dairycare [lactase-lactobacillus]  03/15/2020   Doxycycline hyclate Nausea And Vomiting 11/09/2020   Soy allergy Itching 11/15/2020   Ciprofloxacin Rash 03/01/2018   Levaquin [levofloxacin] Rash 03/01/2018   Penicillins Rash 03/01/2018    Past Medical History:  Diagnosis Date   Allergy    Anxiety 2000   Arthritis    Bipolar 1 disorder, depressed ([place])    Bipolar disorder ([place])    Chicken pox    Dementia ([place])    Depression    Dysrhythmia 05/10/2020   pt states "cardiologist told me it is not  dangerous, it's an anomaly"   Hashimoto's thyroiditis    History of blood transfusion    History of bone density study 2019   History of colonoscopy 2015   History of CT scan 2019   History of mammogram 2019   History of MRI    History of Papanicolaou smear of cervix    History of right hip replacement    Hx: UTI (urinary tract infection)    Hypertension    Hypothyroidism    Lactose intolerance    Legally blind in left eye, as defined in [place]    Non-celiac gluten sensitivity    Osteoporosis    Pernicious anemia    Pernicious anemia    Rheumatic fever    Thyroid disease    Urinary and fecal incontinence     Past Surgical History:  Procedure Laterality Date   [place]   EYE SURGERY Bilateral 2014   cataracts   LEFT HEART CATH AND CORONARY ANGIOGRAPHY N/A 05/10/2020   Procedure: LEFT HEART CATH AND CORONARY ANGIOGRAPHY;  Surgeon: Nelva Bush, MD;  Location: [facility];  Service: Cardiovascular;  Laterality: N/A;   prolapsed rectum 2015     TONSILLECTOMY AND ADENOIDECTOMY  1966   TOTAL HIP ARTHROPLASTY Right 11/23/2020   Procedure: RIGHT TOTAL HIP ARTHROPLASTY ANTERIOR APPROACH;  Surgeon: Mcarthur Rossetti, MD;  Location: [facility];  Service: Orthopedics;  Laterality: Right;   TUBAL LIGATION      Family History  Problem Relation Age of Onset   Heart disease Mother    Arthritis Mother    Cancer Father        bladder   Thyroid disease Father    Dementia Father    Hypertension Sister    Hypertension Brother    Hypertension Sister    Hypertension Sister    Diabetes Brother    Diabetes type II Son     Social History   Socioeconomic History   Marital status: Single    Spouse name: Not on file   Number of children: Not on file   Years of education: Not on file   Highest education level: Not on file  Occupational History   Not on file  Tobacco Use   Smoking status: Never   Smokeless tobacco: Never  Vaping  Use   Vaping Use: Never used  Substance and Sexual Activity   Alcohol use: Never   Drug use: Never   Sexual activity: Not Currently  Other Topics Concern   Not on file  Social History Narrative   Tobacco use, amount per day now: None.   Past tobacco use, amount per day: None.   How many years did you use tobacco: None.   Alcohol use (drinks per week): None.   Diet: I eat a low fat diet. Much in vegetables.    Do you drink/eat  things with caffeine: Yes.   Marital status:  Divorced                                What year were you married? 1976   Do you live in a house, apartment, assisted living, condo, trailer, etc.? Rent small house.   Is it one or more stories? One.   How many persons live in your home? I live alone.    Do you have pets in your home?( please list) No.   Highest Level of education completed? Masters Degree Plus.   Current or past profession: Pharmacist, hospital.   Do you exercise? Yes.                                  Type and how often? 5 days a week I walk.    Do you have a living will? Yes.   Do you have a DNR form?    Yes                               If not, do you want to discuss one?   Do you have signed POA/HPOA forms?  Yes.                      If so, please bring to you appointment      Do you have any difficulty bathing or dressing yourself? No.   Do you have any difficulty preparing food or eating? No.   Do you have any difficulty managing your medications? No.   Do you have any difficulty managing your finances? Yes.   Do you have any difficulty affording your medications? No.   Social Determinants of Health   Financial Resource Strain: Not on file  Food Insecurity: Not on file  Transportation Needs: Not on file  Physical Activity: Not on file  Stress: Not on file  Social Connections: Not on file  Intimate Partner Violence: Not on file      Review of systems: All other review of systems negative except as mentioned in the HPI.   Physical  Exam: Vitals:   12/21/20 1015  BP: 132/78  Pulse: 84   Body mass index is 25.23 kg/m. Gen:      No acute distress HEENT:  sclera anicteric Abd:      soft, non-tender; no palpable masses, no distension Ext:    No edema Neuro: alert and oriented x 3 Psych: normal mood and affect  Data Reviewed:  Reviewed labs, radiology imaging, old records and pertinent past GI work up   Assessment and Plan/Recommendations:  74 year old very pleasant female with complaints of chronic intermittent diarrhea Will check GI pathogen panel to exclude any GI infection History of ?  Colitis: We will check fecal lactoferrin to evaluate for colonic inflammation Check fecal elastase and fecal fat to exclude pancreatic insufficiency Check TTG IgA antibody to exclude celiac disease Advised patient to continue with lactose-free diet  Start Benefiber 1 tablespoon 3 times daily with meals If continues to have irregular bowel habits or diarrhea, will do a trial of Colestid 1 g daily for possible bile salt induced diarrhea.  Advised patient to avoid taking Colestid within 2 to 3 hours of other medication to prevent drug interaction  If continues to have persistent  symptoms or diarrhea, will consider colonoscopy with biopsies for further evaluation  Return in 6 to 8 weeks or sooner if needed  The patient was provided an opportunity to ask questions and all were answered. The patient agreed with the plan and demonstrated an understanding of the instructions.  Damaris Hippo , MD    CC: Yvonna Alanis, NP

## 2020-12-21 NOTE — Patient Instructions (Addendum)
Your provider has requested that you go to the basement level for lab work before leaving today. Press "B" on the elevator. The lab is located at the first door on the left as you exit the elevator.   Take Benefiber 1 tablespoon three times a day with meals  If Benefiber doesn't help wait 2 weeks then start Colestid 1 gm daily, AVOID taking within 2-3 hours with other medications to prevent drug interactions . Call us if you decide you want colestid called into your pharmacy   Due to recent changes in healthcare laws, you may see the results of your imaging and laboratory studies on MyChart before your provider has had a chance to review them.  We understand that in some cases there may be results that are confusing or concerning to you. Not all laboratory results come back in the same time frame and the provider may be waiting for multiple results in order to interpret others.  Please give [place] 48 hours in order for your provider to thoroughly review all the results before contacting the office for clarification of your results.    I appreciate the  opportunity to care for you  Thank You   Harl Bowie , MD

## 2020-12-21 NOTE — Progress Notes (Signed)
[facility] Counselor/Therapist Progress Note  Patient ID: Jackie Jones, MRN: [id number],    Date: 12/21/2020  Time Spent: 50 minutes   Treatment Type: Individual Therapy  Reported Symptoms: anxiety (some better)  Mental Status Exam:  Appearance:   Neat     Behavior:  Appropriate, Sharing, and Motivated  Motor:  Some slower due to hip replacement and using cane  Speech/Language:   Clear and Coherent  Affect:  anxious  Mood:  Anxious (improving some)  Thought process:  goal directed  Thought content:    Overthinking, worrier  Sensory/Perceptual disturbances:    WNL  Orientation:  oriented to person, place, time/date, situation, day of week, month of year, year, and stated date of Oct. 4, 2022  Attention:  Good  Concentration:  Good  Memory:  WNL  Fund of knowledge:   Good  Insight:    Good  Judgment:   Good  Impulse Control:  Good   Risk Assessment: Danger to Self:  No Self-injurious Behavior: No Danger to Others: No Duty to Warn:no Physical Aggression / Violence:No  Access to Firearms a concern: No  Gang Involvement:No   Subjective: Patient in today reporting anxiety and "I am improving." Sees Dr. Sanjuana Letters, psychiatrist who prescribes her meds, Depakote, and Effexor ext. Release (generic). Had hip replacement about 3 wks ago and has done well so far. Was anxious before her surgery and gets anxious before appts of any kind. Has felt more encouraged with her surgery over and her recuperation going well."  "Already dreading the winter months as I get more depressed in the winter."  Discussed how she has tried to manage previously and assumed the worst and "worried a lot". Very motivated, even with her fears and "some shyness" around other people "but I'm getting better gradually with that." Talked more about her seasonal affective disorder and challenges she faces, and some strategies that can help and will follow up on this more next visit.  Feels blessed that she lives  in a building with other people as this helps her cope better with stress and anxiety.  Was very open today and appreciative of the time to talk through her anxieties and fears and feel heard and supported. Agrees to work on her goals between sessions.  Does have history of bipolar as she reports "in the past" but currently "I do mostly with anxiety year-round and seasonal and affective disorder in the winter months".  Interventions: Solution-Oriented/Positive Psychology, Ego-Supportive, and Insight-Oriented  Diagnosis:   ICD-10-CM   1. Generalized anxiety disorder  F41.1      Treatment Goal Plan: Patient not signing treatment goal plan on computer screen due to COVID. Treatment goals: Treatment goals remain on treatment plan as patient works with strategies to achieve her goals.  Progress will be assessed at each session and documented in the "progress" section of treatment note. Long-term goal: Reduce overall level, frequency, and intensity of the anxiety so that daily functioning is not as impaired. Short-term goal: Increase understanding of beliefs and messages that produce the worry and anxiety Strategies: Help the client develop reality-based, positive cognitive messages.     Plan: Patient today showing good motivation and participation in session.  Focused today mostly on her current anxiety which she is managing pretty well currently with cognitive and behavioral strategies along with her medication.  Did present as rather worried about the winter months coming up and how "I get seasonal affect of disorder".  Describes her main symptom  during that time as being depressed.  I assured patient that we will be tuned into that and able to help her, and will depend on her to let us know if she is beginning to feel depressed before her appointment.  Interacts well today and seems to be grateful for the opportunity to vent her feelings and get support.  Encouraged patient to practice good  self-care including: Having good boundaries and limits with others as needed, saying no when she needs to say no, asking for help when she needs help, being safe and moving about her apartment especially since she had recent surgery, staying in touch with people who are supportive of her, allowing for good sleep patterns, getting outside some a little each day as she is able, healthy nutrition, walking as she is able since her recent surgery, positive self talk, seeing the positives within herself, looking more for the positives versus negatives each day, and recognize the strengths she is showing as she works with goal-directed behaviors to move in a direction that supports improved emotional health and overall stability.  Goal review and progress/challenges noted with patient.  Next appointment within 3 weeks.  This record has been created using [employer].  Chart creation errors have been sought, but may not always have been located and corrected.  Such creation errors do not reflect on the standard of medical care provided.    Shanon Ace, LCSW

## 2020-12-22 LAB — TISSUE TRANSGLUTAMINASE, IGA: (tTG) Ab, IgA: <1 U/mL

## 2020-12-22 LAB — IGA: Immunoglobulin A: 260 mg/dL (ref 70–320)

## 2020-12-23 ENCOUNTER — Telehealth: Payer: Self-pay

## 2020-12-23 NOTE — Telephone Encounter (Signed)
FAXED

## 2020-12-23 NOTE — Telephone Encounter (Signed)
[facility] would like medical clearance for dental cleaning and a note/letter faxed if pre-medication is needed before dental procedures.  CB# [phone].  Fax# [phone].  Please advise.  Thank you.

## 2020-12-23 NOTE — Telephone Encounter (Signed)
Note completed. Do you mind faxing this for me?

## 2020-12-24 ENCOUNTER — Other Ambulatory Visit: Payer: Medicare Other

## 2020-12-24 DIAGNOSIS — R197 Diarrhea, unspecified: Secondary | ICD-10-CM | POA: Diagnosis not present

## 2020-12-24 DIAGNOSIS — A048 Other specified bacterial intestinal infections: Secondary | ICD-10-CM

## 2020-12-27 ENCOUNTER — Other Ambulatory Visit: Payer: Self-pay | Admitting: Orthopaedic Surgery

## 2020-12-27 DIAGNOSIS — Z23 Encounter for immunization: Secondary | ICD-10-CM | POA: Diagnosis not present

## 2020-12-27 LAB — SPECIMEN STATUS REPORT

## 2020-12-28 DIAGNOSIS — Z471 Aftercare following joint replacement surgery: Secondary | ICD-10-CM | POA: Diagnosis not present

## 2020-12-28 LAB — GI PROFILE, STOOL, PCR

## 2020-12-30 DIAGNOSIS — Z471 Aftercare following joint replacement surgery: Secondary | ICD-10-CM | POA: Diagnosis not present

## 2020-12-30 LAB — EXTRA SPECIMEN

## 2020-12-30 LAB — FECAL LACTOFERRIN, QUANT
Fecal Lactoferrin: NEGATIVE
MICRO NUMBER:: 12475680
SPECIMEN QUALITY:: ADEQUATE

## 2020-12-30 LAB — FECAL FAT, QUALITATIVE: FECAL FAT, QUALITATIVE: NORMAL

## 2020-12-30 LAB — PANCREATIC ELASTASE, FECAL: Pancreatic Elastase-1, Stool: >500 mcg/g

## 2020-12-31 ENCOUNTER — Telehealth: Payer: Self-pay | Admitting: Gastroenterology

## 2020-12-31 NOTE — Telephone Encounter (Signed)
Dr Darleen Crocker- Called patient back, and called the lab   Her GI Pathogen panel was cancelled, I called the lab and Santiago Glad said that they are out of the tubes for the GI pathogen panel and told the patient when they gave her the kits that she she needed to go to [place] to pickup the GI pathogen panel kit.. The patient stated they did not tell her that or she would have went, She says she doesn't want to do the test now and will discuss it at her next appointment

## 2020-12-31 NOTE — Telephone Encounter (Signed)
Inbound call from patient requesting a call please in regards to the stool labs she submitted.

## 2021-01-01 NOTE — Telephone Encounter (Signed)
Ok, I will discuss with patient at office visit. Thanks

## 2021-01-03 ENCOUNTER — Other Ambulatory Visit: Payer: Self-pay

## 2021-01-03 ENCOUNTER — Other Ambulatory Visit: Payer: Medicare Other

## 2021-01-03 DIAGNOSIS — E038 Other specified hypothyroidism: Secondary | ICD-10-CM | POA: Diagnosis not present

## 2021-01-03 DIAGNOSIS — E063 Autoimmune thyroiditis: Secondary | ICD-10-CM | POA: Diagnosis not present

## 2021-01-03 DIAGNOSIS — I10 Essential (primary) hypertension: Secondary | ICD-10-CM | POA: Diagnosis not present

## 2021-01-04 ENCOUNTER — Other Ambulatory Visit: Payer: Self-pay | Admitting: Orthopaedic Surgery

## 2021-01-04 ENCOUNTER — Encounter: Payer: Self-pay | Admitting: Orthopaedic Surgery

## 2021-01-04 ENCOUNTER — Ambulatory Visit (INDEPENDENT_AMBULATORY_CARE_PROVIDER_SITE_OTHER): Payer: Medicare Other | Admitting: Orthopaedic Surgery

## 2021-01-04 DIAGNOSIS — Z96641 Presence of right artificial hip joint: Secondary | ICD-10-CM

## 2021-01-04 LAB — BASIC METABOLIC PANEL
BUN: 25 mg/dL (ref 7–25)
CO2: 28 mmol/L (ref 20–32)
Calcium: 9.7 mg/dL (ref 8.6–10.4)
Chloride: 98 mmol/L (ref 98–110)
Creat: 0.68 mg/dL (ref 0.60–1.00)
Glucose, Bld: 89 mg/dL (ref 65–99)
Potassium: 4.8 mmol/L (ref 3.5–5.3)
Sodium: 135 mmol/L (ref 135–146)

## 2021-01-04 LAB — CBC WITH DIFFERENTIAL/PLATELET
Absolute Monocytes: 656 cells/uL (ref 200–950)
Basophils Absolute: 97 cells/uL (ref 0–200)
Basophils Relative: 1.2 %
Eosinophils Absolute: 502 cells/uL — ABNORMAL HIGH (ref 15–500)
Eosinophils Relative: 6.2 %
HCT: 30.4 % — ABNORMAL LOW (ref 35.0–45.0)
Hemoglobin: 9.6 g/dL — ABNORMAL LOW (ref 11.7–15.5)
Lymphs Abs: 2155 cells/uL (ref 850–3900)
MCH: 26.7 pg — ABNORMAL LOW (ref 27.0–33.0)
MCHC: 31.6 g/dL — ABNORMAL LOW (ref 32.0–36.0)
MCV: 84.7 fL (ref 80.0–100.0)
MPV: 10 fL (ref 7.5–12.5)
Monocytes Relative: 8.1 %
Neutro Abs: 4690 cells/uL (ref 1500–7800)
Neutrophils Relative %: 57.9 %
Platelets: 449 10*3/uL — ABNORMAL HIGH (ref 140–400)
RBC: 3.59 10*6/uL — ABNORMAL LOW (ref 3.80–5.10)
RDW: 13.7 % (ref 11.0–15.0)
Total Lymphocyte: 26.6 %
WBC: 8.1 10*3/uL (ref 3.8–10.8)

## 2021-01-04 LAB — TSH: TSH: 3.72 mIU/L (ref 0.40–4.50)

## 2021-01-04 NOTE — Progress Notes (Signed)
The patient is 6 weeks status post a right total hip arthroplasty.  She does ambulate using a cane but does not use it much.  She feels like she is ready to get back into the pool and I agree with this.  She is still having coughing at night and she sees her primary care physician and someone have her ask her PCP about this.  She just had spinal anesthesia from a surgical standpoint.  She says the hip is doing very well.  She is now off of narcotics.  She does take occasional muscle relaxant and that is actually helped her seasonal affective disorder.  Both hips move smoothly and fluidly.  She is not complaining of any other issues today in terms of the leg length discrepancies.  My standpoint I will need to see her back for 6 months.  At that visit we will have a standing low pelvis and a lateral of her right operative hip.  She does not need antibiotics for routine dental cleanings.

## 2021-01-05 ENCOUNTER — Ambulatory Visit: Payer: Medicare Other

## 2021-01-06 ENCOUNTER — Encounter: Payer: Self-pay | Admitting: Orthopedic Surgery

## 2021-01-06 ENCOUNTER — Ambulatory Visit (INDEPENDENT_AMBULATORY_CARE_PROVIDER_SITE_OTHER): Payer: Medicare Other | Admitting: Orthopedic Surgery

## 2021-01-06 ENCOUNTER — Other Ambulatory Visit: Payer: Self-pay

## 2021-01-06 VITALS — BP 124/78 | HR 90 | Temp 97.1°F | Ht 64.0 in | Wt 142.0 lb

## 2021-01-06 DIAGNOSIS — R2681 Unsteadiness on feet: Secondary | ICD-10-CM

## 2021-01-06 DIAGNOSIS — D62 Acute posthemorrhagic anemia: Secondary | ICD-10-CM | POA: Diagnosis not present

## 2021-01-06 DIAGNOSIS — D510 Vitamin B12 deficiency anemia due to intrinsic factor deficiency: Secondary | ICD-10-CM | POA: Diagnosis not present

## 2021-01-06 DIAGNOSIS — M810 Age-related osteoporosis without current pathological fracture: Secondary | ICD-10-CM

## 2021-01-06 DIAGNOSIS — I10 Essential (primary) hypertension: Secondary | ICD-10-CM | POA: Diagnosis not present

## 2021-01-06 DIAGNOSIS — E063 Autoimmune thyroiditis: Secondary | ICD-10-CM

## 2021-01-06 DIAGNOSIS — Z96641 Presence of right artificial hip joint: Secondary | ICD-10-CM | POA: Diagnosis not present

## 2021-01-06 DIAGNOSIS — E038 Other specified hypothyroidism: Secondary | ICD-10-CM | POA: Diagnosis not present

## 2021-01-06 DIAGNOSIS — Z23 Encounter for immunization: Secondary | ICD-10-CM | POA: Diagnosis not present

## 2021-01-06 DIAGNOSIS — F319 Bipolar disorder, unspecified: Secondary | ICD-10-CM

## 2021-01-06 DIAGNOSIS — R197 Diarrhea, unspecified: Secondary | ICD-10-CM | POA: Diagnosis not present

## 2021-01-06 MED ORDER — "BD SAFETYGLIDE SYRINGE/NEEDLE 25G X 1"" 3 ML MISC": 11 refills | Status: AC

## 2021-01-06 MED ORDER — CYANOCOBALAMIN 1000 MCG/ML IJ SOLN: INTRAMUSCULAR | 11 refills | Status: DC

## 2021-01-06 NOTE — Patient Instructions (Signed)
Please get shingles vaccination with local pharmacist

## 2021-01-06 NOTE — Progress Notes (Signed)
Careteam: Patient Care Team: Yvonna Alanis, NP as PCP - General (Adult Health Nurse Practitioner)  Seen by: Windell Moulding, AGNP-C  PLACE OF SERVICE:  [place] Directive information Does Patient Have a Medical Advance Directive?: Yes, Type of Advance Directive: [place], Does patient want to make changes to medical advance directive?: No - Patient declined  Allergies  Allergen Reactions   Contrast Media [Iodinated Diagnostic Agents] Itching   Trintellix [Vortioxetine] Other (See Comments)    Led to mania   Dairycare [Lactase-Lactobacillus]    Doxycycline Hyclate Nausea And Vomiting    Double vision   Soy Allergy Itching   Ciprofloxacin Rash   Levaquin [Levofloxacin] Rash   Penicillins Rash    Chief Complaint  Patient presents with   Medical Management of Chronic Issues    4 month follow-up. Care gaps reviewed, discuss need for hep c screening, shingrix, and pcv vaccines or postpone/exclude if patient refuses.       HPI: Patient is a 75 y.o. female seen today for medical management of chronic conditions.   Likes staying at [facility]- continues to live in [place].   09/06 [place] by Dr. Ninfa Linden. Pleased with surgery. Reports having her independence back. [place] social in [place] now that she can walk. Denies right hip pain, off narcotics. Walking with cane. She is still ding PT, upset she is not finished. Using muscle relaxer for muscle spasms. No recent falls. Believes muscle relaxer is helping her with seasonal affective disorder. Reports improved mood.   Seeing new psychotherapist, Rinaldo Cloud at [facility]. Still taking effexor. Denies panic attacks or mania.   Recently seen by GI for ongoing diarrhea. Labs ordered. Reports not being given specimen cup. Advised to call office to obtain new one. She started taking benefiber and reports less diarrhea.   She reports having all covid boosters and recent flu vaccine.    Review of Systems:   Review of Systems  Constitutional:  Negative for chills, fever, malaise/fatigue and weight loss.  HENT:  Negative for hearing loss and sore throat.   Eyes:  Negative for blurred vision and double vision.  Respiratory:  Negative for cough, shortness of breath and wheezing.   Cardiovascular:  Negative for chest pain and leg swelling.  Gastrointestinal:  Positive for diarrhea. Negative for abdominal pain, blood in stool, constipation, heartburn, nausea and vomiting.  Genitourinary:  Negative for dysuria and hematuria.  Musculoskeletal:  Positive for joint pain and myalgias. Negative for falls.       Recent right hip replacement  Neurological:  Negative for dizziness, weakness and headaches.  Psychiatric/Behavioral:  Positive for depression. The patient is nervous/anxious. The patient does not have insomnia.    Past Medical History:  Diagnosis Date   Allergy    Anxiety 2000   Arthritis    Bipolar 1 disorder, depressed ([place])    Bipolar disorder ([place])    Chicken pox    Dementia ([place])    Depression    Dysrhythmia 05/10/2020   pt states "cardiologist told me it is not dangerous, it's an anomaly"   Hashimoto's thyroiditis    History of blood transfusion    History of bone density study 2019   History of colonoscopy 2015   History of CT scan 2019   History of mammogram 2019   History of MRI    History of Papanicolaou smear of cervix    History of right hip replacement    Hx: UTI (urinary tract  infection)    Hypertension    Hypothyroidism    Lactose intolerance    Legally blind in left eye, as defined in [place]    Non-celiac gluten sensitivity    Osteoporosis    Pernicious anemia    Pernicious anemia    Rheumatic fever    Thyroid disease    Urinary and fecal incontinence    Past Surgical History:  Procedure Laterality Date   [place]   EYE SURGERY Bilateral 2014   cataracts   LEFT HEART CATH AND CORONARY ANGIOGRAPHY N/A 05/10/2020    Procedure: LEFT HEART CATH AND CORONARY ANGIOGRAPHY;  Surgeon: Nelva Bush, MD;  Location: [facility];  Service: Cardiovascular;  Laterality: N/A;   prolapsed rectum 2015     TONSILLECTOMY AND ADENOIDECTOMY  1966   TOTAL HIP ARTHROPLASTY Right 11/23/2020   Procedure: RIGHT TOTAL HIP ARTHROPLASTY ANTERIOR APPROACH;  Surgeon: Mcarthur Rossetti, MD;  Location: [facility];  Service: Orthopedics;  Laterality: Right;   TUBAL LIGATION     Social History:   reports that she has never smoked. She has never used smokeless tobacco. She reports that she does not drink alcohol and does not use drugs.  Family History  Problem Relation Age of Onset   Heart disease Mother    Arthritis Mother    Cancer Father        bladder   Thyroid disease Father    Dementia Father    Hypertension Sister    Hypertension Brother    Hypertension Sister    Hypertension Sister    Diabetes Brother    Diabetes type II Son     Medications: Patient's Medications  New Prescriptions   No medications on file  Previous Medications   ACETAMINOPHEN (TYLENOL) 500 MG TABLET    Take 1,000 mg by mouth in the morning and at bedtime.   ALENDRONATE (FOSAMAX) 70 MG TABLET    TAKE 1 TABLET(70 MG) BY MOUTH 1 TIME A WEEK WITH A FULL GLASS OF WATER AND ON AN EMPTY STOMACH   AMLODIPINE (NORVASC) 10 MG TABLET    TAKE 1 TABLET(10 MG) BY MOUTH DAILY   ASPIRIN 81 MG CHEWABLE TABLET    Chew 1 tablet (81 mg total) by mouth 2 (two) times daily.   CALCIUM-VITAMIN D-VITAMIN K (VIACTIV CALCIUM PLUS D) 650-12.5-40 MG-MCG-MCG CHEW    Chew 2 tablets by mouth daily.   CELECOXIB (CELEBREX) 50 MG CAPSULE    Take 1 capsule (50 mg total) by mouth 2 (two) times daily.   CYANOCOBALAMIN (,VITAMIN B-12,) 1000 MCG/ML INJECTION    INJECT 1 ML INTO THE MUSCLE EVERY 30 DAYS   DIVALPROEX (DEPAKOTE ER) 500 MG 24 HR TABLET    Take 1 tablet (500 mg total) by mouth at bedtime.   EMOLLIENT (AVEENO RESTORATIVE SKIN THERAP) CREA    Apply 1 application  topically daily.   HALOBETASOL (ULTRAVATE) 0.05 % OINTMENT    Apply 1 application topically daily as needed (eczema).   LEVOTHYROXINE (SYNTHROID) 112 MCG TABLET    Take 1 tablet (112 mcg total) by mouth daily.   LORATADINE (CLARITIN) 10 MG TABLET    Take 10 mg by mouth daily.   METHOCARBAMOL (ROBAXIN) 500 MG TABLET    TAKE 1 TABLET(500 MG) BY MOUTH EVERY 6 HOURS AS NEEDED FOR MUSCLE SPASMS   MULTIPLE VITAMINS-MINERALS (CENTRUM SILVER ADULT 50+ PO)    Take 1 tablet by mouth daily.  MULTIPLE VITAMINS-MINERALS (PRESERVISION AREDS) CAPS    Take 1 capsule by mouth in the morning and at bedtime.   OLMESARTAN (BENICAR) 20 MG TABLET    TAKE 1 TABLET(20 MG) BY MOUTH DAILY   POLYETHYL GLYCOL-PROPYL GLYCOL 0.4-0.3 % SOLN    Place 1 drop into both eyes in the morning and at bedtime.   SYRINGE-NEEDLE, DISP, 3 ML (BD SAFETYGLIDE SYRINGE/NEEDLE) 25G X 1" 3 ML MISC    Inject 84ml in deltoid once monthly   TIZANIDINE (ZANAFLEX) 4 MG TABLET    TAKE 1 TABLET(4 MG) BY MOUTH EVERY 8 HOURS AS NEEDED FOR MUSCLE SPASMS   VENLAFAXINE XR (EFFEXOR-XR) 150 MG 24 HR CAPSULE    Take 150 mg by mouth daily with breakfast.  Modified Medications   No medications on file  Discontinued Medications   No medications on file    Physical Exam:  Vitals:   01/06/21 1141  BP: 124/78  Pulse: 90  Temp: (!) 97.1 F (36.2 C)  TempSrc: Temporal  SpO2: 98%  Weight: 142 lb (64.4 kg)  Height: 5\' 4"  (1.626 m)   Body mass index is 24.37 kg/m. Wt Readings from Last 3 Encounters:  01/06/21 142 lb (64.4 kg)  12/21/20 147 lb (66.7 kg)  12/08/20 149 lb 3.2 oz (67.7 kg)    Physical Exam Vitals reviewed.  Constitutional:      General: She is not in acute distress. HENT:     Head: Normocephalic.     Right Ear: There is no impacted cerumen.     Left Ear: There is no impacted cerumen.     Nose: Nose normal.  Eyes:     General:        Right eye: No discharge.        Left eye: No discharge.  Neck:     Thyroid: No thyroid mass  or thyromegaly.     Vascular: No carotid bruit.  Cardiovascular:     Rate and Rhythm: Normal rate and regular rhythm.     Pulses: Normal pulses.     Heart sounds: Normal heart sounds. No murmur heard. Pulmonary:     Effort: Pulmonary effort is normal.     Breath sounds: Normal breath sounds.  Abdominal:     General: Abdomen is flat. Bowel sounds are normal. There is no distension.     Palpations: Abdomen is soft.     Tenderness: There is no abdominal tenderness.  Musculoskeletal:     Cervical back: Normal range of motion.     Right lower leg: Edema present.     Left lower leg: No edema.     Comments: Non-pitting  Lymphadenopathy:     Cervical: No cervical adenopathy.  Skin:    General: Skin is warm and dry.     Capillary Refill: Capillary refill takes less than 2 seconds.     Comments: Right hip incision healed  Neurological:     General: No focal deficit present.     Mental Status: She is alert and oriented to person, place, and time.  Psychiatric:        Mood and Affect: Mood normal.        Behavior: Behavior normal.    Labs reviewed: Basic Metabolic Panel: Recent Labs    08/31/20 1106 11/15/20 1013 11/24/20 0428 12/08/20 1538 01/03/21 0903  NA 133*   < > 131* 137 135  K 4.9   < > 5.0 4.6 4.8  CL 101   < > 101 103 98  CO2 21   < > 25 24 28   GLUCOSE 124   < > 134* 98 89  BUN 31*   < > 23 32* 25  CREATININE 1.06*   < > 0.85 0.81 0.68  CALCIUM 9.4   < > 8.3* 9.1 9.7  TSH 0.08*  --   --   --  3.72   < > = values in this interval not displayed.   Liver Function Tests: Recent Labs    08/31/20 1106  AST 20  ALT 14  BILITOT 0.3  PROT 6.3   No results for input(s): LIPASE, AMYLASE in the last 8760 hours. No results for input(s): AMMONIA in the last 8760 hours. CBC: Recent Labs    08/31/20 1106 11/15/20 1013 11/25/20 0512 12/08/20 1538 01/03/21 0903  WBC 7.4   < > 8.7 8.2 8.1  NEUTROABS 4,884  --   --  4,715 4,690  HGB 10.6*   < > 7.9* 8.2* 9.6*  HCT  31.4*   < > 23.7* 25.4* 30.4*  MCV 88.2   < > 91.9 91.4 84.7  PLT 283   < > 189 622* 449*   < > = values in this interval not displayed.   Lipid Panel: No results for input(s): CHOL, HDL, LDLCALC, TRIG, CHOLHDL, LDLDIRECT in the last 8760 hours. TSH: Recent Labs    08/31/20 1106 01/03/21 0903  TSH 0.08* 3.72   A1C: No results found for: HGBA1C   Assessment/Plan 1. Pernicious anemia - B12 425 09/2019 - SYRINGE-NEEDLE, DISP, 3 ML (BD SAFETYGLIDE SYRINGE/NEEDLE) 25G X 1" 3 ML MISC; Inject 47ml in deltoid once monthly  Dispense: 100 each; Refill: 11 - cyanocobalamin (,VITAMIN B-12,) 1000 MCG/ML injection; INJECT 1 ML INTO THE MUSCLE EVERY 30 DAYS  Dispense: 30 mL; Refill: 11 - B12 level- future  2. Need for pneumococcal vaccination - Pneumococcal conjugate vaccine 20-valent (Prevnar 20)  3. Status post total replacement of right hip - followed by Dr. Ninfa Linden - WBAT, using cane - no pain medication at this time - cont muscle relaxer prn  4. Essential hypertension  -controlled - cont amlodipine  5. Age-related osteoporosis without current pathological fracture - DEXA never done - remains on calcium supplement - discussed need to have study done  6. Bipolar 1 disorder, depressed ([place]) - followed by psychotherapist - denies anxiety or mania - cont effexor  7. Hypothyroidism due to Hashimoto's thyroiditis - TSH 3.73 01/03/2021 - cont levothyroxine  8. Unsteady gait - improved since RATH - continue to work with PT - cont using cane  9. Postoperative anemia due to acute blood loss - EBL 350 cc  - hgb 9.6 01/03/2021 - cbc/diff- future  10. Diarrhea, unspecified type - recently seen by GI - benefiber started- reporting less incidents since starting  Total time: 31 minutes. Greater than 50% of total time spent doing patient education on health maintenance, vaccinations,   Future: Discuss need for DEXA scan, cbc/diff   Next appt: 04/14/2021  Windell Moulding,  [facility] [phone]

## 2021-01-25 ENCOUNTER — Ambulatory Visit: Payer: Medicare Other | Admitting: Gastroenterology

## 2021-02-04 ENCOUNTER — Telehealth: Payer: Self-pay | Admitting: *Deleted

## 2021-02-04 NOTE — Telephone Encounter (Signed)
Ortho bundle 30 day call and survey completed. ?

## 2021-02-14 ENCOUNTER — Ambulatory Visit (INDEPENDENT_AMBULATORY_CARE_PROVIDER_SITE_OTHER): Payer: Medicare Other | Admitting: Psychiatry

## 2021-02-14 ENCOUNTER — Other Ambulatory Visit: Payer: Self-pay

## 2021-02-14 DIAGNOSIS — F411 Generalized anxiety disorder: Secondary | ICD-10-CM

## 2021-02-14 NOTE — Progress Notes (Signed)
[facility] Counselor/Therapist Progress Note  Patient ID: Jackie Jones, MRN: [id number],    Date: 02/14/2021  Time Spent: 50 minutes   Treatment Type: Individual Therapy  Reported Symptoms: anxiety (improving)  Mental Status Exam:  Appearance:   Casual     Behavior:  Appropriate, Sharing, and Motivated  Motor:  Normal  Speech/Language:   Clear and Coherent  Affect:  anxious  Mood:  anxious  Thought process:  goal directed  Thought content:    WNL  Sensory/Perceptual disturbances:    WNL  Orientation:  oriented to person, place, time/date, situation, day of week, month of year, year, and stated date of Nov. 28, 2022  Attention:  Good  Concentration:  Good and Fair  Memory:  WNL  Fund of knowledge:   Good  Insight:    Good  Judgment:   Good  Impulse Control:  Good   Risk Assessment: Danger to Self:  No Self-injurious Behavior: No Danger to Others: No Duty to Warn:no Physical Aggression / Violence:No  Access to Firearms a concern: No  Gang Involvement:No   Subjective:  Patient in today reporting anxiety "but it has improved a little". Anxious about her finances, her own health, and the health of several family members. I have "a little depression too" focused on her finances, discouraged about her son in [place] who has significant health problems. Staying on her meds as prescribed by her Dr. Desiree Hane in her written "homework" involving a plan to help her get through the winter months as she shared last session her depression varies some seasonally. Discussed homework with patient which seemed helpful to her. Able to raise her window blinds high and uses her therapy light daily, which helps along with talking through her anxious/depressive thoughts.  Also very concerned about her adult son with his health concerns and behavioral health issues. Tries to set limits with what she can control and what she cannot control.  Trying not to "assume the winter months are going to be  really bad" even though that is the time a year she sometimes struggles with the most.  Is being more proactive as we worked together in coming up with strategies to manage the winter months better and trying to feel more encouraged about that.  Admits towards the end of session that she does feel a little more optimistic getting into this winter so is hoping that is a "good sign".  Interventions: Solution-Oriented/Positive Psychology, Ego-Supportive, and Insight-Oriented  Treatment Goal Plan: Patient not signing treatment goal plan on computer screen due to [place]. Treatment goals: Treatment goals remain on treatment plan as patient works with strategies to achieve her goals.  Progress will be assessed at each session and documented in the "progress" section of treatment note. Long-term goal: Reduce overall level, frequency, and intensity of the anxiety so that daily functioning is not as impaired. Short-term goal: Increase understanding of beliefs and messages that produce the worry and anxiety Strategies: Help the client develop reality-based, positive cognitive messages.   Diagnosis:   ICD-10-CM   1. Generalized anxiety disorder  F41.1       Plan:  Patient today showing good motivation and engagement in session as she focused on her anxiety which has been increased some due to personal stressors with her health and where she is living.  Encouraged patient and her practice of good self-care and positive behaviors including: Believing in herself more, asking for help when she needs it, having good boundaries and limits  with others as needed, saying no when she needs to say no, being safe in her moving about her apartment especially since her surgery several weeks ago, staying in touch with people who are supportive of her, walking as she is able, allowing for good sleep patterns, getting outside some a little each day as she is able, healthy nutrition, positive self talk, seeing the positives  within herself each day, developing the habit of looking for more positives versus negatives each day, and feel good about the strength she shows as she works with goal-directed behaviors to move in a direction that supports improved emotional health.  Goal review and progress/challenges noted with patient.  Next appointment within 4 to 5 weeks.  This record has been created using [employer].  Chart creation errors have been sought, but may not always have been located and corrected.  Such creation errors do not reflect on the standard of medical care provided.    Shanon Ace, LCSW

## 2021-02-21 ENCOUNTER — Encounter: Payer: Self-pay | Admitting: Gastroenterology

## 2021-02-21 ENCOUNTER — Ambulatory Visit (INDEPENDENT_AMBULATORY_CARE_PROVIDER_SITE_OTHER): Payer: Medicare Other | Admitting: Gastroenterology

## 2021-02-21 VITALS — BP 136/62 | HR 88 | Ht 62.75 in | Wt 139.2 lb

## 2021-02-21 DIAGNOSIS — I200 Unstable angina: Secondary | ICD-10-CM | POA: Diagnosis not present

## 2021-02-21 DIAGNOSIS — M6289 Other specified disorders of muscle: Secondary | ICD-10-CM

## 2021-02-21 DIAGNOSIS — R159 Full incontinence of feces: Secondary | ICD-10-CM | POA: Diagnosis not present

## 2021-02-21 DIAGNOSIS — K580 Irritable bowel syndrome with diarrhea: Secondary | ICD-10-CM | POA: Diagnosis not present

## 2021-02-21 NOTE — Patient Instructions (Signed)
We have referred you to Pelvic Floor Therapy and they will be contacting you with that appointment  Continue Benefiber and titrate based on response   If you are age 77 or older, your body mass index should be between 23-30. Your Body mass index is 24.86 kg/m. If this is out of the aforementioned range listed, please consider follow up with your Primary Care Provider.  If you are age 71 or younger, your body mass index should be between 19-25. Your Body mass index is 24.86 kg/m. If this is out of the aformentioned range listed, please consider follow up with your Primary Care Provider.   ________________________________________________________  The [facility] providers would like to encourage you to use [facility] to communicate with providers for non-urgent requests or questions.  Due to long hold times on the telephone, sending your provider a message by [facility] may be a faster and more efficient way to get a response.  Please allow 48 business hours for a response.  Please remember that this is for non-urgent requests.  _______________________________________________________   I appreciate the  opportunity to care for you  Thank You   Harl Bowie , MD

## 2021-02-21 NOTE — Progress Notes (Signed)
Jackie Jones    [id number]    1945-09-04  Primary Care Physician:[place], Mervyn Gay, NP  Referring Physician: Yvonna Alanis, NP [address],  [place] [address]   Chief complaint: Diarrhea  HPI:  75 year old very pleasant female here for follow-up visit for diarrhea   Overall she feels her symptoms have improved, she is no longer having significant diarrhea.  She is using Bene fiber. Denies any nocturnal diarrhea, mucus or blood in stool.   She had colonoscopy approximately 7 years ago in [facility], was normal except for mild colitis.  According to patient it was not ulcerative colitis.  She also had surgery for prolapsed rectum around the same time.   Denies any dysphagia, odynophagia, nausea, vomiting or abdominal pain.  No unintentional weight loss. No family history of GI malignancy.   She is s/p right hip replacement   Outpatient Encounter Medications as of 02/21/2021  Medication Sig   acetaminophen (TYLENOL) 500 MG tablet Take 1,000 mg by mouth in the morning and at bedtime.   alendronate (FOSAMAX) 70 MG tablet TAKE 1 TABLET(70 MG) BY MOUTH 1 TIME A WEEK WITH A FULL GLASS OF WATER AND ON AN EMPTY STOMACH   amLODipine (NORVASC) 10 MG tablet TAKE 1 TABLET(10 MG) BY MOUTH DAILY   aspirin 81 MG chewable tablet Chew 1 tablet (81 mg total) by mouth 2 (two) times daily.   Calcium-Vitamin D-Vitamin K (VIACTIV CALCIUM PLUS D) 650-12.5-40 MG-MCG-MCG CHEW Chew 2 tablets by mouth daily.   celecoxib (CELEBREX) 50 MG capsule Take 1 capsule (50 mg total) by mouth 2 (two) times daily.   cyanocobalamin (,VITAMIN B-12,) 1000 MCG/ML injection INJECT 1 ML INTO THE MUSCLE EVERY 30 DAYS   divalproex (DEPAKOTE ER) 500 MG 24 hr tablet Take 1 tablet (500 mg total) by mouth at bedtime.   Emollient (AVEENO RESTORATIVE SKIN THERAP) CREA Apply 1 application topically daily.   halobetasol (ULTRAVATE) 0.05 % ointment Apply 1 application topically daily as needed (eczema).    levothyroxine (SYNTHROID) 112 MCG tablet Take 1 tablet (112 mcg total) by mouth daily.   loratadine (CLARITIN) 10 MG tablet Take 10 mg by mouth daily.   Multiple Vitamins-Minerals (CENTRUM SILVER ADULT 50+ PO) Take 1 tablet by mouth daily.   Multiple Vitamins-Minerals (PRESERVISION AREDS) CAPS Take 1 capsule by mouth in the morning and at bedtime.   olmesartan (BENICAR) 20 MG tablet TAKE 1 TABLET(20 MG) BY MOUTH DAILY   Polyethyl Glycol-Propyl Glycol 0.4-0.3 % SOLN Place 1 drop into both eyes in the morning and at bedtime.   SYRINGE-NEEDLE, DISP, 3 ML (BD SAFETYGLIDE SYRINGE/NEEDLE) 25G X 1" 3 ML MISC Inject 93ml in deltoid once monthly   venlafaxine XR (EFFEXOR-XR) 150 MG 24 hr capsule Take 150 mg by mouth daily with breakfast.   Wheat Dextrin (BENEFIBER PO) Take by mouth. Take one teaspoon daily   [DISCONTINUED] methocarbamol (ROBAXIN) 500 MG tablet TAKE 1 TABLET(500 MG) BY MOUTH EVERY 6 HOURS AS NEEDED FOR MUSCLE SPASMS   [DISCONTINUED] tiZANidine (ZANAFLEX) 4 MG tablet TAKE 1 TABLET(4 MG) BY MOUTH EVERY 8 HOURS AS NEEDED FOR MUSCLE SPASMS   No facility-administered encounter medications on file as of 02/21/2021.    Allergies as of 02/21/2021 - Review Complete 02/21/2021  Allergen Reaction Noted   Contrast media [iodinated diagnostic agents] Itching 05/10/2020   Trintellix [vortioxetine] Other (See Comments) 04/15/2020   Dairycare [lactase-lactobacillus]  03/15/2020   Doxycycline hyclate Nausea  And Vomiting 11/09/2020   Soy allergy Itching 11/15/2020   Ciprofloxacin Rash 03/01/2018   Levaquin [levofloxacin] Rash 03/01/2018   Penicillins Rash 03/01/2018    Past Medical History:  Diagnosis Date   Allergy    Anxiety 2000   Arthritis    Bipolar 1 disorder, depressed ([place])    Bipolar disorder ([place] [facility] [place])    Chicken pox    Dementia ([place])    Depression    Dysrhythmia 05/10/2020   pt states "cardiologist told me it is not dangerous, it's an anomaly"   Hashimoto's thyroiditis    History of  blood transfusion    History of bone density study 2019   History of colonoscopy 2015   History of CT scan 2019   History of mammogram 2019   History of MRI    History of Papanicolaou smear of cervix    History of right hip replacement    Hx: UTI (urinary tract infection)    Hypertension    Hypothyroidism    Lactose intolerance    Legally blind in left eye, as defined in [place]    Non-celiac gluten sensitivity    Osteoporosis    Pernicious anemia    Pernicious anemia    Rheumatic fever    Thyroid disease    Urinary and fecal incontinence     Past Surgical History:  Procedure Laterality Date   [place]   EYE SURGERY Bilateral 2014   cataracts   LEFT HEART CATH AND CORONARY ANGIOGRAPHY N/A 05/10/2020   Procedure: LEFT HEART CATH AND CORONARY ANGIOGRAPHY;  Surgeon: Nelva Bush, MD;  Location: [facility];  Service: Cardiovascular;  Laterality: N/A;   prolapsed rectum 2015     TONSILLECTOMY AND ADENOIDECTOMY  1966   TOTAL HIP ARTHROPLASTY Right 11/23/2020   Procedure: RIGHT TOTAL HIP ARTHROPLASTY ANTERIOR APPROACH;  Surgeon: Mcarthur Rossetti, MD;  Location: [facility];  Service: Orthopedics;  Laterality: Right;   TUBAL LIGATION      Family History  Problem Relation Age of Onset   Heart disease Mother    Arthritis Mother    Cancer Father        bladder   Thyroid disease Father    Dementia Father    Hypertension Sister    Hypertension Brother    Hypertension Sister    Hypertension Sister    Diabetes Brother    Diabetes type II Son     Social History   Socioeconomic History   Marital status: Single    Spouse name: Not on file   Number of children: 1   Years of education: Not on file   Highest education level: Not on file  Occupational History   Not on file  Tobacco Use   Smoking status: Never   Smokeless tobacco: Never  Vaping Use   Vaping Use: Never used  Substance and Sexual Activity   Alcohol use:  Never   Drug use: Never   Sexual activity: Not Currently  Other Topics Concern   Not on file  Social History Narrative   Tobacco use, amount per day now: None.   Past tobacco use, amount per day: None.   How many years did you use tobacco: None.   Alcohol use (drinks per week): None.   Diet: I eat a low fat diet. Much in vegetables.    Do you drink/eat things with caffeine: Yes.   Marital status:  Divorced  What year were you married? 1976   Do you live in a house, apartment, assisted living, condo, trailer, etc.? Rent small house.   Is it one or more stories? One.   How many persons live in your home? I live alone.    Do you have pets in your home?( please list) No.   Highest Level of education completed? Masters Degree Plus.   Current or past profession: Pharmacist, hospital.   Do you exercise? Yes.                                  Type and how often? 5 days a week I walk.    Do you have a living will? Yes.   Do you have a DNR form?    Yes                               If not, do you want to discuss one?   Do you have signed POA/HPOA forms?  Yes.                      If so, please bring to you appointment      Do you have any difficulty bathing or dressing yourself? No.   Do you have any difficulty preparing food or eating? No.   Do you have any difficulty managing your medications? No.   Do you have any difficulty managing your finances? Yes.   Do you have any difficulty affording your medications? No.   Social Determinants of Health   Financial Resource Strain: Not on file  Food Insecurity: Not on file  Transportation Needs: Not on file  Physical Activity: Not on file  Stress: Not on file  Social Connections: Not on file  Intimate Partner Violence: Not on file      Review of systems: All other review of systems negative except as mentioned in the HPI.   Physical Exam: Vitals:   02/21/21 0904  BP: 136/62  Pulse: 88   Body mass index is 24.86  kg/m. Gen:      No acute distress HEENT:  sclera anicteric Abd:      soft, non-tender; no palpable masses, no distension Ext:    No edema Neuro: alert and oriented x 3 Psych: normal mood and affect  Data Reviewed:  Reviewed labs, radiology imaging, old records and pertinent past GI work up   Assessment and Plan/Recommendations:  75 year old very pleasant female with complaints of chronic intermittent diarrhea, overall improved  GI pathogen panel negative for any GI infection and also fecal lactoferrin insert negative for any sign of colonic inflammation or colitis She has family history of celiac disease, negative for celiac antibody start Benefiber 1 tablespoon 3 times daily with meals  Intermittent diarrhea likely secondary to IBS: Continue Benefiber 1 teaspoon 2-3 times daily with meals and titrate based on response to have soft 1-2 formed bowel movements daily  Fecal urgency and incontinence.  Will refer to pelvic floor physical therapy for pelvic floor dysfunction and to improve anal sphincter tone  Return in 4 to 6 months   The patient was provided an opportunity to ask questions and all were answered. The patient agreed with the plan and demonstrated an understanding of the instructions.  Damaris Hippo , MD    CC: Yvonna Alanis, NP

## 2021-02-28 ENCOUNTER — Encounter: Payer: Self-pay | Admitting: Physical Therapy

## 2021-02-28 ENCOUNTER — Other Ambulatory Visit: Payer: Self-pay

## 2021-02-28 ENCOUNTER — Ambulatory Visit: Payer: Medicare Other | Attending: Gastroenterology | Admitting: Physical Therapy

## 2021-02-28 DIAGNOSIS — R269 Unspecified abnormalities of gait and mobility: Secondary | ICD-10-CM | POA: Diagnosis not present

## 2021-02-28 DIAGNOSIS — R279 Unspecified lack of coordination: Secondary | ICD-10-CM | POA: Diagnosis not present

## 2021-02-28 DIAGNOSIS — M6281 Muscle weakness (generalized): Secondary | ICD-10-CM | POA: Diagnosis not present

## 2021-02-28 DIAGNOSIS — R293 Abnormal posture: Secondary | ICD-10-CM | POA: Diagnosis not present

## 2021-02-28 NOTE — Therapy (Signed)
[facility] @ [place], [place], [address] Phone: [phone]   Fax:  [phone]  Physical Therapy Evaluation  Patient Details  Name: Jackie Jones MRN: [id number] Date of Birth: 24-Nov-1945 Referring Provider (PT): Mauri Pole, MD   Encounter Date: 02/28/2021   PT End of Session - 02/28/21 1233     Visit Number 1    Date for PT Re-Evaluation 05/29/21    Authorization Type Medicare    Progress Note Due on Visit 10    PT Start Time 4034    PT Stop Time 1228    PT Time Calculation (min) 43 min    Activity Tolerance Patient tolerated treatment well    Behavior During Therapy [facility] for tasks assessed/performed             Past Medical History:  Diagnosis Date   Allergy    Anxiety 2000   Arthritis    Bipolar 1 disorder, depressed ([place])    Bipolar disorder ([place])    Chicken pox    Dementia ([place])    Depression    Dysrhythmia 05/10/2020   pt states "cardiologist told me it is not dangerous, it's an anomaly"   Hashimoto's thyroiditis    History of blood transfusion    History of bone density study 2019   History of colonoscopy 2015   History of CT scan 2019   History of mammogram 2019   History of MRI    History of Papanicolaou smear of cervix    History of right hip replacement    Hx: UTI (urinary tract infection)    Hypertension    Hypothyroidism    Lactose intolerance    Legally blind in left eye, as defined in [place]    Non-celiac gluten sensitivity    Osteoporosis    Pernicious anemia    Pernicious anemia    Rheumatic fever    Thyroid disease    Urinary and fecal incontinence     Past Surgical History:  Procedure Laterality Date   [place] Bilateral 2014   cataracts   LEFT HEART CATH AND CORONARY ANGIOGRAPHY N/A 05/10/2020   Procedure: LEFT HEART CATH AND CORONARY ANGIOGRAPHY;  Surgeon: Nelva Bush, MD;  Location:  [facility];  Service: Cardiovascular;  Laterality: N/A;   prolapsed rectum 2015     TONSILLECTOMY AND ADENOIDECTOMY  1966   TOTAL HIP ARTHROPLASTY Right 11/23/2020   Procedure: RIGHT TOTAL HIP ARTHROPLASTY ANTERIOR APPROACH;  Surgeon: Mcarthur Rossetti, MD;  Location: [facility];  Service: Orthopedics;  Laterality: Right;   TUBAL LIGATION      There were no vitals filed for this visit.    Subjective Assessment - 02/28/21 1150     Subjective Pt reports she has a chronic history of fecal and urinary incontinence for years. Pt reports she has strong urge to urinate and able to make it to the bathroom quick enough if she knows where one is and feels she has more control over this than stool, pt reports she leaks with coughing/sneezing and has increased frequency to every 30 mins. Pt reports she cuts down on water intake to try to decrease time to go to the bathroom. Pt does wear diapers during the day and night and needs to change once per day usually due to fecal incontinence. Pt does urinate 1x per hour at night as well, if  she sleeps longer  than this and then gets up to urinate will usually have full loss of urine walking to bathroom. Pt reports total loss of bowels with coughing and sneezing, if she attempts to kegal preventively then she can stop it enough to make it to the bathroom if at home. Pt also has no warning with urge to have a bowel movement and will have total loss of bowels if unable to make it to toliet quickly enough. Pt also reports vaginal prolapse. Pt enjoys walking outside as her main source of exercise and has been doing this up to 4 miles per day without accidents but did not drink any water.    Pertinent History vaginal prolapse, surgery for rectal prolapse repair, hysterectomy, right hip replacement anterior 11/23/20, pernicious anemia    How long can you sit comfortably? no limits    How long can you stand comfortably? no limits    How long can you walk comfortably? no  limits    Patient Stated Goals to have less leakage    Currently in Pain? No/denies                [facility] PT Assessment - 02/28/21 0001       Assessment   Medical Diagnosis R15.9 (ICD-10-CM) - Incontinence of feces, unspecified fecal incontinence type  M62.89 (ICD-10-CM) - Pelvic floor dysfunction    Referring Provider (PT) Mauri Pole, MD    Onset Date/Surgical Date --   for years   Prior Therapy yes for recent Rt hip replacement      Precautions   Precautions Anterior Hip;Other (comment)    Precaution Comments surgery 9/622      Restrictions   Weight Bearing Restrictions No      Balance Screen   Has the patient fallen in the past 6 months No    Has the patient had a decrease in activity level because of a fear of falling?  No    Is the patient reluctant to leave their home because of a fear of falling?  No      Home Ecologist residence    Living Arrangements Alone      Prior Function   Level of Independence Independent      Cognition   Overall Cognitive Status Within Functional Limits for tasks assessed      Sensation   Light Touch Appears Intact      Coordination   Gross Motor Movements are Fluid and Coordinated Yes    Fine Motor Movements are Fluid and Coordinated Yes      Posture/Postural Control   Posture/Postural Control Postural limitations    Postural Limitations Rounded Shoulders;Increased thoracic kyphosis;Posterior pelvic tilt      ROM / Strength   AROM / PROM / Strength AROM;Strength      AROM   Overall AROM Comments thoracic and lumbar spine limited in flexion by 25% and side bending and rotation by25%      Strength   Overall Strength Comments bil hips grossly 4/5 in all directions no pain      Flexibility   Soft Tissue Assessment /Muscle Length yes   hamstrings limited by 25% bil     Palpation   Palpation comment no TTP                        Objective measurements completed on  examination: See above findings.     Pelvic Floor Special  Questions - 02/28/21 0001     Prior Pelvic/Prostate Exam No    Are you Pregnant or attempting pregnancy? No    Prior Pregnancies No    Currently Sexually Active No    History of sexually transmitted disease No    Urinary Leakage Yes    How often daily, a few times per day and night    Pad use one diaper per day and one at night    Activities that cause leaking With strong urge;Coughing;Sneezing;Laughing    Urinary urgency Yes    Urinary frequency every 30 mins    Fecal incontinence Yes   full loss of bowels with stressors and with strong urge. usually ~12 small BMs per day every times she urinates. Usually type 3-4 however will have type 6 pending on diet.   Fluid intake at least 64 oz per day    Caffeine beverages 4 6oz cups of coffee in AM only.    Falling out feeling (prolapse) Yes    Activities that cause feeling of prolapse feels a bulge with sitting    Pelvic Floor Internal Exam deferred at eval at pt request                       PT Education - 02/28/21 1233     Education Details Pt educated on exam findings, POC, bladder irritants, types of fiber, voiding mechanics, and the knack method    Person(s) Educated Patient    Methods Explanation;Demonstration;Tactile cues;Verbal cues;Handout    Comprehension Verbalized understanding;Returned demonstration              PT Short Term Goals - 02/28/21 1401       PT SHORT TERM GOAL #1   Title Pt to be I with HEP    Time 5    Period Weeks    Status New    Target Date 04/04/21      PT SHORT TERM GOAL #2   Title pt to report no more than 3 urinary leaks per day to improve symptoms for QOL.    Time 5    Period Weeks    Status New    Target Date 04/04/21      PT SHORT TERM GOAL #3   Title pt to report no more than 6 BMs per day and no more than 1 Loss of bowels per day for improved QOL.    Time 5    Period Weeks    Status New    Target Date  04/04/21      PT SHORT TERM GOAL #4   Title pt to demonstrate at least 3/5 pelvic floor strength for improved pelvic stability and decreased incontinence symptoms.    Time 5    Period Weeks    Status New    Target Date 04/04/21               PT Long Term Goals - 02/28/21 1404       PT LONG TERM GOAL #1   Title Pt to be I with advanced HEP    Time 3    Period Months    Status New    Target Date 05/29/21      PT LONG TERM GOAL #2   Title pt to report no more than 2 urinary leaks per day to improve symptoms for QOL.    Time 3    Period Months    Status New    Target Date 05/29/21  PT LONG TERM GOAL #3   Title pt to report no more than 3 BMs per day and no more than 1 Loss of bowels per month for improved QOL.    Time 3    Period Months    Status New    Target Date 05/29/21      PT LONG TERM GOAL #4   Title pt to demonstrate at least 4/5 pelvic floor strength and at least 10s hold for improved pelvic stability and decreased incontinence symptoms.    Time 3    Period Months    Status New    Target Date 05/29/21      PT LONG TERM GOAL #5   Title pt to demonstrate improved pressure management and pelvic floor coordination with functional activities at leat 75% of the time for decreased stess on pelvic floor and less leakage.    Time 3    Period Months    Target Date 05/29/21                    Plan - 02/28/21 1234     Clinical Impression Statement Pt is 75yo female presenting to clinic with years long chronic history of urinary and fecal incontinence. Pt reports she does feel anxious about leaving home due to fear of having an accident in public. Pt reports she has a chronic history of fecal and urinary incontinence for years. Pt reports she has strong urge to urinate and able to make it to the bathroom quick enough if she knows where one is and feels she has more control over this than stool, pt reports she leaks with coughing/sneezing and has  increased frequency to every 30 mins. Pt reports she cuts down on water intake to try to decrease time to go to the bathroom. Pt does wear diapers during the day and night and needs to change once per day usually due to fecal incontinence. Pt does urinate 1x per hour at night as well, if she sleeps longer than this and then gets up to urinate will usually have full loss of urine walking to bathroom. Pt reports total loss of bowels with coughing and sneezing, if she attempts to kegal preventively then she can stop it enough to make it to the bathroom if at home. Pt also has no warning with urge to have a bowel movement and will have total loss of bowels if unable to make it to toliet quickly enough. Pt also reports vaginal prolapse. Pt enjoys walking outside as her main source of exercise and has been doing this up to 4 miles per day without accidents but did not drink any water. Pt found to have mild decreased mobility in thoracic and lumbar spine, decreased strength in bil hips, and deferred internal assessment this date. Pt educated on pelvic floor musculature, female anatomy with model used, and bladder irritants and voiding mechanics.    Personal Factors and Comorbidities Time since onset of injury/illness/exacerbation    Examination-Activity Limitations Continence;Hygiene/Grooming;Lift;Toileting;Bend;Locomotion Level;Squat    Examination-Participation Restrictions Community Activity;Interpersonal Relationship    Stability/Clinical Decision Making Stable/Uncomplicated    Clinical Decision Making Low    Rehab Potential Good    PT Frequency 1x / week    PT Duration Other (comment)   10 visits   PT Treatment/Interventions ADLs/Self Care Home Management;Functional mobility training;Therapeutic activities;Therapeutic exercise;Neuromuscular re-education;Manual techniques;Patient/family education;Taping;Scar mobilization;Passive range of motion;Energy conservation    PT Next Visit Plan go over all handouts,  bladder retraining, possibly internal?  Consulted and Agree with Plan of Care Patient             Patient will benefit from skilled therapeutic intervention in order to improve the following deficits and impairments:  Decreased coordination, Decreased endurance, Difficulty walking, Improper body mechanics, Impaired flexibility, Decreased mobility, Decreased strength, Postural dysfunction  Visit Diagnosis: Muscle weakness (generalized) - Plan: PT plan of care cert/re-cert  Lack of coordination - Plan: PT plan of care cert/re-cert  Abnormality of gait and mobility - Plan: PT plan of care cert/re-cert  Abnormal posture - Plan: PT plan of care cert/re-cert     Problem List Patient Active Problem List   Diagnosis Date Noted   Unilateral primary osteoarthritis, right hip 11/23/2020   Status post total replacement of right hip 11/23/2020   Palpitations 05/21/2020   Coronary artery disease involving native coronary artery of native heart without angina pectoris 05/21/2020   Near syncope 05/05/2020   Seasonal affective disorder ([place]) 04/22/2020   Bipolar 1 disorder, depressed ([place]) 03/01/2018   HTN (hypertension) 03/01/2018   Hypothyroid 03/01/2018   Pernicious anemia 03/01/2018   Osteoporosis 03/01/2018   Macular degeneration 03/01/2018   Stacy Gardner, PT, DPT 12/12/222:07 PM   [facility] @ [place], [place], [address] Phone: [phone]   Fax:  [phone]  Name: CATRIONA DILLENBECK MRN: [id number] Date of Birth: 04-10-1945

## 2021-02-28 NOTE — Patient Instructions (Addendum)
Types of Fiber  There are two main types of fiber:  insoluble and soluble.  Both of these types can prevent and relieve constipation and diarrhea, although some people find one or the other to be more easily digested.  This handout details information about both types of fiber.  Insoluble Fiber       Functions of Insoluble Fiber moves bulk through the intestines  controls and balances the pH (acidity) in the intestines       Benefits of Insoluble Fiber promotes regular bowel movement and prevents constipation  removes fecal waste through colon in less time  keeps an optimal pH in intestines to prevent microbes from producing cancer substances, therefore preventing colon cancer        Food Sources of Insoluble Fiber whole-wheat products  wheat bran "miller's bran" corn bran  flax seed or other seeds vegetables such as green beans, broccoli, cauliflower and potato skins  fruit skins and root vegetable skins  popcorn brown rice  Soluble Fiber       Functions of Soluble Fiber  holds water in the colon to bulk and soften the stool prolongs stomach emptying time so that sugar is released and absorbed more slowly        Benefits of Soluble Fiber lowers total cholesterol and LDL cholesterol (the bad cholesterol) therefore reducing the risk of heart disease  regulates blood sugar for people with diabetes       Food Sources of Soluble Fiber oat/oat bran dried beans and peas  nuts  barley  flax seed or other seeds fruits such as oranges, pears, peaches, and apples  vegetables such as carrots  psyllium husk  prunes      Bladder Irritants  Certain foods and beverages can be irritating to the bladder.  Avoiding these irritants may decrease your symptoms of urinary urgency, frequency or bladder pain.  Even reducing your intake can help with your symptoms.  Not everyone is sensitive to all bladder irritants, so you may consider focusing on one irritant at a time, removing or  reducing your intake of that irritant for 7-10 days to see if this change helps your symptoms.  Water intake is also very important.  Below is a list of bladder irritants.  Drinks: alcohol, carbonated beverages, caffeinated beverages such as coffee and tea, drinks with artificial sweeteners, citrus juices, apple juice, tomato juice  Foods: tomatoes and tomato based foods, spicy food, sugar and artificial sweeteners, vinegar, chocolate, raw onion, apples, citrus fruits, pineapple, cranberries, tomatoes, strawberries, plums, peaches, cantaloupe  Other: acidic urine (too concentrated) - see water intake info below  Substitutes you can try that are NOT irritating to the bladder: cooked onion, pears, papayas, sun-brewed decaf teas, watermelons, non-citrus herbal teas, apricots, kava and low-acid instant drinks (Postum).    WATER INTAKE: Remember to drink lots of water (aim for fluid intake of half your body weight with 2/3 of fluids being water).  You may be limiting fluids due to fear of leakage, but this can actually worsen urgency symptoms due to highly concentrated urine.  Water helps balance the pH of your urine so it doesn't become too acidic - acidic urine is a bladder irritant!   THE KNACK  The Knack is a strategy you may use to help to reduce or prevent leakage or passing of urine, gas or feces during an activity that causes downward force on the pelvic floor muscles.    Activities that can cause downward pressure on the pelvic floor  muscles include coughing, sneezing, laughing, bending, lifting, and transitioning from different body positions such as from laying down to sitting up and sitting to standing.  To perform The Knack, consciously squeeze and lift your pelvic floor muscles to perform a strong, well-timed pelvic muscle contraction BEFORE AND DURING these activities above.  As your contraction gets more coordinated and your muscles get stronger, you will become more effective in  controlling your experience of incontinence or gas passing during these activities.

## 2021-03-06 ENCOUNTER — Encounter: Payer: Self-pay | Admitting: Gastroenterology

## 2021-03-07 DIAGNOSIS — M792 Neuralgia and neuritis, unspecified: Secondary | ICD-10-CM | POA: Diagnosis not present

## 2021-03-07 DIAGNOSIS — I739 Peripheral vascular disease, unspecified: Secondary | ICD-10-CM | POA: Diagnosis not present

## 2021-03-07 DIAGNOSIS — B351 Tinea unguium: Secondary | ICD-10-CM | POA: Diagnosis not present

## 2021-03-08 ENCOUNTER — Ambulatory Visit: Payer: Medicare Other | Admitting: Physical Therapy

## 2021-03-08 ENCOUNTER — Other Ambulatory Visit: Payer: Self-pay

## 2021-03-08 DIAGNOSIS — M6281 Muscle weakness (generalized): Secondary | ICD-10-CM

## 2021-03-08 DIAGNOSIS — R279 Unspecified lack of coordination: Secondary | ICD-10-CM

## 2021-03-08 DIAGNOSIS — R293 Abnormal posture: Secondary | ICD-10-CM | POA: Diagnosis not present

## 2021-03-08 DIAGNOSIS — R269 Unspecified abnormalities of gait and mobility: Secondary | ICD-10-CM | POA: Diagnosis not present

## 2021-03-08 NOTE — Therapy (Signed)
[facility] @ [place], [place], [address] Phone: [phone]   Fax:  [phone]  Physical Therapy Treatment  Patient Details  Name: Jackie Jones MRN: [id number] Date of Birth: 10-04-1945 Referring Provider (PT): Mauri Pole, MD   Encounter Date: 03/08/2021   PT End of Session - 03/08/21 0913     Visit Number 2    Date for PT Re-Evaluation 05/29/21    Authorization Type Medicare    Progress Note Due on Visit 10    PT Start Time 0845    PT Stop Time 0925    PT Time Calculation (min) 40 min    Activity Tolerance Patient tolerated treatment well    Behavior During Therapy [facility] for tasks assessed/performed             Past Medical History:  Diagnosis Date   Allergy    Anxiety 2000   Arthritis    Bipolar 1 disorder, depressed ([place])    Bipolar disorder ([place])    Chicken pox    Dementia ([place])    Depression    Dysrhythmia 05/10/2020   pt states "cardiologist told me it is not dangerous, it's an anomaly"   Hashimoto's thyroiditis    History of blood transfusion    History of bone density study 2019   History of colonoscopy 2015   History of CT scan 2019   History of mammogram 2019   History of MRI    History of Papanicolaou smear of cervix    History of right hip replacement    Hx: UTI (urinary tract infection)    Hypertension    Hypothyroidism    Lactose intolerance    Legally blind in left eye, as defined in [place]    Non-celiac gluten sensitivity    Osteoporosis    Pernicious anemia    Pernicious anemia    Rheumatic fever    Thyroid disease    Urinary and fecal incontinence     Past Surgical History:  Procedure Laterality Date   [place] Bilateral 2014   cataracts   LEFT HEART CATH AND CORONARY ANGIOGRAPHY N/A 05/10/2020   Procedure: LEFT HEART CATH AND CORONARY ANGIOGRAPHY;  Surgeon: Nelva Bush, MD;  Location:  [facility];  Service: Cardiovascular;  Laterality: N/A;   prolapsed rectum 2015     TONSILLECTOMY AND ADENOIDECTOMY  1966   TOTAL HIP ARTHROPLASTY Right 11/23/2020   Procedure: RIGHT TOTAL HIP ARTHROPLASTY ANTERIOR APPROACH;  Surgeon: Mcarthur Rossetti, MD;  Location: [facility];  Service: Orthopedics;  Laterality: Right;   TUBAL LIGATION      There were no vitals filed for this visit.   Subjective Assessment - 03/08/21 0846     Subjective Pt reports she feels the urinary incontinence has been about the same but the fecal incontinence                            Pelvic Floor Special Questions - 03/08/21 0001     Pelvic Floor Internal Exam patient identified and patient confirms consent for PT to perform internal soft tissue work and muscle strength and integrity assessment    Exam Type Rectal    Sensation WFL    Palpation no TTP    Strength fair squeeze, definite lift    Strength # of reps 6  Strength # of seconds 5    Tone decreased, especially at [facility] Adult PT Treatment/Exercise - 03/08/21 0001       Self-Care   Self-Care Other Self-Care Comments    Other Self-Care Comments  Pt educated on voiding mechanics, HEP, and proper technique for pelvic floor contractions.      Manual Therapy   Manual Therapy Internal Pelvic Floor    Manual therapy comments manual work for internal rectal assessment and treatment. Pt directed in 2x5 pelvic floor contractions, x5 isometric holds for 1-61W, R60 quick flicks                     PT Education - 03/08/21 0913     Education Details Pt educated on pelvic floor assessment findings, HEP, proper mechanics for voiding and pelvic floor contractions.    Person(s) Educated Patient    Methods Explanation;Demonstration;Tactile cues;Verbal cues;Handout    Comprehension Verbalized understanding;Returned demonstration              PT Short Term Goals - 02/28/21 1401       PT  SHORT TERM GOAL #1   Title Pt to be I with HEP    Time 5    Period Weeks    Status New    Target Date 04/04/21      PT SHORT TERM GOAL #2   Title pt to report no more than 3 urinary leaks per day to improve symptoms for QOL.    Time 5    Period Weeks    Status New    Target Date 04/04/21      PT SHORT TERM GOAL #3   Title pt to report no more than 6 BMs per day and no more than 1 Loss of bowels per day for improved QOL.    Time 5    Period Weeks    Status New    Target Date 04/04/21      PT SHORT TERM GOAL #4   Title pt to demonstrate at least 3/5 pelvic floor strength for improved pelvic stability and decreased incontinence symptoms.    Time 5    Period Weeks    Status New    Target Date 04/04/21               PT Long Term Goals - 02/28/21 1404       PT LONG TERM GOAL #1   Title Pt to be I with advanced HEP    Time 3    Period Months    Status New    Target Date 05/29/21      PT LONG TERM GOAL #2   Title pt to report no more than 2 urinary leaks per day to improve symptoms for QOL.    Time 3    Period Months    Status New    Target Date 05/29/21      PT LONG TERM GOAL #3   Title pt to report no more than 3 BMs per day and no more than 1 Loss of bowels per month for improved QOL.    Time 3    Period Months    Status New    Target Date 05/29/21      PT LONG TERM GOAL #4   Title pt to demonstrate at least 4/5 pelvic floor strength and at least 10s hold for improved pelvic stability and decreased  incontinence symptoms.    Time 3    Period Months    Status New    Target Date 05/29/21      PT LONG TERM GOAL #5   Title pt to demonstrate improved pressure management and pelvic floor coordination with functional activities at leat 75% of the time for decreased stess on pelvic floor and less leakage.    Time 3    Period Months    Target Date 05/29/21                   Plan - 03/08/21 0914     Clinical Impression Statement Pt presents to  clinic reporting she hasn't noticed many differences in symptoms but requesting to perform rectal assessment this session. Pt consented to this, and found to have decreased strength, endurance, and mild decreased coordination. Pt demonstrated 3/5 strength at puborectalis and 2/5 at EAS and needed cues to decrease compensatory strategies with glutes and holding breath, then able to do this. Pt required extra time for quick flicks and <17O isometrics. Pt given HEP for pelvic floor strengthening and went over during session, pt denied additional questions. Pt denied pain throughout and reported having a better understanding of how to contract pelvic floor at end of session. Pt would benefit from additional PT to further address deficits and improve QOL.    Personal Factors and Comorbidities Time since onset of injury/illness/exacerbation    Examination-Activity Limitations Continence;Hygiene/Grooming;Lift;Toileting;Bend;Locomotion Level;Squat    Examination-Participation Restrictions Community Activity;Interpersonal Relationship    Stability/Clinical Decision Making Stable/Uncomplicated    Rehab Potential Good    PT Frequency 1x / week    PT Duration Other (comment)   10 visits   PT Treatment/Interventions ADLs/Self Care Home Management;Functional mobility training;Therapeutic activities;Therapeutic exercise;Neuromuscular re-education;Manual techniques;Patient/family education;Taping;Scar mobilization;Passive range of motion;Energy conservation    PT Next Visit Plan go over voiding mechanics, bladder retraining    PT Home Exercise Plan [id number]    Consulted and Agree with Plan of Care Patient             Patient will benefit from skilled therapeutic intervention in order to improve the following deficits and impairments:  Decreased coordination, Decreased endurance, Difficulty walking, Improper body mechanics, Impaired flexibility, Decreased mobility, Decreased strength, Postural dysfunction  Visit  Diagnosis: Muscle weakness (generalized)  Lack of coordination  Abnormal posture     Problem List Patient Active Problem List   Diagnosis Date Noted   Unilateral primary osteoarthritis, right hip 11/23/2020   Status post total replacement of right hip 11/23/2020   Palpitations 05/21/2020   Coronary artery disease involving native coronary artery of native heart without angina pectoris 05/21/2020   Near syncope 05/05/2020   Seasonal affective disorder ([place]) 04/22/2020   Bipolar 1 disorder, depressed ([place]) 03/01/2018   HTN (hypertension) 03/01/2018   Hypothyroid 03/01/2018   Pernicious anemia 03/01/2018   Osteoporosis 03/01/2018   Macular degeneration 03/01/2018    No emotional/communication barriers or cognitive limitation. Patient is motivated to learn. Patient understands and agrees with treatment goals and plan. PT explains patient will be examined in standing, sitting, and lying down to see how their muscles and joints work. When they are ready, they will be asked to remove their underwear so PT can examine their perineum. The patient is also given the option of providing their own chaperone as one is not provided in our facility. The patient also has the right and is explained the right to defer or refuse any part of the evaluation or  treatment including the internal exam. With the patient's consent, PT will use one gloved finger to gently assess the muscles of the pelvic floor, seeing how well it contracts and relaxes and if there is muscle symmetry. After, the patient will get dressed and PT and patient will discuss exam findings and plan of care. PT and patient discuss plan of care, schedule, attendance policy and HEP activities.   Stacy Gardner, PT, DPT 12/20/229:27 AM   [facility] @ [place], [place], [address] Phone: [phone]   Fax:  [phone]  Name: CELISSE CIULLA MRN: [id number] Date of Birth:  Jun 03, 1945

## 2021-03-09 ENCOUNTER — Telehealth: Payer: Self-pay | Admitting: *Deleted

## 2021-03-09 NOTE — Telephone Encounter (Signed)
90 day Ortho bundle call completed. 

## 2021-03-31 ENCOUNTER — Ambulatory Visit (INDEPENDENT_AMBULATORY_CARE_PROVIDER_SITE_OTHER): Payer: Medicare Other | Admitting: Psychiatry

## 2021-03-31 ENCOUNTER — Ambulatory Visit: Payer: Medicare Other | Attending: Gastroenterology | Admitting: Physical Therapy

## 2021-03-31 ENCOUNTER — Other Ambulatory Visit: Payer: Self-pay

## 2021-03-31 ENCOUNTER — Encounter: Payer: Self-pay | Admitting: Physical Therapy

## 2021-03-31 DIAGNOSIS — F411 Generalized anxiety disorder: Secondary | ICD-10-CM

## 2021-03-31 DIAGNOSIS — R293 Abnormal posture: Secondary | ICD-10-CM

## 2021-03-31 DIAGNOSIS — M6281 Muscle weakness (generalized): Secondary | ICD-10-CM | POA: Diagnosis not present

## 2021-03-31 DIAGNOSIS — R279 Unspecified lack of coordination: Secondary | ICD-10-CM | POA: Diagnosis not present

## 2021-03-31 NOTE — Therapy (Addendum)
[facility] @ [address] [place], [place], [address] Phone: [phone]   Fax:  [phone]  Physical Therapy Treatment  Patient Details  Name: Jackie Jones MRN: [id number] Date of Birth: October 27, 1945 Referring Provider (PT): Mauri Pole, MD   Encounter Date: 03/31/2021   PT End of Session - 03/31/21 0809     Visit Number 3    Date for PT Re-Evaluation 05/29/21    Authorization Type Medicare    Progress Note Due on Visit 10    PT Start Time 0800    PT Stop Time 7673    PT Time Calculation (min) 43 min    Activity Tolerance Patient tolerated treatment well    Behavior During Therapy [facility] for tasks assessed/performed             Past Medical History:  Diagnosis Date   Allergy    Anxiety 2000   Arthritis    Bipolar 1 disorder, depressed ([place])    Bipolar disorder ([place])    Chicken pox    Dementia ([place])    Depression    Dysrhythmia 05/10/2020   pt states "cardiologist told me it is not dangerous, it's an anomaly"   Hashimoto's thyroiditis    History of blood transfusion    History of bone density study 2019   History of colonoscopy 2015   History of CT scan 2019   History of mammogram 2019   History of MRI    History of Papanicolaou smear of cervix    History of right hip replacement    Hx: UTI (urinary tract infection)    Hypertension    Hypothyroidism    Lactose intolerance    Legally blind in left eye, as defined in [place]    Non-celiac gluten sensitivity    Osteoporosis    Pernicious anemia    Pernicious anemia    Rheumatic fever    Thyroid disease    Urinary and fecal incontinence     Past Surgical History:  Procedure Laterality Date   [place] Bilateral 2014   cataracts   LEFT HEART CATH AND CORONARY ANGIOGRAPHY N/A 05/10/2020   Procedure: LEFT HEART CATH AND CORONARY ANGIOGRAPHY;  Surgeon: Nelva Bush, MD;  Location: [facility];  Service: Cardiovascular;  Laterality: N/A;   prolapsed rectum 2015     TONSILLECTOMY AND ADENOIDECTOMY  1966   TOTAL HIP ARTHROPLASTY Right 11/23/2020   Procedure: RIGHT TOTAL HIP ARTHROPLASTY ANTERIOR APPROACH;  Surgeon: Mcarthur Rossetti, MD;  Location: [facility];  Service: Orthopedics;  Laterality: Right;   TUBAL LIGATION      There were no vitals filed for this visit.   Subjective Assessment - 03/31/21 0801     Subjective Pt reports exercises are helping and she feels like she has more control. She states that she is having less smearing and leakage while she is walking; she can walk a long way without problems or hip pain. She is having no issues with urinary incontinence. She has tried [employer] and reports no benefit. 1 pad a day and no problems at night.    Currently in Pain? No/denies                               [facility] Adult PT Treatment/Exercise - 03/31/21 0001  Exercises   Exercises Lumbar      Lumbar Exercises: Stretches   Active Hamstring Stretch Right;Left;2 reps;10 seconds    Other Lumbar Stretch Exercise Seated lumbar flexion 2x; seated spinal twist 2x bil;    Other Lumbar Stretch Exercise Seated bil upper extremity flexion, 10x      Lumbar Exercises: Aerobic   Nustep Level 5, 5 min      Lumbar Exercises: Standing   Other Standing Lumbar Exercises Pallof press, red band, VCs for core facilitation and appropriate breath coordination 2 x 10 Lt.      Lumbar Exercises: Seated   Sit to Stand 20 reps   3lb bil; VCs for wide knees and exhale with rise   Other Seated Lumbar Exercises Pelvic floor muscle contractions, quick flicks, 58K with breath coordination; pelvic floor muscle contractions, long holds, 10 x 10 sec    Other Seated Lumbar Exercises Horizontal abduction with core facilitation, red band, 2 x 10; Seated resisted march with diagonal resistance, 20x; seated hip abduction with red band 10x                        PT Short Term Goals - 02/28/21 1401       PT SHORT TERM GOAL #1   Title Pt to be I with HEP    Time 5    Period Weeks    Status New    Target Date 04/04/21      PT SHORT TERM GOAL #2   Title pt to report no more than 3 urinary leaks per day to improve symptoms for QOL.    Time 5    Period Weeks    Status New    Target Date 04/04/21      PT SHORT TERM GOAL #3   Title pt to report no more than 6 BMs per day and no more than 1 Loss of bowels per day for improved QOL.    Time 5    Period Weeks    Status New    Target Date 04/04/21      PT SHORT TERM GOAL #4   Title pt to demonstrate at least 3/5 pelvic floor strength for improved pelvic stability and decreased incontinence symptoms.    Time 5    Period Weeks    Status New    Target Date 04/04/21               PT Long Term Goals - 02/28/21 1404       PT LONG TERM GOAL #1   Title Pt to be I with advanced HEP    Time 3    Period Months    Status New    Target Date 05/29/21      PT LONG TERM GOAL #2   Title pt to report no more than 2 urinary leaks per day to improve symptoms for QOL.    Time 3    Period Months    Status New    Target Date 05/29/21      PT LONG TERM GOAL #3   Title pt to report no more than 3 BMs per day and no more than 1 Loss of bowels per month for improved QOL.    Time 3    Period Months    Status New    Target Date 05/29/21      PT LONG TERM GOAL #4   Title pt to demonstrate at least 4/5 pelvic floor strength  and at least 10s hold for improved pelvic stability and decreased incontinence symptoms.    Time 3    Period Months    Status New    Target Date 05/29/21      PT LONG TERM GOAL #5   Title pt to demonstrate improved pressure management and pelvic floor coordination with functional activities at leat 75% of the time for decreased stess on pelvic floor and less leakage.    Time 3    Period Months    Target Date 05/29/21                    Plan - 03/31/21 0810     Clinical Impression Statement Pt presents to clinic reports she has had a complete resolution in symptoms with urinary incontinence and at least 90% improvement with fecal incontinence since last visit. Pt complaint with HEP, and no questions, updated HEP during session and went over with pt. Pt session focused on hip and core strenghtening with coordinated pelvic floor and breathing mechanics throughout with good effect. Pt tolerated well with mild-moderate fatigue and need of rest breaks briefly. Pt benefited from cues intermittently for technique but good carry over. Pt would benefit from additional PT to further address deficits and improve QOL    Personal Factors and Comorbidities Time since onset of injury/illness/exacerbation    Examination-Activity Limitations Continence;Hygiene/Grooming;Lift;Toileting;Bend;Locomotion Level;Squat    Examination-Participation Restrictions Community Activity;Interpersonal Relationship    PT Treatment/Interventions ADLs/Self Care Home Management;Functional mobility training;Therapeutic activities;Therapeutic exercise;Neuromuscular re-education;Manual techniques;Patient/family education;Taping;Scar mobilization;Passive range of motion;Energy conservation    PT Home Exercise Plan [id number]    Consulted and Agree with Plan of Care Patient             Patient will benefit from skilled therapeutic intervention in order to improve the following deficits and impairments:  Decreased coordination, Decreased endurance, Difficulty walking, Improper body mechanics, Impaired flexibility, Decreased mobility, Decreased strength, Postural dysfunction  Visit Diagnosis: Muscle weakness (generalized)  Lack of coordination  Abnormal posture     Problem List Patient Active Problem List   Diagnosis Date Noted   Unilateral primary osteoarthritis, right hip 11/23/2020   Status post total replacement of right hip 11/23/2020   Palpitations  05/21/2020   Coronary artery disease involving native coronary artery of native heart without angina pectoris 05/21/2020   Near syncope 05/05/2020   Seasonal affective disorder ([place]) 04/22/2020   Bipolar 1 disorder, depressed ([place]) 03/01/2018   HTN (hypertension) 03/01/2018   Hypothyroid 03/01/2018   Pernicious anemia 03/01/2018   Osteoporosis 03/01/2018   Macular degeneration 03/01/2018   Stacy Gardner, PT, DPT 01/12/239:22 AM   PHYSICAL THERAPY DISCHARGE SUMMARY  Visits from Start of Care: 3  Current functional level related to goals / functional outcomes: Unable to formally reassess as pt request's to be Dc'd from PT due to inability to get transportation.    Remaining deficits: Unable to formally reassess   Education / Equipment: HEP   Patient agrees to discharge. Patient goals were partially met. Patient is being discharged due to  Unable to get transportation. Thank you for the referral.     [facility] @ [address] [place], [place], [address] Phone: [phone]   Fax:  [phone]  Name: HENLEIGH ROBELLO MRN: [id number] Date of Birth: [birth year]

## 2021-03-31 NOTE — Progress Notes (Signed)
[facility] Counselor/Therapist Progress Note  Patient ID: Jackie Jones, MRN: [id number],    Date: 03/31/2021  Time Spent: 48 minutes   Virtual Visit via Telehealth Note :  Telephone Only, Pt not able to do video. Connected with patient by a telemedicine/telehealth application, with their informed consent, and verified patient privacy and that I am speaking with the correct person using two identifiers. I discussed the limitations, risks, security and privacy concerns of performing psychotherapy and the availability of in person appointments. I also discussed with the patient that there may be a patient responsible charge related to this service. The patient expressed understanding and agreed to proceed. I discussed the treatment planning with the patient. The patient was provided an opportunity to ask questions and all were answered. The patient agreed with the plan and demonstrated an understanding of the instructions. The patient was advised to call  our office if  symptoms worsen or feel they are in a crisis state and need immediate contact.   Therapist Location: [facility] Patient Location: home   Treatment Type: Individual Therapy  Reported Symptoms: anxiety, depression ("the seasonal depression has really descended on me.")  Mental Status Exam:  Appearance:   N/a   telehealth      Behavior:  Appropriate and Sharing  Motor:  Can now walk without assistance  Speech/Language:   Clear and Coherent  Affect:  N/a  telehealth  Mood:  anxious, depressed, and irritable  Thought process:  goal directed  Thought content:    Some rumination  Sensory/Perceptual disturbances:    WNL  Orientation:  oriented to person, place, time/date, situation, day of week, month of year, year, and stated date of Jan. 12, 2023  Attention:  Good  Concentration:  Good  Memory:  WNL  Fund of knowledge:   Good  Insight:    Good  Judgment:   Good  Impulse Control:  Good   Risk  Assessment: Danger to Self:  No Self-injurious Behavior: No Danger to Others: No Duty to Warn:no Physical Aggression / Violence:No  Access to Firearms a concern: No  Gang Involvement:No   Subjective: Patient today reporting anxiety and depression as her main symptoms, with depression being the stronger symptom. "I dread having to get up in the morning because I'm more depressed and usually am in the winter months". States that she has a list to remind her of what to do each day to take care of herself. Talks with sister daily on phone and sees each other at least weekly. Feels hopeless and helpless at times but never any thoughts of self-harm. States that reading and walking helps her cope. "If it's sunny I go outside."  [facility] about 5 miles total per day.Son in [place] that had been very ill, is now doing better, so patient not as worried about him at this point. Things that help her is talking through her concerns and anxieties/depressive thoughts,  my light therapy, eating healthy, walking, talking on phone with sister, visit from sister, having adequate sleep, raising her window blinds higher, praying, reading, and talking to other friends in her building.  Encouraged her being proactive in continuing these activities that help and her engagement in our therapy sessions where she is able to be very open and articulate in working on her anxiety and depression.  Continues to deny any SI and lives very purposeful each day.  Interventions: Solution-Oriented/Positive Psychology, Ego-Supportive, and Insight-Oriented   Treatment Goal Plan: Patient not signing treatment  goal plan on computer screen due to [place]. Treatment goals: Treatment goals remain on treatment plan as patient works with strategies to achieve her goals.  Progress will be assessed at each session and documented in the "progress" section of treatment note. Long-term goal: Reduce overall level, frequency, and intensity of the anxiety so that  daily functioning is not as impaired. Short-term goal: Increase understanding of beliefs and messages that produce the worry and anxiety Strategies: Help the client develop reality-based, positive cognitive messages.   Diagnosis:   ICD-10-CM   1. Generalized anxiety disorder  F41.1      Plan:  Patient today reported lower motivation but became more motivated during session as she actively participated in working on her depression and anxiety, with her depression being a stronger symptom currently as she struggles with seasonal depression.  She did very well today in talking through a lot of her anxieties, depressive thoughts and feelings, as well as things that help her during these times.  She denies any SI, and is good about using some of the strategies we have spoken about previously to help her through the darker winter months which is when she typically struggles more.  A list of things that she has found helpful is noted above in the "subject" section of this note.  Despite her depression, she still showed increasing motivation and engagement in session today.  Encouraged her in her practice of more positive behaviors including: Reflecting on her progress more often, for every negative thought create 2 positives, believing in herself more, asking for help when she needs it, having good boundaries and limits with others as needed, saying no when she needs to say no, being safe in her moving about in her apartment, staying in touch with people who are supportive of her, walking as she is able, allowing for good sleep patterns, getting outside some as she is able and weather permitting, healthy nutrition, positive self talk, seeing the positives within herself and being able to list them, developing the habit of looking for more positives versus negatives each day, and realize the strength she shows as she works with goal-directed behaviors to move in a direction that supports improved emotional  health.  Goal review and progress/challenges noted with patient.  Next appt within 1 month  This record has been created using [employer].  Chart creation errors have been sought, but may not always have been located and corrected.  Such creation errors do not reflect on the standard of medical care provided.   Shanon Ace, LCSW

## 2021-04-11 ENCOUNTER — Other Ambulatory Visit: Payer: Self-pay

## 2021-04-11 ENCOUNTER — Other Ambulatory Visit: Payer: Medicare Other

## 2021-04-11 ENCOUNTER — Other Ambulatory Visit: Payer: Self-pay | Admitting: Orthopedic Surgery

## 2021-04-11 DIAGNOSIS — I10 Essential (primary) hypertension: Secondary | ICD-10-CM | POA: Diagnosis not present

## 2021-04-12 DIAGNOSIS — F314 Bipolar disorder, current episode depressed, severe, without psychotic features: Secondary | ICD-10-CM | POA: Diagnosis not present

## 2021-04-14 ENCOUNTER — Encounter: Payer: Self-pay | Admitting: Orthopedic Surgery

## 2021-04-14 ENCOUNTER — Other Ambulatory Visit: Payer: Self-pay

## 2021-04-14 ENCOUNTER — Ambulatory Visit (INDEPENDENT_AMBULATORY_CARE_PROVIDER_SITE_OTHER): Payer: Medicare Other | Admitting: Orthopedic Surgery

## 2021-04-14 VITALS — BP 126/78 | HR 87 | Temp 97.7°F | Ht 62.75 in | Wt 139.4 lb

## 2021-04-14 DIAGNOSIS — Z1159 Encounter for screening for other viral diseases: Secondary | ICD-10-CM

## 2021-04-14 DIAGNOSIS — E038 Other specified hypothyroidism: Secondary | ICD-10-CM | POA: Diagnosis not present

## 2021-04-14 DIAGNOSIS — M810 Age-related osteoporosis without current pathological fracture: Secondary | ICD-10-CM

## 2021-04-14 DIAGNOSIS — F319 Bipolar disorder, unspecified: Secondary | ICD-10-CM | POA: Diagnosis not present

## 2021-04-14 DIAGNOSIS — F419 Anxiety disorder, unspecified: Secondary | ICD-10-CM | POA: Diagnosis not present

## 2021-04-14 DIAGNOSIS — F338 Other recurrent depressive disorders: Secondary | ICD-10-CM | POA: Diagnosis not present

## 2021-04-14 DIAGNOSIS — E063 Autoimmune thyroiditis: Secondary | ICD-10-CM | POA: Diagnosis not present

## 2021-04-14 DIAGNOSIS — D62 Acute posthemorrhagic anemia: Secondary | ICD-10-CM | POA: Diagnosis not present

## 2021-04-14 DIAGNOSIS — Z96641 Presence of right artificial hip joint: Secondary | ICD-10-CM

## 2021-04-14 DIAGNOSIS — I10 Essential (primary) hypertension: Secondary | ICD-10-CM | POA: Diagnosis not present

## 2021-04-14 DIAGNOSIS — Z79899 Other long term (current) drug therapy: Secondary | ICD-10-CM | POA: Diagnosis not present

## 2021-04-14 LAB — CBC WITH DIFFERENTIAL/PLATELET
Absolute Monocytes: 519 cells/uL (ref 200–950)
Basophils Absolute: 59 cells/uL (ref 0–200)
Basophils Relative: 1 %
Eosinophils Absolute: 301 cells/uL (ref 15–500)
Eosinophils Relative: 5.1 %
HCT: 36.5 % (ref 35.0–45.0)
Hemoglobin: 11.5 g/dL — ABNORMAL LOW (ref 11.7–15.5)
Lymphs Abs: 1864 cells/uL (ref 850–3900)
MCH: 24.7 pg — ABNORMAL LOW (ref 27.0–33.0)
MCHC: 31.5 g/dL — ABNORMAL LOW (ref 32.0–36.0)
MCV: 78.3 fL — ABNORMAL LOW (ref 80.0–100.0)
MPV: 10.5 fL (ref 7.5–12.5)
Monocytes Relative: 8.8 %
Neutro Abs: 3157 cells/uL (ref 1500–7800)
Neutrophils Relative %: 53.5 %
Platelets: 275 10*3/uL (ref 140–400)
RBC: 4.66 10*6/uL (ref 3.80–5.10)
RDW: 19.2 % — ABNORMAL HIGH (ref 11.0–15.0)
Total Lymphocyte: 31.6 %
WBC: 5.9 10*3/uL (ref 3.8–10.8)

## 2021-04-14 LAB — TEST AUTHORIZATION

## 2021-04-14 LAB — COMPREHENSIVE METABOLIC PANEL
AG Ratio: 1.3 (calc) (ref 1.0–2.5)
ALT: 15 U/L (ref 6–29)
AST: 19 U/L (ref 10–35)
Albumin: 3.9 g/dL (ref 3.6–5.1)
Alkaline phosphatase (APISO): 75 U/L (ref 37–153)
BUN: 24 mg/dL (ref 7–25)
CO2: 30 mmol/L (ref 20–32)
Calcium: 9.5 mg/dL (ref 8.6–10.4)
Chloride: 99 mmol/L (ref 98–110)
Creat: 0.64 mg/dL (ref 0.60–1.00)
Globulin: 3 g/dL (calc) (ref 1.9–3.7)
Glucose, Bld: 98 mg/dL (ref 65–99)
Potassium: 4.6 mmol/L (ref 3.5–5.3)
Sodium: 135 mmol/L (ref 135–146)
Total Bilirubin: 0.4 mg/dL (ref 0.2–1.2)
Total Protein: 6.9 g/dL (ref 6.1–8.1)

## 2021-04-14 LAB — HEPATITIS C ANTIBODY
Hepatitis C Ab: NONREACTIVE
SIGNAL TO CUT-OFF: 0.03 (ref <1.00)

## 2021-04-14 MED ORDER — ALENDRONATE SODIUM 70 MG PO TABS: ORAL_TABLET | ORAL | 3 refills | Status: DC

## 2021-04-14 NOTE — Patient Instructions (Addendum)
2-3 times a week eat food rich in iron  Please ask local pharmacy about Shingles vaccine

## 2021-04-14 NOTE — Progress Notes (Signed)
Careteam: Patient Care Team: Yvonna Alanis, NP as PCP - General (Adult Health Nurse Practitioner)  Seen by: Windell Moulding, AGNP-C  PLACE OF SERVICE:  [facility]  Advanced Directive information    Allergies  Allergen Reactions   Contrast Media [Iodinated Contrast Media] Itching   Trintellix [Vortioxetine] Other (See Comments)    Led to mania   Dairycare [Lactase-Lactobacillus]    Doxycycline Hyclate Nausea And Vomiting    Double vision   Soy Allergy Itching   Ciprofloxacin Rash   Levaquin [Levofloxacin] Rash   Penicillins Rash    Chief Complaint  Patient presents with   Medical Management of Chronic Issues    Patient returns to the office for 3 month follow up.   Quality Metric Gaps    Dexa scheduled for March Shigles due Hep C     HPI: Patient is a 76 y.o. female seen today for medical management of chronic conditions.   Lab results discussed with patient.   Hip replacement doing well. Ambulating with cane for psychological support. Helps her have a sense of safety when out. PT completed this week. She continues to do her exercises daily on her own. Denies pain. She remains on Celebrex. No recent falls or injuries.   Not seeing therapist Rinaldo Cloud anymore. She saw her earlier this month and was unable to talk, kept asking screening questions. Admits to still feeling blue. Believes it is related to seasonal affective disorder. Using UV light daily. Close relationship with sister helpful. Continues to see psychiatrist- she is on Effexor and Depakote.   Bone density scheduled 05/25/2021. Continues to take fosamax weekly.   Reports some anxiety. No recent panic attacks.   Discussed getting shingles vaccine today.      Review of Systems:  Review of Systems  Constitutional:  Negative for chills, fever, malaise/fatigue and weight loss.  Respiratory:  Negative for cough, shortness of breath and wheezing.   Cardiovascular:  Negative for chest pain and leg swelling.   Gastrointestinal:  Negative for abdominal pain, blood in stool, constipation, diarrhea, heartburn, nausea and vomiting.  Genitourinary:  Negative for dysuria, frequency and hematuria.  Musculoskeletal:  Positive for joint pain. Negative for falls.  Neurological:  Negative for dizziness, weakness and headaches.  Psychiatric/Behavioral:  Positive for depression. Negative for suicidal ideas. The patient is nervous/anxious. The patient does not have insomnia.    Past Medical History:  Diagnosis Date   Allergy    Anxiety 2000   Arthritis    Bipolar 1 disorder, depressed ([place])    Bipolar disorder ([place])    Chicken pox    Dementia ([place])    Depression    Dysrhythmia 05/10/2020   pt states "cardiologist told me it is not dangerous, it's an anomaly"   Hashimoto's thyroiditis    History of blood transfusion    History of bone density study 2019   History of colonoscopy 2015   History of CT scan 2019   History of mammogram 2019   History of MRI    History of Papanicolaou smear of cervix    History of right hip replacement    Hx: UTI (urinary tract infection)    Hypertension    Hypothyroidism    Lactose intolerance    Legally blind in left eye, as defined in [place]    Non-celiac gluten sensitivity    Osteoporosis    Pernicious anemia    Pernicious anemia    Rheumatic fever    Thyroid disease  Urinary and fecal incontinence    Past Surgical History:  Procedure Laterality Date   [place]   EYE SURGERY Bilateral 2014   cataracts   LEFT HEART CATH AND CORONARY ANGIOGRAPHY N/A 05/10/2020   Procedure: LEFT HEART CATH AND CORONARY ANGIOGRAPHY;  Surgeon: Nelva Bush, MD;  Location: [facility];  Service: Cardiovascular;  Laterality: N/A;   prolapsed rectum 2015     TONSILLECTOMY AND ADENOIDECTOMY  1966   TOTAL HIP ARTHROPLASTY Right 11/23/2020   Procedure: RIGHT TOTAL HIP ARTHROPLASTY ANTERIOR APPROACH;  Surgeon: Mcarthur Rossetti, MD;  Location: [facility];  Service: Orthopedics;  Laterality: Right;   TUBAL LIGATION     Social History:   reports that she has never smoked. She has never used smokeless tobacco. She reports that she does not drink alcohol and does not use drugs.  Family History  Problem Relation Age of Onset   Heart disease Mother    Arthritis Mother    Cancer Father        bladder   Thyroid disease Father    Dementia Father    Hypertension Sister    Hypertension Brother    Hypertension Sister    Hypertension Sister    Diabetes Brother    Diabetes type II Son     Medications: Patient's Medications  New Prescriptions   No medications on file  Previous Medications   ACETAMINOPHEN (TYLENOL) 500 MG TABLET    Take 1,000 mg by mouth in the morning and at bedtime.   ALENDRONATE (FOSAMAX) 70 MG TABLET    TAKE 1 TABLET(70 MG) BY MOUTH 1 TIME A WEEK WITH A FULL GLASS OF WATER AND ON AN EMPTY STOMACH   AMLODIPINE (NORVASC) 10 MG TABLET    TAKE 1 TABLET(10 MG) BY MOUTH DAILY   ASPIRIN 81 MG CHEWABLE TABLET    Chew 1 tablet (81 mg total) by mouth 2 (two) times daily.   CALCIUM-VITAMIN D-VITAMIN K (VIACTIV CALCIUM PLUS D) 650-12.5-40 MG-MCG-MCG CHEW    Chew 2 tablets by mouth daily.   CELECOXIB (CELEBREX) 50 MG CAPSULE    Take 1 capsule (50 mg total) by mouth 2 (two) times daily.   CYANOCOBALAMIN (,VITAMIN B-12,) 1000 MCG/ML INJECTION    INJECT 1 ML INTO THE MUSCLE EVERY 30 DAYS   DIVALPROEX (DEPAKOTE ER) 500 MG 24 HR TABLET    Take 1 tablet (500 mg total) by mouth at bedtime.   EMOLLIENT (AVEENO RESTORATIVE SKIN THERAP) CREA    Apply 1 application topically daily.   HALOBETASOL (ULTRAVATE) 0.05 % OINTMENT    Apply 1 application topically daily as needed (eczema).   LEVOTHYROXINE (SYNTHROID) 112 MCG TABLET    Take 1 tablet (112 mcg total) by mouth daily.   LORATADINE (CLARITIN) 10 MG TABLET    Take 10 mg by mouth daily.   MULTIPLE VITAMINS-MINERALS (CENTRUM SILVER ADULT 50+ PO)    Take 1 tablet  by mouth daily.   MULTIPLE VITAMINS-MINERALS (PRESERVISION AREDS) CAPS    Take 1 capsule by mouth in the morning and at bedtime.   OLMESARTAN (BENICAR) 20 MG TABLET    TAKE 1 TABLET(20 MG) BY MOUTH DAILY   POLYETHYL GLYCOL-PROPYL GLYCOL 0.4-0.3 % SOLN    Place 1 drop into both eyes in the morning and at bedtime.   SYRINGE-NEEDLE, DISP, 3 ML (BD SAFETYGLIDE SYRINGE/NEEDLE) 25G X 1" 3 ML MISC    Inject 12ml in deltoid once monthly  VENLAFAXINE XR (EFFEXOR-XR) 150 MG 24 HR CAPSULE    Take 150 mg by mouth daily with breakfast.   WHEAT DEXTRIN (BENEFIBER PO)    Take by mouth. Take one teaspoon daily  Modified Medications   No medications on file  Discontinued Medications   No medications on file    Physical Exam:  Vitals:   04/14/21 1016  BP: 126/78  Pulse: 87  Temp: 97.7 F (36.5 C)  SpO2: 99%  Weight: 139 lb 6.4 oz (63.2 kg)  Height: 5' 2.75" (1.594 m)   Body mass index is 24.89 kg/m. Wt Readings from Last 3 Encounters:  04/14/21 139 lb 6.4 oz (63.2 kg)  02/21/21 139 lb 4 oz (63.2 kg)  01/06/21 142 lb (64.4 kg)    Physical Exam Vitals reviewed.  Constitutional:      General: She is not in acute distress. HENT:     Head: Normocephalic.  Eyes:     General:        Right eye: No discharge.        Left eye: No discharge.  Neck:     Thyroid: No thyroid mass or thyromegaly.     Vascular: No carotid bruit.  Cardiovascular:     Rate and Rhythm: Normal rate and regular rhythm.     Pulses: Normal pulses.     Heart sounds: Normal heart sounds.  Pulmonary:     Effort: Pulmonary effort is normal. No respiratory distress.     Breath sounds: Normal breath sounds. No wheezing.  Abdominal:     General: Bowel sounds are normal. There is no distension.     Palpations: Abdomen is soft.     Tenderness: There is no abdominal tenderness.  Musculoskeletal:     Cervical back: Normal range of motion.     Right lower leg: No edema.     Left lower leg: No edema.  Lymphadenopathy:      Cervical: No cervical adenopathy.  Skin:    General: Skin is warm and dry.     Capillary Refill: Capillary refill takes less than 2 seconds.  Neurological:     General: No focal deficit present.     Mental Status: She is alert and oriented to person, place, and time.  Psychiatric:        Mood and Affect: Mood normal.        Behavior: Behavior normal.    Labs reviewed: Basic Metabolic Panel: Recent Labs    08/31/20 1106 11/15/20 1013 12/08/20 1538 01/03/21 0903 04/11/21 0846  NA 133*   < > 137 135 135  K 4.9   < > 4.6 4.8 4.6  CL 101   < > 103 98 99  CO2 21   < > 24 28 30   GLUCOSE 124   < > 98 89 98  BUN 31*   < > 32* 25 24  CREATININE 1.06*   < > 0.81 0.68 0.64  CALCIUM 9.4   < > 9.1 9.7 9.5  TSH 0.08*  --   --  3.72  --    < > = values in this interval not displayed.   Liver Function Tests: Recent Labs    08/31/20 1106 04/11/21 0846  AST 20 19  ALT 14 15  BILITOT 0.3 0.4  PROT 6.3 6.9   No results for input(s): LIPASE, AMYLASE in the last 8760 hours. No results for input(s): AMMONIA in the last 8760 hours. CBC: Recent Labs    12/08/20 1538 01/03/21 5638  04/11/21 0846  WBC 8.2 8.1 5.9  NEUTROABS 4,715 4,690 3,157  HGB 8.2* 9.6* 11.5*  HCT 25.4* 30.4* 36.5  MCV 91.4 84.7 78.3*  PLT 622* 449* 275   Lipid Panel: No results for input(s): CHOL, HDL, LDLCALC, TRIG, CHOLHDL, LDLDIRECT in the last 8760 hours. TSH: Recent Labs    08/31/20 1106 01/03/21 0903  TSH 0.08* 3.72   A1C: No results found for: HGBA1C   Assessment/Plan 1. Age-related osteoporosis without current pathological fracture - bone density 2019, t score unknown - bone density scheduled 05/2021 - cont weight bearing exercises - cont calcium and vitamin d supplement - alendronate (FOSAMAX) 70 MG tablet; TAKE 1 TABLET(70 MG) BY MOUTH 1 TIME A WEEK WITH A FULL GLASS OF WATER AND ON AN EMPTY STOMACH  Dispense: 12 tablet; Refill: 3  2. Status post total replacement of right hip -  followed by Dr. Ninfa Linden - WBAT, no pain - cont tylenol prn for pain - cont PT exercises to prevent weakness  3. Bipolar 1 disorder, depressed ([place]) - no recent mania/depressive episodes - followed by psychiatry - cont Depakote and Effexor  4. Seasonal affective disorder ([place]) - no mood changes - cont UV lamp - advised to find another counselor if mood changes  5. Postoperative anemia due to acute blood loss - hgb rebounded to 11.5 04/11/2021 - ? Iron deficiency anemia- she does not want workup at this time - cont eating iron rich foods  6. Essential hypertension - controlled - cont norvasc and Benicar  7. Anxiety - no recent panic attacks  8. Hypothyroidism due to Hashimoto's thyroiditis - TSH 3.72 01/03/2021 - cont levothyroxine  9. Need for hepatitis C screening test - hep c antigen added to labs drawn 01/23- per [facility]  Total time: 37 minutes. Greater than 50% of total time spent doing patient education regarding health maintenance, depression, anxiety, and lab work.    Next appt: Visit date not found  [place], [facility] [phone]

## 2021-04-22 ENCOUNTER — Encounter: Payer: Self-pay | Admitting: Physical Therapy

## 2021-04-29 ENCOUNTER — Encounter: Payer: Self-pay | Admitting: Physical Therapy

## 2021-05-05 DIAGNOSIS — H353132 Nonexudative age-related macular degeneration, bilateral, intermediate dry stage: Secondary | ICD-10-CM | POA: Diagnosis not present

## 2021-05-05 DIAGNOSIS — Z961 Presence of intraocular lens: Secondary | ICD-10-CM | POA: Diagnosis not present

## 2021-05-05 DIAGNOSIS — H16223 Keratoconjunctivitis sicca, not specified as Sjogren's, bilateral: Secondary | ICD-10-CM | POA: Diagnosis not present

## 2021-05-05 DIAGNOSIS — H35372 Puckering of macula, left eye: Secondary | ICD-10-CM | POA: Diagnosis not present

## 2021-05-06 ENCOUNTER — Encounter: Payer: Self-pay | Admitting: Physical Therapy

## 2021-05-13 ENCOUNTER — Encounter: Payer: Self-pay | Admitting: Physical Therapy

## 2021-05-17 ENCOUNTER — Other Ambulatory Visit: Payer: Self-pay | Admitting: *Deleted

## 2021-05-17 MED ORDER — VENLAFAXINE HCL ER 150 MG PO CP24: 150.0000 mg | ORAL_CAPSULE | Freq: Every day | ORAL | 1 refills | Status: DC

## 2021-05-17 NOTE — Telephone Encounter (Signed)
[facility] requested refill.  Pended Rx and sent to Amy for approval.

## 2021-05-20 ENCOUNTER — Encounter: Payer: Self-pay | Admitting: Physical Therapy

## 2021-05-25 ENCOUNTER — Ambulatory Visit
Admission: RE | Admit: 2021-05-25 | Discharge: 2021-05-25 | Disposition: A | Discharge status: Home / Self Care | Payer: Medicare Other | Source: Ambulatory Visit | Attending: Family | Admitting: Family

## 2021-05-25 DIAGNOSIS — M810 Age-related osteoporosis without current pathological fracture: Secondary | ICD-10-CM

## 2021-05-25 DIAGNOSIS — Z780 Asymptomatic menopausal state: Secondary | ICD-10-CM | POA: Diagnosis not present

## 2021-05-27 ENCOUNTER — Encounter: Payer: Self-pay | Admitting: Physical Therapy

## 2021-05-27 DIAGNOSIS — F3131 Bipolar disorder, current episode depressed, mild: Secondary | ICD-10-CM | POA: Diagnosis not present

## 2021-05-31 ENCOUNTER — Other Ambulatory Visit: Payer: Self-pay | Admitting: *Deleted

## 2021-05-31 DIAGNOSIS — I10 Essential (primary) hypertension: Secondary | ICD-10-CM

## 2021-05-31 MED ORDER — OLMESARTAN MEDOXOMIL 20 MG PO TABS: ORAL_TABLET | ORAL | 1 refills | Status: DC

## 2021-05-31 NOTE — Telephone Encounter (Signed)
Patient requested refill

## 2021-06-03 ENCOUNTER — Encounter: Payer: Self-pay | Admitting: Physical Therapy

## 2021-06-06 ENCOUNTER — Encounter: Payer: Self-pay | Admitting: Orthopaedic Surgery

## 2021-06-06 DIAGNOSIS — B351 Tinea unguium: Secondary | ICD-10-CM | POA: Diagnosis not present

## 2021-06-06 DIAGNOSIS — M792 Neuralgia and neuritis, unspecified: Secondary | ICD-10-CM | POA: Diagnosis not present

## 2021-06-06 DIAGNOSIS — I739 Peripheral vascular disease, unspecified: Secondary | ICD-10-CM | POA: Diagnosis not present

## 2021-06-10 DIAGNOSIS — F3175 Bipolar disorder, in partial remission, most recent episode depressed: Secondary | ICD-10-CM | POA: Diagnosis not present

## 2021-06-13 ENCOUNTER — Ambulatory Visit: Payer: Medicare Other | Admitting: Physician Assistant

## 2021-06-27 ENCOUNTER — Other Ambulatory Visit: Payer: Self-pay | Admitting: Orthopedic Surgery

## 2021-06-27 DIAGNOSIS — I10 Essential (primary) hypertension: Secondary | ICD-10-CM

## 2021-06-29 ENCOUNTER — Telehealth: Payer: Self-pay

## 2021-06-29 ENCOUNTER — Encounter: Payer: Self-pay | Admitting: Orthopedic Surgery

## 2021-06-29 NOTE — Telephone Encounter (Addendum)
Ms. lumen, brinlee are scheduled for a virtual visit with your provider today.   ? ?Just as we do with appointments in the office, we must obtain your consent to participate.  Your consent will be active for this visit and any virtual visit you may have with one of our providers in the next 365 days.   ? ?If you have a MyChart account, I can also send a copy of this consent to you electronically.  All virtual visits are billed to your insurance company just like a traditional visit in the office.  As this is a virtual visit, video technology does not allow for your provider to perform a traditional examination.  This may limit your provider's ability to fully assess your condition.  If your provider identifies any concerns that need to be evaluated in person or the need to arrange testing such as labs, EKG, etc, we will make arrangements to do so.   ? ?Although advances in technology are sophisticated, we cannot ensure that it will always work on either your end or our end.  If the connection with a video visit is poor, we may have to switch to a telephone visit.  With either a video or telephone visit, we are not always able to ensure that we have a secure connection.   I need to obtain your verbal consent now.   Are you willing to proceed with your visit today?  ? ?SHELDA TRUBY has provided verbal consent on 06/29/2021 for a virtual visit (video or telephone). ? ? ?Scheryl Marten, CMA ?06/29/2021  1:00 pm ?

## 2021-06-29 NOTE — Progress Notes (Signed)
This service is provided via telemedicine ? ?No vital signs collected/recorded due to the encounter was a telemedicine visit.  ? ?Location of patient (ex: home, work):  Home ? ?Patient consents to a telephone visit:  Yes see telephone/video visit consent dated 06/29/21 ? ?Location of the provider (ex: office, home):  [facility]  ? ?Name of any referring provider:  n/a ? ?Names of all persons participating in the telemedicine service and their role in the encounter:  Bing Matter, CMA, Amy E.Fargo, NP and patient.  ? ?Time spent on call:  9 min  ? ?

## 2021-06-30 ENCOUNTER — Ambulatory Visit (INDEPENDENT_AMBULATORY_CARE_PROVIDER_SITE_OTHER): Payer: Medicare Other | Admitting: Orthopedic Surgery

## 2021-06-30 DIAGNOSIS — Z0000 Encounter for general adult medical examination without abnormal findings: Secondary | ICD-10-CM | POA: Diagnosis not present

## 2021-06-30 NOTE — Progress Notes (Signed)
? ?Subjective:  ? Jackie Jones is a 76 y.o. female who presents for Medicare Annual (Subsequent) preventive examination. ? ?Telephone Note  ? ?I connected with Delorse Limber by telephone and verified that I am speaking with the correct person using two identifiers.  ? ? ?Patient location: home ?Provider: Yvonna Alanis, NP  ?Provider location: [facility] ?   ?I discussed the limitations, risks, security and privacy concerns of performing an evaluation and management service by telephone and the availability of in person appointments. I also discussed with the patient that there may be a patient responsible charge related to this service. The patient expressed understanding and agreed to proceed.  ? ? ?I discussed the assessment and treatment plan with the patient. The patient was provided an opportunity to ask questions and all were answered. The patient agreed with the plan and demonstrated an understanding of the instructions.  ?   ?The patient was advised to call back or seek an in-person evaluation if the symptoms worsen or if the condition fails to improve as anticipated.  ? ?I provided 12 minutes of non-face-to-face time during this encounter.  ? ?Yvonna Alanis, NP ? Avs printed and mailed  ? ?Review of Systems    ? ?Cardiac Risk Factors include: advanced age (>65mn, >>41women);hypertension ? ?   ?Objective:  ?  ?There were no vitals filed for this visit. ?There is no height or weight on file to calculate BMI. ? ? ?  06/29/2021  ?  2:55 PM 02/28/2021  ? 12:00 PM 01/06/2021  ? 10:24 AM 12/08/2020  ?  3:03 PM 11/15/2020  ?  9:59 AM 06/24/2020  ?  1:59 PM 05/28/2020  ? 11:36 AM  ?Advanced Directives  ?Does Patient Have a Medical Advance Directive? Yes Yes Yes Yes Yes Yes Yes  ?Type of AMaterials engineerof [place] will;Out of facility DNR (pink MOST or yellow form) [place] Living will;Healthcare  Power of Attorney   ?Does patient want to make changes to medical advance directive? No - Patient declined  No - Patient declined  Yes (MAU/Ambulatory/Procedural Areas - Information given) No - Patient declined No - Patient declined  ?Copy of [place]? Yes - validated most recent copy scanned in chart (See row information)  Yes - validated most recent copy scanned in chart (See row information) Yes - validated most recent copy scanned in chart (See row information) Yes - validated most recent copy scanned in chart (See row information) No - copy requested   ?Pre-existing out of facility DNR order (yellow form or pink MOST form)    Yellow form placed in chart (order not valid for inpatient use)     ? ? ?Current Medications (verified) ?Outpatient Encounter Medications as of 06/30/2021  ?Medication Sig  ? acetaminophen (TYLENOL) 500 MG tablet Take 1,000 mg by mouth in the morning and at bedtime.  ? alendronate (FOSAMAX) 70 MG tablet TAKE 1 TABLET(70 MG) BY MOUTH 1 TIME A WEEK WITH A FULL GLASS OF WATER AND ON AN EMPTY STOMACH  ? amLODipine (NORVASC) 10 MG tablet Take 1 tablet by mouth once daily  ? aspirin 81 MG chewable tablet Chew 1 tablet (81 mg total) by mouth 2 (two) times daily.  ? Calcium-Vitamin D-Vitamin K (VIACTIV CALCIUM PLUS D) 650-12.5-40 MG-MCG-MCG CHEW Chew 2 tablets by mouth daily.  ? celecoxib (CELEBREX) 50 MG capsule Take  1 capsule (50 mg total) by mouth 2 (two) times daily.  ? cyanocobalamin (,VITAMIN B-12,) 1000 MCG/ML injection INJECT 1 ML INTO THE MUSCLE EVERY 30 DAYS  ? divalproex (DEPAKOTE ER) 500 MG 24 hr tablet Take 1 tablet (500 mg total) by mouth at bedtime.  ? Emollient (AVEENO RESTORATIVE SKIN THERAP) CREA Apply 1 application topically daily.  ? halobetasol (ULTRAVATE) 0.05 % ointment Apply 1 application topically daily as needed (eczema).  ? levothyroxine (SYNTHROID) 112 MCG tablet Take 1 tablet (112 mcg total) by mouth daily.  ? loratadine (CLARITIN) 10 MG  tablet Take 10 mg by mouth daily.  ? Multiple Vitamins-Minerals (CENTRUM SILVER ADULT 50+ PO) Take 1 tablet by mouth daily.  ? Multiple Vitamins-Minerals (PRESERVISION AREDS) CAPS Take 1 capsule by mouth in the morning and at bedtime.  ? olmesartan (BENICAR) 20 MG tablet Take one tablet by mouth once daily.  ? Polyethyl Glycol-Propyl Glycol 0.4-0.3 % SOLN Place 1 drop into both eyes in the morning and at bedtime.  ? SYRINGE-NEEDLE, DISP, 3 ML (BD SAFETYGLIDE SYRINGE/NEEDLE) 25G X 1" 3 ML MISC Inject 60m in deltoid once monthly  ? venlafaxine XR (EFFEXOR-XR) 150 MG 24 hr capsule Take 1 capsule (150 mg total) by mouth daily with breakfast.  ? Wheat Dextrin (BENEFIBER PO) Take by mouth. Take one teaspoon daily  ? ?No facility-administered encounter medications on file as of 06/30/2021.  ? ? ?Allergies (verified) ?Contrast media [iodinated contrast media], Trintellix [vortioxetine], Dairycare [lactase-lactobacillus], Doxycycline hyclate, Soy allergy, Ciprofloxacin, Levaquin [levofloxacin], and Penicillins  ? ?History: ?Past Medical History:  ?Diagnosis Date  ? Allergy   ? Anxiety 2000  ? Arthritis   ? Bipolar 1 disorder, depressed ([place]   ? Bipolar disorder ([place]   ? Chicken pox   ? Dementia ([place]   ? Depression   ? Dysrhythmia 05/10/2020  ? pt states "cardiologist told me it is not dangerous, it's an anomaly"  ? Hashimoto's thyroiditis   ? History of blood transfusion   ? History of bone density study 2019  ? History of colonoscopy 2015  ? History of CT scan 2019  ? History of mammogram 2019  ? History of MRI   ? History of Papanicolaou smear of cervix   ? History of right hip replacement   ? Hx: UTI (urinary tract infection)   ? Hypertension   ? Hypothyroidism   ? Lactose intolerance   ? Legally blind in left [place], [place]  ? Non-celiac gluten sensitivity   ? Osteoporosis   ? Pernicious anemia   ? Pernicious anemia   ? Rheumatic fever   ? Thyroid disease   ? Urinary and fecal incontinence   ? ?Past Surgical  History:  ?Procedure Laterality Date  ? ABDOMINAL HYSTERECTOMY    ? 1981  ? APPENDECTOMY  1981  ? EYE SURGERY Bilateral 2014  ? cataracts  ? LEFT HEART CATH AND CORONARY ANGIOGRAPHY N/A 05/10/2020  ? Procedure: LEFT HEART CATH AND CORONARY ANGIOGRAPHY;  Surgeon: ENelva Bush MD;  [facility];  Service: Cardiovascular;  Laterality: N/A;  ? prolapsed rectum 2015    ? TONSILLECTOMY AND ADENOIDECTOMY  1966  ? TOTAL HIP ARTHROPLASTY Right 11/23/2020  ? Procedure: RIGHT TOTAL HIP ARTHROPLASTY ANTERIOR APPROACH;  Surgeon: BMcarthur Rossetti MD;  [place]  Service: Orthopedics;  Laterality: Right;  ? TUBAL LIGATION    ? ?Family History  ?Problem Relation Age of Onset  ? Heart disease Mother   ?  Arthritis Mother   ? Cancer Father   ?     bladder  ? Thyroid disease Father   ? Dementia Father   ? Hypertension Sister   ? Hypertension Brother   ? Hypertension Sister   ? Hypertension Sister   ? Diabetes Brother   ? Diabetes type II Son   ? ?Social History  ? ?Socioeconomic History  ? Marital status: Single  ?  Spouse name: Not on file  ? Number of children: 1  ? Years of education: Not on file  ? Highest education level: Not on file  ?Occupational History  ? Not on file  ?Tobacco Use  ? Smoking status: Never  ? Smokeless tobacco: Never  ?Vaping Use  ? Vaping Use: Never used  ?Substance and Sexual Activity  ? Alcohol use: Never  ? Drug use: Never  ? Sexual activity: Not Currently  ?Other Topics Concern  ? Not on file  ?Social History Narrative  ? Tobacco use, amount per day now: None.  ? Past tobacco use, amount per day: None.  ? How many years did you use tobacco: None.  ? Alcohol use (drinks per week): None.  ? Diet: I eat a low fat diet. Much in vegetables.   ? Do you drink/eat things with caffeine: Yes.  ? Marital status:  Divorced                                What year were you married? 1976  ? Do you live in a house, apartment, assisted living, condo, trailer, etc.? Rent small house.  ? Is  it one or more stories? One.  ? How many persons live in your home? I live alone.   ? Do you have pets in your home?( please list) No.  ? Highest Level of education completed? Masters Degree Plus.  ? Cu

## 2021-06-30 NOTE — Patient Instructions (Signed)
?  Jackie Jones , ?Thank you for taking time to come for your Medicare Wellness Visit. I appreciate your ongoing commitment to your health goals. Please review the following plan we discussed and let me know if I can assist you in the future.  ? ?These are the goals we discussed: ? Goals   ? ?  Patient Stated   ?  I will continue to walk 5 miles per day  ?  ? ?  ?  ?This is a list of the screening recommended for you and due dates:  ?Health Maintenance  ?Topic Date Due  ? Zoster (Shingles) Vaccine (1 of 2) Never done  ? Flu Shot  10/18/2021  ? Tetanus Vaccine  11/10/2028  ? Pneumonia Vaccine  Completed  ? DEXA scan (bone density measurement)  Completed  ? COVID-19 Vaccine  Completed  ? Hepatitis C Screening: USPSTF Recommendation to screen - Ages 70-79 yo.  Completed  ? HPV Vaccine  Aged Out  ? ?Consider getting Shingrix vaccine at local pharmacy.  ?

## 2021-07-05 ENCOUNTER — Ambulatory Visit: Payer: Medicare Other | Admitting: Orthopaedic Surgery

## 2021-07-07 ENCOUNTER — Encounter: Payer: Self-pay | Admitting: Orthopaedic Surgery

## 2021-07-07 ENCOUNTER — Ambulatory Visit (INDEPENDENT_AMBULATORY_CARE_PROVIDER_SITE_OTHER): Payer: Medicare Other

## 2021-07-07 ENCOUNTER — Ambulatory Visit (INDEPENDENT_AMBULATORY_CARE_PROVIDER_SITE_OTHER): Payer: Medicare Other | Admitting: Orthopaedic Surgery

## 2021-07-07 DIAGNOSIS — Z96641 Presence of right artificial hip joint: Secondary | ICD-10-CM | POA: Diagnosis not present

## 2021-07-07 NOTE — Progress Notes (Signed)
The patient is a 76 year old female who is over 6 months out from a right total hip arthroplasty.  She says she has good range of motion and strength and is doing well overall.  She stated that she feels like she has her life back.  Her hip was so worn out before.  She has a normal left hip and still denies any pain with her left hip.  She is very pleased overall and is not walking with assistive device. ? ?Both hips move smoothly and fluidly.  There is no pain with either hip.  Her leg lengths appear near equal. ? ?An AP pelvis lateral the right hip shows a well-seated total hip arthroplasty with no complicating features. ? ?At this point she can follow-up as needed for her right hip.  All questions and concerns were answered and addressed.  If she does develop any issues she knows to let us know. ?

## 2021-08-01 ENCOUNTER — Other Ambulatory Visit: Payer: Self-pay | Admitting: Orthopedic Surgery

## 2021-08-01 DIAGNOSIS — D510 Vitamin B12 deficiency anemia due to intrinsic factor deficiency: Secondary | ICD-10-CM

## 2021-08-03 ENCOUNTER — Other Ambulatory Visit (HOSPITAL_BASED_OUTPATIENT_CLINIC_OR_DEPARTMENT_OTHER): Payer: Self-pay

## 2021-08-08 ENCOUNTER — Ambulatory Visit: Payer: Medicare Other | Attending: Internal Medicine

## 2021-08-08 ENCOUNTER — Other Ambulatory Visit (HOSPITAL_BASED_OUTPATIENT_CLINIC_OR_DEPARTMENT_OTHER): Payer: Self-pay

## 2021-08-08 DIAGNOSIS — Z23 Encounter for immunization: Secondary | ICD-10-CM

## 2021-08-08 MED ORDER — PFIZER COVID-19 VAC BIVALENT 30 MCG/0.3ML IM SUSP
INTRAMUSCULAR | 0 refills | Status: DC
Start: 2021-08-08 — End: 2021-10-20
  Filled 2021-08-08: qty 0.3, 1d supply, fill #0

## 2021-08-08 NOTE — Progress Notes (Signed)
   Covid-19 Vaccination Clinic  Name:  Jackie Jones    MRN: [id number] DOB: 03-16-1946  08/08/2021  Ms. Hodapp was observed post Covid-19 immunization for 15 minutes without incident. She was provided with Vaccine Information Sheet and instruction to access the V-Safe system.   Ms. Mcgath was instructed to call 911 with any severe reactions post vaccine: Difficulty breathing  Swelling of face and throat  A fast heartbeat  A bad rash all over body  Dizziness and weakness   Immunizations Administered     Name Date Dose VIS Date Route   Pfizer Covid-19 Vaccine Bivalent Booster 08/08/2021 10:00 AM 0.3 mL 11/17/2020 Intramuscular   Manufacturer: [place]   Lot: [id number]   [place]: [phone]

## 2021-08-18 ENCOUNTER — Other Ambulatory Visit: Payer: Self-pay | Admitting: Orthopedic Surgery

## 2021-08-18 DIAGNOSIS — I10 Essential (primary) hypertension: Secondary | ICD-10-CM

## 2021-08-24 ENCOUNTER — Other Ambulatory Visit: Payer: Self-pay | Admitting: Orthopedic Surgery

## 2021-08-24 DIAGNOSIS — E038 Other specified hypothyroidism: Secondary | ICD-10-CM

## 2021-08-31 DIAGNOSIS — F3175 Bipolar disorder, in partial remission, most recent episode depressed: Secondary | ICD-10-CM | POA: Diagnosis not present

## 2021-09-08 DIAGNOSIS — I739 Peripheral vascular disease, unspecified: Secondary | ICD-10-CM | POA: Diagnosis not present

## 2021-09-08 DIAGNOSIS — M792 Neuralgia and neuritis, unspecified: Secondary | ICD-10-CM | POA: Diagnosis not present

## 2021-09-08 DIAGNOSIS — B351 Tinea unguium: Secondary | ICD-10-CM | POA: Diagnosis not present

## 2021-09-22 DIAGNOSIS — L821 Other seborrheic keratosis: Secondary | ICD-10-CM | POA: Diagnosis not present

## 2021-09-22 DIAGNOSIS — L300 Nummular dermatitis: Secondary | ICD-10-CM | POA: Diagnosis not present

## 2021-09-22 DIAGNOSIS — L720 Epidermal cyst: Secondary | ICD-10-CM | POA: Diagnosis not present

## 2021-09-28 DIAGNOSIS — F3175 Bipolar disorder, in partial remission, most recent episode depressed: Secondary | ICD-10-CM | POA: Diagnosis not present

## 2021-10-03 ENCOUNTER — Other Ambulatory Visit (HOSPITAL_BASED_OUTPATIENT_CLINIC_OR_DEPARTMENT_OTHER): Payer: Self-pay

## 2021-10-05 ENCOUNTER — Other Ambulatory Visit (HOSPITAL_BASED_OUTPATIENT_CLINIC_OR_DEPARTMENT_OTHER): Payer: Self-pay

## 2021-10-05 MED ORDER — ZOSTER VAC RECOMB ADJUVANTED 50 MCG/0.5ML IM SUSR
INTRAMUSCULAR | 1 refills | Status: DC
  Filled 2021-10-05: qty 0.5, 1d supply, fill #0

## 2021-10-17 ENCOUNTER — Other Ambulatory Visit: Payer: Medicare Other

## 2021-10-18 ENCOUNTER — Other Ambulatory Visit: Payer: Medicare Other

## 2021-10-18 DIAGNOSIS — Z79899 Other long term (current) drug therapy: Secondary | ICD-10-CM

## 2021-10-18 DIAGNOSIS — E063 Autoimmune thyroiditis: Secondary | ICD-10-CM | POA: Diagnosis not present

## 2021-10-18 DIAGNOSIS — D62 Acute posthemorrhagic anemia: Secondary | ICD-10-CM | POA: Diagnosis not present

## 2021-10-18 DIAGNOSIS — E038 Other specified hypothyroidism: Secondary | ICD-10-CM | POA: Diagnosis not present

## 2021-10-18 DIAGNOSIS — I10 Essential (primary) hypertension: Secondary | ICD-10-CM

## 2021-10-19 LAB — COMPREHENSIVE METABOLIC PANEL
AG Ratio: 1.4 (calc) (ref 1.0–2.5)
ALT: 18 U/L (ref 6–29)
AST: 21 U/L (ref 10–35)
Albumin: 4.2 g/dL (ref 3.6–5.1)
Alkaline phosphatase (APISO): 71 U/L (ref 37–153)
BUN/Creatinine Ratio: 57 (calc) — ABNORMAL HIGH (ref 6–22)
BUN: 37 mg/dL — ABNORMAL HIGH (ref 7–25)
CO2: 27 mmol/L (ref 20–32)
Calcium: 9.8 mg/dL (ref 8.6–10.4)
Chloride: 98 mmol/L (ref 98–110)
Creat: 0.65 mg/dL (ref 0.60–1.00)
Globulin: 3.1 g/dL (calc) (ref 1.9–3.7)
Glucose, Bld: 92 mg/dL (ref 65–99)
Potassium: 5 mmol/L (ref 3.5–5.3)
Sodium: 135 mmol/L (ref 135–146)
Total Bilirubin: 0.4 mg/dL (ref 0.2–1.2)
Total Protein: 7.3 g/dL (ref 6.1–8.1)

## 2021-10-19 LAB — CBC WITH DIFFERENTIAL/PLATELET
Absolute Monocytes: 655 cells/uL (ref 200–950)
Basophils Absolute: 102 cells/uL (ref 0–200)
Basophils Relative: 1.2 %
Eosinophils Absolute: 451 cells/uL (ref 15–500)
Eosinophils Relative: 5.3 %
HCT: 39.3 % (ref 35.0–45.0)
Hemoglobin: 13.2 g/dL (ref 11.7–15.5)
Lymphs Abs: 2083 cells/uL (ref 850–3900)
MCH: 29.3 pg (ref 27.0–33.0)
MCHC: 33.6 g/dL (ref 32.0–36.0)
MCV: 87.1 fL (ref 80.0–100.0)
MPV: 10 fL (ref 7.5–12.5)
Monocytes Relative: 7.7 %
Neutro Abs: 5211 cells/uL (ref 1500–7800)
Neutrophils Relative %: 61.3 %
Platelets: 313 10*3/uL (ref 140–400)
RBC: 4.51 10*6/uL (ref 3.80–5.10)
RDW: 13.2 % (ref 11.0–15.0)
Total Lymphocyte: 24.5 %
WBC: 8.5 10*3/uL (ref 3.8–10.8)

## 2021-10-19 LAB — VALPROIC ACID LEVEL: Valproic Acid Lvl: 20.2 mg/L — ABNORMAL LOW (ref 50.0–100.0)

## 2021-10-19 LAB — TSH: TSH: 0.27 mIU/L — ABNORMAL LOW (ref 0.40–4.50)

## 2021-10-20 ENCOUNTER — Encounter: Payer: Self-pay | Admitting: Orthopedic Surgery

## 2021-10-20 ENCOUNTER — Ambulatory Visit (INDEPENDENT_AMBULATORY_CARE_PROVIDER_SITE_OTHER): Payer: Medicare Other | Admitting: Orthopedic Surgery

## 2021-10-20 VITALS — BP 122/70 | HR 94 | Temp 96.8°F | Resp 20 | Ht 62.75 in | Wt 150.4 lb

## 2021-10-20 DIAGNOSIS — D510 Vitamin B12 deficiency anemia due to intrinsic factor deficiency: Secondary | ICD-10-CM | POA: Diagnosis not present

## 2021-10-20 DIAGNOSIS — E063 Autoimmune thyroiditis: Secondary | ICD-10-CM

## 2021-10-20 DIAGNOSIS — F319 Bipolar disorder, unspecified: Secondary | ICD-10-CM | POA: Diagnosis not present

## 2021-10-20 DIAGNOSIS — F419 Anxiety disorder, unspecified: Secondary | ICD-10-CM

## 2021-10-20 DIAGNOSIS — M810 Age-related osteoporosis without current pathological fracture: Secondary | ICD-10-CM | POA: Diagnosis not present

## 2021-10-20 DIAGNOSIS — E038 Other specified hypothyroidism: Secondary | ICD-10-CM

## 2021-10-20 DIAGNOSIS — R252 Cramp and spasm: Secondary | ICD-10-CM | POA: Diagnosis not present

## 2021-10-20 DIAGNOSIS — I10 Essential (primary) hypertension: Secondary | ICD-10-CM | POA: Diagnosis not present

## 2021-10-20 DIAGNOSIS — F338 Other recurrent depressive disorders: Secondary | ICD-10-CM

## 2021-10-20 MED ORDER — LEVOTHYROXINE SODIUM 100 MCG PO TABS: 100.0000 ug | ORAL_TABLET | Freq: Every day | ORAL | 3 refills | Status: DC

## 2021-10-20 MED ORDER — CYANOCOBALAMIN 1000 MCG/ML IJ SOLN: INTRAMUSCULAR | 0 refills | Status: DC

## 2021-10-20 MED ORDER — METHOCARBAMOL 500 MG PO TABS: 250.0000 mg | ORAL_TABLET | Freq: Every day | ORAL | 1 refills | Status: DC | PRN

## 2021-10-20 NOTE — Progress Notes (Signed)
Careteam: Patient Care Team: Jackie Alanis, NP as PCP - General (Adult Health Nurse Practitioner)  Seen by: Windell Moulding, AGNP-C  PLACE OF SERVICE:  [place]  Advanced Directive information    Allergies  Allergen Reactions   Contrast Media [Iodinated Contrast Media] Itching   Trintellix [Vortioxetine] Other (See Comments)    Led to mania   Dairycare [Lactase-Lactobacillus]    Doxycycline Hyclate Nausea And Vomiting    Double vision   Soy Allergy Itching   Ciprofloxacin Rash   Levaquin [Levofloxacin] Rash   Penicillins Rash    Chief Complaint  Patient presents with   Medical Management of Chronic Issues    Patient presents today for 6 month follow-up.     HPI: Patient is a 76 y.o. female seen today for   Still living at [facility] in [place].   MMSE 30/30, correct clock and shapes.   Lab results discussed.   Weight gain- weight up 11 lbs from last encounter, admits to eat more cheese, she has been walking [place]- about 4-5 miles  DEXA 05/2021, no recent falls or injuries, taking fosamax  Reports mild muscle cramping after RATH. Requesting robaxin prn.   Still seeing new therapist, no mood changes or panic attacks.   Discussed getting flu vaccine in fall.         Review of Systems:  Review of Systems  Constitutional:  Negative for chills, fever, malaise/fatigue and weight loss.  HENT:  Negative for hearing loss and sore throat.   Eyes: Negative.  Negative for blurred vision and double vision.  Respiratory:  Negative for cough, shortness of breath and wheezing.   Cardiovascular:  Negative for chest pain and leg swelling.  Gastrointestinal:  Negative for abdominal pain, blood in stool, constipation, diarrhea, heartburn, nausea and vomiting.  Genitourinary:  Negative for dysuria.  Musculoskeletal:  Positive for myalgias. Negative for falls and joint pain.  Skin:  Negative for rash.  Neurological:  Negative for dizziness, weakness and headaches.   Psychiatric/Behavioral:  Positive for depression. Negative for memory loss. The patient is nervous/anxious. The patient does not have insomnia.     Past Medical History:  Diagnosis Date   Allergy    Anxiety 2000   Arthritis    Bipolar 1 disorder, depressed ([place])    Bipolar disorder ([place])    Chicken pox    Dementia ([place])    Depression    Dysrhythmia 05/10/2020   pt states "cardiologist told me it is not dangerous, it's an anomaly"   Hashimoto's thyroiditis    History of blood transfusion    History of bone density study 2019   History of colonoscopy 2015   History of CT scan 2019   History of mammogram 2019   History of MRI    History of Papanicolaou smear of cervix    History of right hip replacement    Hx: UTI (urinary tract infection)    Hypertension    Hypothyroidism    Lactose intolerance    Legally blind in left eye, as defined in [place]    Non-celiac gluten sensitivity    Osteoporosis    Pernicious anemia    Pernicious anemia    Rheumatic fever    Thyroid disease    Urinary and fecal incontinence    Past Surgical History:  Procedure Laterality Date   [place]   EYE SURGERY Bilateral 2014   cataracts   LEFT HEART CATH  AND CORONARY ANGIOGRAPHY N/A 05/10/2020   Procedure: LEFT HEART CATH AND CORONARY ANGIOGRAPHY;  Surgeon: Nelva Bush, MD;  Location: [place] [facility];  Service: Cardiovascular;  Laterality: N/A;   prolapsed rectum 2015     TONSILLECTOMY AND ADENOIDECTOMY  1966   TOTAL HIP ARTHROPLASTY Right 11/23/2020   Procedure: RIGHT TOTAL HIP ARTHROPLASTY ANTERIOR APPROACH;  Surgeon: Mcarthur Rossetti, MD;  Location: [place];  Service: Orthopedics;  Laterality: Right;   TUBAL LIGATION     Social History:   reports that she has never smoked. She has never used smokeless tobacco. She reports that she does not drink alcohol and does not use drugs.  Family History  Problem Relation Age of Onset   Heart  disease Mother    Arthritis Mother    Cancer Father        bladder   Thyroid disease Father    Dementia Father    Hypertension Sister    Hypertension Brother    Hypertension Sister    Hypertension Sister    Diabetes Brother    Diabetes type II Son     Medications: Patient's Medications  New Prescriptions   No medications on file  Previous Medications   ACETAMINOPHEN (TYLENOL) 500 MG TABLET    Take 1,000 mg by mouth in the morning and at bedtime.   ALENDRONATE (FOSAMAX) 70 MG TABLET    TAKE 1 TABLET(70 MG) BY MOUTH 1 TIME A WEEK WITH A FULL GLASS OF WATER AND ON AN EMPTY STOMACH   AMLODIPINE (NORVASC) 10 MG TABLET    Take 1 tablet by mouth once daily   ASPIRIN 81 MG CHEWABLE TABLET    Chew 1 tablet (81 mg total) by mouth 2 (two) times daily.   CALCIUM-VITAMIN D-VITAMIN K (VIACTIV CALCIUM PLUS D) 650-12.5-40 MG-MCG-MCG CHEW    Chew 2 tablets by mouth daily.   CYANOCOBALAMIN (,VITAMIN B-12,) 1000 MCG/ML INJECTION    INJECT 1ML INTO THE MUSCLE EVERY 30 DAYS   DIVALPROEX (DEPAKOTE ER) 500 MG 24 HR TABLET    Take 1 tablet (500 mg total) by mouth at bedtime.   EMOLLIENT (AVEENO RESTORATIVE SKIN THERAP) CREA    Apply 1 application topically daily.   HALOBETASOL (ULTRAVATE) 0.05 % OINTMENT    Apply 1 application topically daily as needed (eczema).   LEVOTHYROXINE (SYNTHROID) 112 MCG TABLET    Take 1 tablet by mouth once daily   LORATADINE (CLARITIN) 10 MG TABLET    Take 10 mg by mouth daily.   MULTIPLE VITAMINS-MINERALS (CENTRUM SILVER ADULT 50+ PO)    Take 1 tablet by mouth daily.   MULTIPLE VITAMINS-MINERALS (PRESERVISION AREDS) CAPS    Take 1 capsule by mouth in the morning and at bedtime.   OLMESARTAN (BENICAR) 20 MG TABLET    Take one tablet by mouth once daily.   POLYETHYL GLYCOL-PROPYL GLYCOL 0.4-0.3 % SOLN    Place 1 drop into both eyes in the morning and at bedtime.   SYRINGE-NEEDLE, DISP, 3 ML (BD SAFETYGLIDE SYRINGE/NEEDLE) 25G X 1" 3 ML MISC    Inject 2m in deltoid once monthly    VENLAFAXINE XR (EFFEXOR-XR) 150 MG 24 HR CAPSULE    Take 1 capsule (150 mg total) by mouth daily with breakfast.   WHEAT DEXTRIN (BENEFIBER PO)    Take by mouth. Take one teaspoon daily  Modified Medications   No medications on file  Discontinued Medications   CELECOXIB (CELEBREX) 50 MG CAPSULE    Take 1 capsule (50 mg  total) by mouth 2 (two) times daily.   COVID-19 MRNA BIVALENT VACCINE, PFIZER, (PFIZER COVID-19 VAC BIVALENT) INJECTION    Inject into the muscle.   ZOSTER VACCINE ADJUVANTED [facility]) INJECTION    Inject into the muscle.    Physical Exam:  Vitals:   10/20/21 1044  BP: 122/70  Pulse: 94  Resp: 20  Temp: (!) 96.8 F (36 C)  SpO2: 98%  Weight: 150 lb 6.4 oz (68.2 kg)  Height: 5' 2.75" (1.594 m)   Body mass index is 26.85 kg/m. Wt Readings from Last 3 Encounters:  10/20/21 150 lb 6.4 oz (68.2 kg)  04/14/21 139 lb 6.4 oz (63.2 kg)  02/21/21 139 lb 4 oz (63.2 kg)    Physical Exam Vitals reviewed.  Constitutional:      General: She is not in acute distress. HENT:     Head: Normocephalic.  Eyes:     General:        Right eye: No discharge.        Left eye: No discharge.  Neck:     Thyroid: No thyroid mass or thyromegaly.  Cardiovascular:     Rate and Rhythm: Normal rate and regular rhythm.     Pulses: Normal pulses.     Heart sounds: Normal heart sounds.  Pulmonary:     Effort: Pulmonary effort is normal. No respiratory distress.     Breath sounds: Normal breath sounds. No wheezing.  Abdominal:     General: Bowel sounds are normal. There is no distension.     Palpations: Abdomen is soft.     Tenderness: There is no abdominal tenderness.  Musculoskeletal:     Cervical back: Neck supple.     Right lower leg: No edema.     Left lower leg: No edema.  Skin:    General: Skin is warm and dry.     Capillary Refill: Capillary refill takes less than 2 seconds.  Neurological:     General: No focal deficit present.     Mental Status: She is alert and  oriented to person, place, and time.     Motor: No weakness.     Gait: Gait normal.  Psychiatric:        Mood and Affect: Mood normal.        Behavior: Behavior normal.     Labs reviewed: Basic Metabolic Panel: Recent Labs    01/03/21 0903 04/11/21 0846 10/18/21 0920  NA 135 135 135  K 4.8 4.6 5.0  CL 98 99 98  CO2 '28 30 27  '$ GLUCOSE 89 98 92  BUN 25 24 37*  CREATININE 0.68 0.64 0.65  CALCIUM 9.7 9.5 9.8  TSH 3.72  --  0.27*   Liver Function Tests: Recent Labs    04/11/21 0846 10/18/21 0920  AST 19 21  ALT 15 18  BILITOT 0.4 0.4  PROT 6.9 7.3   No results for input(s): "LIPASE", "AMYLASE" in the last 8760 hours. No results for input(s): "AMMONIA" in the last 8760 hours. CBC: Recent Labs    01/03/21 0903 04/11/21 0846 10/18/21 0920  WBC 8.1 5.9 8.5  NEUTROABS 4,690 3,157 5,211  HGB 9.6* 11.5* 13.2  HCT 30.4* 36.5 39.3  MCV 84.7 78.3* 87.1  PLT 449* 275 313   Lipid Panel: No results for input(s): "CHOL", "HDL", "LDLCALC", "TRIG", "CHOLHDL", "LDLDIRECT" in the last 8760 hours. TSH: Recent Labs    01/03/21 0903 10/18/21 0920  TSH 3.72 0.27*   A1C: No results found for: "HGBA1C"  Assessment/Plan 1. Pernicious anemia - hgb 13.2, MCV 87.1, platelets 313 10/18/2021 - cyanocobalamin (VITAMIN B12) 1000 MCG/ML injection; INJECT 1ML INTO THE MUSCLE EVERY 30 DAYS  Dispense: 3 mL; Refill: 0  2. Muscle cramps - ongoing- began after [facility] 11/2020 - methocarbamol (ROBAXIN) 500 MG tablet; Take 0.5 tablets (250 mg total) by mouth daily as needed for muscle spasms.  Dispense: 30 tablet; Refill: 1  3. Hypothyroidism due to Hashimoto's thyroiditis - TSH 0.27 10/18/2021 - will reduce levothyroxine to 100 mcg daily - levothyroxine (SYNTHROID) 100 MCG tablet; Take 1 tablet (100 mcg total) by mouth daily.  Dispense: 90 tablet; Refill: 3  4. Age-related osteoporosis without current pathological fracture - DEXA 05/2021, t score -2.5 - cont fosamax weekly - cont  Viactiv chew - cont weight bearing exercise  5. Bipolar 1 disorder, depressed ([place]) - followed by psychotherapy - no mood fluctuations - Valproic level 20.2 10/18/2021 - cont Depakote and Effexor  6. Seasonal affective disorder ([place]) - see above - advised to use sun lamp in winter months  7. Essential hypertension - controlled - cont Benicar  8. Anxiety - no panic attacks  Total time: 32 minutes. Greater than 50% of total time spent doing patient education regarding health maintenance, muscle cramps, lab results, falls prevention, symptom/medication management.   Next appt: Visit date not found  [place], [facility] [phone]

## 2021-10-20 NOTE — Patient Instructions (Signed)
Levothyroxine reduced to 100 mcg daily- take on empty stomach  Robaxin prn for muscle cramps/ seasonal issues  Please get flu shot this fall

## 2021-10-27 NOTE — Addendum Note (Signed)
Addended byWindell Moulding E on: 10/27/2021 12:48 PM   Modules accepted: Level of Service

## 2021-11-02 DIAGNOSIS — F3175 Bipolar disorder, in partial remission, most recent episode depressed: Secondary | ICD-10-CM | POA: Diagnosis not present

## 2021-11-04 DIAGNOSIS — F3131 Bipolar disorder, current episode depressed, mild: Secondary | ICD-10-CM | POA: Diagnosis not present

## 2021-11-16 DIAGNOSIS — F3175 Bipolar disorder, in partial remission, most recent episode depressed: Secondary | ICD-10-CM | POA: Diagnosis not present

## 2021-11-25 ENCOUNTER — Other Ambulatory Visit: Payer: Self-pay | Admitting: Orthopedic Surgery

## 2021-11-25 DIAGNOSIS — I10 Essential (primary) hypertension: Secondary | ICD-10-CM

## 2021-11-29 DIAGNOSIS — F3175 Bipolar disorder, in partial remission, most recent episode depressed: Secondary | ICD-10-CM | POA: Diagnosis not present

## 2021-12-16 DIAGNOSIS — Z23 Encounter for immunization: Secondary | ICD-10-CM | POA: Diagnosis not present

## 2021-12-19 ENCOUNTER — Other Ambulatory Visit: Payer: Self-pay | Admitting: Orthopedic Surgery

## 2021-12-19 DIAGNOSIS — I10 Essential (primary) hypertension: Secondary | ICD-10-CM

## 2021-12-20 DIAGNOSIS — F3175 Bipolar disorder, in partial remission, most recent episode depressed: Secondary | ICD-10-CM | POA: Diagnosis not present

## 2021-12-26 DIAGNOSIS — Z23 Encounter for immunization: Secondary | ICD-10-CM | POA: Diagnosis not present

## 2022-01-04 ENCOUNTER — Other Ambulatory Visit (HOSPITAL_BASED_OUTPATIENT_CLINIC_OR_DEPARTMENT_OTHER): Payer: Self-pay

## 2022-01-04 MED ORDER — ZOSTER VAC RECOMB ADJUVANTED 50 MCG/0.5ML IM SUSR
INTRAMUSCULAR | 0 refills | Status: DC
  Filled 2022-01-04: qty 0.5, 1d supply, fill #0

## 2022-01-07 ENCOUNTER — Other Ambulatory Visit: Payer: Self-pay | Admitting: Orthopedic Surgery

## 2022-01-07 DIAGNOSIS — D510 Vitamin B12 deficiency anemia due to intrinsic factor deficiency: Secondary | ICD-10-CM

## 2022-01-17 ENCOUNTER — Encounter: Payer: Self-pay | Admitting: Orthopedic Surgery

## 2022-01-17 ENCOUNTER — Other Ambulatory Visit: Payer: Self-pay | Admitting: Orthopedic Surgery

## 2022-01-17 DIAGNOSIS — F3175 Bipolar disorder, in partial remission, most recent episode depressed: Secondary | ICD-10-CM | POA: Diagnosis not present

## 2022-01-17 DIAGNOSIS — R0982 Postnasal drip: Secondary | ICD-10-CM

## 2022-01-17 MED ORDER — IPRATROPIUM BROMIDE 0.03 % NA SOLN: 2.0000 | Freq: Two times a day (BID) | NASAL | 3 refills | Status: DC

## 2022-02-07 DIAGNOSIS — F3175 Bipolar disorder, in partial remission, most recent episode depressed: Secondary | ICD-10-CM | POA: Diagnosis not present

## 2022-02-16 ENCOUNTER — Other Ambulatory Visit: Payer: Self-pay | Admitting: Orthopedic Surgery

## 2022-02-16 DIAGNOSIS — I10 Essential (primary) hypertension: Secondary | ICD-10-CM

## 2022-03-16 ENCOUNTER — Encounter: Payer: Self-pay | Admitting: Orthopedic Surgery

## 2022-03-16 ENCOUNTER — Encounter: Payer: Self-pay | Admitting: Adult Health

## 2022-03-16 ENCOUNTER — Ambulatory Visit (INDEPENDENT_AMBULATORY_CARE_PROVIDER_SITE_OTHER): Payer: Medicare Other | Admitting: Adult Health

## 2022-03-16 VITALS — BP 118/72 | HR 65 | Temp 97.3°F | Ht 62.08 in | Wt 145.0 lb

## 2022-03-16 DIAGNOSIS — H8110 Benign paroxysmal vertigo, unspecified ear: Secondary | ICD-10-CM | POA: Diagnosis not present

## 2022-03-16 MED ORDER — MECLIZINE HCL 50 MG PO TABS: 50.0000 mg | ORAL_TABLET | Freq: Four times a day (QID) | ORAL | 0 refills | Status: AC | PRN

## 2022-03-16 NOTE — Patient Instructions (Signed)
When standing up go slow; do not walk until dizziness go away May have meclizine 25 mg every 6 hours as needed Call the office if not improve in the next 2 weeks.

## 2022-03-16 NOTE — Progress Notes (Signed)
Location:  [facility]   Place of Service:   [place]      Allergies  Allergen Reactions   Contrast Media [Iodinated Contrast Media] Itching   Trintellix [Vortioxetine] Other (See Comments)    Led to mania   Dairycare [Lactase-Lactobacillus]    Doxycycline Hyclate Nausea And Vomiting    Double vision   Soy Allergy Itching   Ciprofloxacin Rash   Levaquin [Levofloxacin] Rash   Penicillins Rash    Chief Complaint  Patient presents with   Acute Visit    Patient c/o dizziness and nausea x 1 week, patient has these episodes off/on.  Patient denies vomiting.     HPI:  Or the past week she has had episodes of vertigo with nausea present. She has not had any falls. The symptoms started suddenly. She is able to ambulate daily. Sitting still makes the symptoms better. Nothing seems to make them worse. She is most dizzy with standing up. Her balance testing is normal; with no drift present. Her finger to nose is normal as well. Her neuro exam is normal. There have been no loss of consciousness present.    Past Medical History:  Diagnosis Date   Allergy    Anxiety 2000   Arthritis    Bipolar 1 disorder, depressed ([place])    Bipolar disorder ([place])    Chicken pox    Dementia ([place])    Depression    Dysrhythmia 05/10/2020   pt states "cardiologist told me it is not dangerous, it's an anomaly"   Hashimoto's thyroiditis    History of blood transfusion    History of bone density study 2019   History of colonoscopy 2015   History of CT scan 2019   History of mammogram 2019   History of MRI    History of Papanicolaou smear of cervix    History of right hip replacement    Hx: UTI (urinary tract infection)    Hypertension    Hypothyroidism    Lactose intolerance    Legally blind in left eye, as defined in [place]    Non-celiac gluten sensitivity    Osteoporosis    Pernicious anemia    Pernicious anemia    Rheumatic fever    Thyroid disease    Urinary and fecal incontinence      Past Surgical History:  Procedure Laterality Date   [place]   EYE SURGERY Bilateral 2014   cataracts   LEFT HEART CATH AND CORONARY ANGIOGRAPHY N/A 05/10/2020   Procedure: LEFT HEART CATH AND CORONARY ANGIOGRAPHY;  Surgeon: Nelva Bush, MD;  Location: [facility];  Service: Cardiovascular;  Laterality: N/A;   prolapsed rectum 2015     TONSILLECTOMY AND ADENOIDECTOMY  1966   TOTAL HIP ARTHROPLASTY Right 11/23/2020   Procedure: RIGHT TOTAL HIP ARTHROPLASTY ANTERIOR APPROACH;  Surgeon: Mcarthur Rossetti, MD;  Location: [facility];  Service: Orthopedics;  Laterality: Right;   TUBAL LIGATION      Social History   Socioeconomic History   Marital status: Single    Spouse name: Not on file   Number of children: 1   Years of education: Not on file   Highest education level: Not on file  Occupational History   Not on file  Tobacco Use   Smoking status: Never   Smokeless tobacco: Never  Vaping Use   Vaping Use: Never used  Substance and Sexual Activity   Alcohol use:  Never   Drug use: Never   Sexual activity: Not Currently  Other Topics Concern   Not on file  Social History Narrative   Tobacco use, amount per day now: None.   Past tobacco use, amount per day: None.   How many years did you use tobacco: None.   Alcohol use (drinks per week): None.   Diet: I eat a low fat diet. Much in vegetables.    Do you drink/eat things with caffeine: Yes.   Marital status:  Divorced                                What year were you married? 1976   Do you live in a house, apartment, assisted living, condo, trailer, etc.? Rent small house.   Is it one or more stories? One.   How many persons live in your home? I live alone.    Do you have pets in your home?( please list) No.   Highest Level of education completed? Masters Degree Plus.   Current or past profession: Pharmacist, hospital.   Do you exercise? Yes.                                   Type and how often? 5 days a week I walk.    Do you have a living will? Yes.   Do you have a DNR form?    Yes                               If not, do you want to discuss one?   Do you have signed POA/HPOA forms?  Yes.                      If so, please bring to you appointment      Do you have any difficulty bathing or dressing yourself? No.   Do you have any difficulty preparing food or eating? No.   Do you have any difficulty managing your medications? No.   Do you have any difficulty managing your finances? Yes.   Do you have any difficulty affording your medications? No.   Social Determinants of Health   Financial Resource Strain: Low Risk  (06/30/2021)   Overall Financial Resource Strain (CARDIA)    Difficulty of Paying Living Expenses: Not hard at all  Food Insecurity: No Food Insecurity (06/30/2021)   Hunger Vital Sign    Worried About Running Out of Food in the Last Year: Never true    Ran Out of Food in the Last Year: Never true  Transportation Needs: No Transportation Needs (06/30/2021)   PRAPARE - Hydrologist (Medical): No    Lack of Transportation (Non-Medical): No  Physical Activity: Sufficiently Active (06/30/2021)   Exercise Vital Sign    Days of Exercise per Week: 6 days    Minutes of Exercise per Session: 30 min  Stress: No Stress Concern Present (06/30/2021)   [place]    Feeling of Stress : Not at all  Social Connections: Moderately Integrated (06/30/2021)   Social Connection and Isolation Panel [NHANES]    Frequency of Communication with Friends and Family: More than three times a week    Frequency of Social Gatherings  with Friends and Family: Twice a week    Attends Religious Services: More than 4 times per year    Active Member of Clubs or Organizations: Yes    Attends Music therapist: More than 4 times per year    Marital Status: Divorced  Intimate  Partner Violence: Not At Risk (06/30/2021)   Humiliation, Afraid, Rape, and Kick questionnaire    Fear of Current or Ex-Partner: No    Emotionally Abused: No    Physically Abused: No    Sexually Abused: No   Family History  Problem Relation Age of Onset   Heart disease Mother    Arthritis Mother    Cancer Father        bladder   Thyroid disease Father    Dementia Father    Hypertension Sister    Hypertension Brother    Hypertension Sister    Hypertension Sister    Diabetes Brother    Diabetes type II Son       VITAL SIGNS BP 118/72 (BP Location: Left Arm, Patient Position: Sitting, Cuff Size: Normal)   Pulse 65   Temp (!) 97.3 F (36.3 C) (Temporal)   Ht 5' 2.08" (1.577 m)   Wt 145 lb (65.8 kg)   SpO2 96%   BMI 26.46 kg/m   Outpatient Encounter Medications as of 03/16/2022  Medication Sig   acetaminophen (TYLENOL) 500 MG tablet Take 1,000 mg by mouth in the morning and at bedtime.   alendronate (FOSAMAX) 70 MG tablet TAKE 1 TABLET(70 MG) BY MOUTH 1 TIME A WEEK WITH A FULL GLASS OF WATER AND ON AN EMPTY STOMACH   amLODipine (NORVASC) 10 MG tablet Take 1 tablet by mouth once daily   aspirin EC 81 MG tablet Take 81 mg by mouth daily. Swallow whole.   Calcium-Vitamin D-Vitamin K (VIACTIV CALCIUM PLUS D) 650-12.5-40 MG-MCG-MCG CHEW Chew 2 tablets by mouth daily.   cyanocobalamin (VITAMIN B12) 1000 MCG/ML injection INJECT 1 ML INTRAMUSCULARLY  ONCE EVERY MONTH   divalproex (DEPAKOTE ER) 500 MG 24 hr tablet Take 1 tablet (500 mg total) by mouth at bedtime.   Emollient (AVEENO RESTORATIVE SKIN THERAP) CREA Apply 1 application topically daily.   halobetasol (ULTRAVATE) 0.05 % ointment Apply 1 application topically daily as needed (eczema).   ipratropium (ATROVENT) 0.03 % nasal spray Place 2 sprays into both nostrils every 12 (twelve) hours.   levothyroxine (SYNTHROID) 100 MCG tablet Take 1 tablet (100 mcg total) by mouth daily.   loratadine (CLARITIN) 10 MG tablet Take 10 mg by  mouth daily.   Multiple Vitamins-Minerals (CENTRUM SILVER ADULT 50+ PO) Take 1 tablet by mouth daily.   Multiple Vitamins-Minerals (PRESERVISION AREDS) CAPS Take 1 capsule by mouth in the morning and at bedtime.   olmesartan (BENICAR) 20 MG tablet Take 1 tablet by mouth once daily   SYRINGE-NEEDLE, DISP, 3 ML (BD SAFETYGLIDE SYRINGE/NEEDLE) 25G X 1" 3 ML MISC Inject 53m in deltoid once monthly   venlafaxine XR (EFFEXOR-XR) 150 MG 24 hr capsule Take 1 capsule (150 mg total) by mouth daily with breakfast.   Wheat Dextrin (BENEFIBER PO) Take by mouth. Take one teaspoon daily   [DISCONTINUED] aspirin 81 MG chewable tablet Chew 1 tablet (81 mg total) by mouth 2 (two) times daily. (Patient not taking: Reported on 03/16/2022)   [DISCONTINUED] methocarbamol (ROBAXIN) 500 MG tablet Take 0.5 tablets (250 mg total) by mouth daily as needed for muscle spasms.   [DISCONTINUED] Polyethyl Glycol-Propyl Glycol 0.4-0.3 % SOLN Place  1 drop into both eyes in the morning and at bedtime. (Patient not taking: Reported on 03/16/2022)   [DISCONTINUED] Zoster Vaccine Adjuvanted [facility]) injection Inject into the muscle. (Patient not taking: Reported on 03/16/2022)   No facility-administered encounter medications on file as of 03/16/2022.     SIGNIFICANT DIAGNOSTIC EXAMS  Review of Systems  Constitutional:  Negative for malaise/fatigue.  Respiratory:  Negative for cough and shortness of breath.   Cardiovascular:  Negative for chest pain, palpitations and leg swelling.  Gastrointestinal:  Positive for nausea. Negative for abdominal pain, constipation, heartburn and vomiting.  Musculoskeletal:  Negative for back pain, joint pain and myalgias.  Skin: Negative.   Neurological:  Positive for dizziness. Negative for sensory change, speech change, weakness and headaches.  Psychiatric/Behavioral:  The patient is not nervous/anxious.      Physical Exam Constitutional:      General: She is not in acute distress.     Appearance: She is well-developed. She is not diaphoretic.  Neck:     Thyroid: No thyromegaly.  Cardiovascular:     Rate and Rhythm: Normal rate and regular rhythm.     Pulses: Normal pulses.     Heart sounds: Normal heart sounds.  Pulmonary:     Effort: Pulmonary effort is normal. No respiratory distress.     Breath sounds: Normal breath sounds.  Abdominal:     General: Bowel sounds are normal. There is no distension.     Palpations: Abdomen is soft.     Tenderness: There is no abdominal tenderness.  Musculoskeletal:        General: Normal range of motion.     Cervical back: Normal range of motion and neck supple.     Right lower leg: No edema.     Left lower leg: No edema.  Lymphadenopathy:     Cervical: No cervical adenopathy.  Skin:    General: Skin is warm and dry.  Neurological:     Mental Status: She is alert and oriented to person, place, and time.     Cranial Nerves: No cranial nerve deficit.     Sensory: No sensory deficit.     Motor: No weakness.     Coordination: Coordination normal.     Gait: Gait normal.     Deep Tendon Reflexes: Reflexes normal.  Psychiatric:        Mood and Affect: Mood normal.       ASSESSMENT/ PLAN:  TODAY  Benign positional vertigo: will begin antivert 25 mg every 8 hours as needed she is to call the office is her symptoms do not improve.    Ok Edwards NP [facility]  call [phone]

## 2022-03-21 ENCOUNTER — Other Ambulatory Visit: Payer: Self-pay | Admitting: Orthopedic Surgery

## 2022-03-21 DIAGNOSIS — I10 Essential (primary) hypertension: Secondary | ICD-10-CM

## 2022-04-13 DIAGNOSIS — F3175 Bipolar disorder, in partial remission, most recent episode depressed: Secondary | ICD-10-CM | POA: Diagnosis not present

## 2022-04-21 DIAGNOSIS — F3131 Bipolar disorder, current episode depressed, mild: Secondary | ICD-10-CM | POA: Diagnosis not present

## 2022-04-21 DIAGNOSIS — F419 Anxiety disorder, unspecified: Secondary | ICD-10-CM | POA: Diagnosis not present

## 2022-04-27 ENCOUNTER — Encounter: Payer: Self-pay | Admitting: Nurse Practitioner

## 2022-04-27 ENCOUNTER — Ambulatory Visit (INDEPENDENT_AMBULATORY_CARE_PROVIDER_SITE_OTHER): Payer: Medicare Other | Admitting: Nurse Practitioner

## 2022-04-27 ENCOUNTER — Ambulatory Visit: Payer: Medicare Other | Admitting: Orthopedic Surgery

## 2022-04-27 VITALS — BP 110/70 | HR 83 | Temp 96.8°F | Ht 62.0 in | Wt 143.6 lb

## 2022-04-27 DIAGNOSIS — M810 Age-related osteoporosis without current pathological fracture: Secondary | ICD-10-CM

## 2022-04-27 DIAGNOSIS — E038 Other specified hypothyroidism: Secondary | ICD-10-CM | POA: Diagnosis not present

## 2022-04-27 DIAGNOSIS — M542 Cervicalgia: Secondary | ICD-10-CM | POA: Diagnosis not present

## 2022-04-27 DIAGNOSIS — D510 Vitamin B12 deficiency anemia due to intrinsic factor deficiency: Secondary | ICD-10-CM | POA: Diagnosis not present

## 2022-04-27 DIAGNOSIS — E063 Autoimmune thyroiditis: Secondary | ICD-10-CM | POA: Diagnosis not present

## 2022-04-27 DIAGNOSIS — R0982 Postnasal drip: Secondary | ICD-10-CM

## 2022-04-27 DIAGNOSIS — F338 Other recurrent depressive disorders: Secondary | ICD-10-CM | POA: Diagnosis not present

## 2022-04-27 DIAGNOSIS — I10 Essential (primary) hypertension: Secondary | ICD-10-CM

## 2022-04-27 MED ORDER — IPRATROPIUM BROMIDE 0.03 % NA SOLN: 2.0000 | Freq: Two times a day (BID) | NASAL | 3 refills | Status: AC

## 2022-04-27 NOTE — Progress Notes (Signed)
Careteam: Patient Care Team: Yvonna Alanis, NP as PCP - General (Adult Health Nurse Practitioner)  PLACE OF SERVICE:  [place] Directive information Does Patient Have a Medical Advance Directive?: Yes, Type of Advance Directive: [place], Does patient want to make changes to medical advance directive?: No - Patient declined  Allergies  Allergen Reactions   Contrast Media [Iodinated Contrast Media] Itching   Trintellix [Vortioxetine] Other (See Comments)    Led to mania   Dairycare [Lactase-Lactobacillus]    Doxycycline Hyclate Nausea And Vomiting    Double vision   Soy Allergy Itching   Ciprofloxacin Rash   Levaquin [Levofloxacin] Rash   Penicillins Rash    Chief Complaint  Patient presents with   Medical Management of Chronic Issues    6 month follow up. Patient request PT for neck. Patient has pinched nerve.  Patient has pain in neck when she moves her head a certain way.   Immunizations    Patient due for a COVID booster.     HPI: Patient is a 77 y.o. female here for routine follow up.  Reports she is doing great.    Osteoporosis- on fosamax [place]   Htn- controlled   Hypothyroid  Mood- bipolar disorder, generally depressed- followed by psych She is keeping appt with her therapist and SAD has not effected her that bad this winter.  She is thinking more positively and doing routine exercise.  "Important to stay mindful"  Pernicious anemia- getting b12 injections every 30 days  Reports she sat up the wrong way one morning about 1 month ago. Neck hurts when she moves a certain way.  Pain 4/10. Reports she can live with it but prior to getting up had no pain at all. Overall pain is slowly improving.  Sharp pain when it occurs.  Does not effect ADLs or sleep   Review of Systems:  Review of Systems  Constitutional:  Negative for chills, fever and weight loss.  HENT:  Negative for tinnitus.   Respiratory:  Negative for cough,  sputum production and shortness of breath.   Cardiovascular:  Negative for chest pain, palpitations and leg swelling.  Gastrointestinal:  Negative for abdominal pain, constipation, diarrhea and heartburn.  Genitourinary:  Negative for dysuria, frequency and urgency.  Musculoskeletal:  Negative for back pain, falls, joint pain and myalgias.  Skin: Negative.   Neurological:  Negative for dizziness and headaches.  Psychiatric/Behavioral:  Negative for depression and memory loss. The patient does not have insomnia.     Past Medical History:  Diagnosis Date   Allergy    Anxiety 2000   Arthritis    Bipolar 1 disorder, depressed ([place])    Bipolar disorder ([place])    Chicken pox    Dementia ([place])    Depression    Dysrhythmia 05/10/2020   pt states "cardiologist told me it is not dangerous, it's an anomaly"   Hashimoto's thyroiditis    History of blood transfusion    History of bone density study 2019   History of colonoscopy 2015   History of CT scan 2019   History of mammogram 2019   History of MRI    History of Papanicolaou smear of cervix    History of right hip replacement    Hx: UTI (urinary tract infection)    Hypertension    Hypothyroidism    Lactose intolerance    Legally blind in left eye, as defined in [place]    Non-celiac gluten  sensitivity    Osteoporosis    Pernicious anemia    Pernicious anemia    Rheumatic fever    Thyroid disease    Urinary and fecal incontinence    Past Surgical History:  Procedure Laterality Date   [place]   EYE SURGERY Bilateral 2014   cataracts   LEFT HEART CATH AND CORONARY ANGIOGRAPHY N/A 05/10/2020   Procedure: LEFT HEART CATH AND CORONARY ANGIOGRAPHY;  Surgeon: Nelva Bush, MD;  Location: [facility];  Service: Cardiovascular;  Laterality: N/A;   prolapsed rectum 2015     TONSILLECTOMY AND ADENOIDECTOMY  1966   TOTAL HIP ARTHROPLASTY Right 11/23/2020   Procedure: RIGHT TOTAL HIP  ARTHROPLASTY ANTERIOR APPROACH;  Surgeon: Mcarthur Rossetti, MD;  Location: [facility];  Service: Orthopedics;  Laterality: Right;   TUBAL LIGATION     Social History:   reports that she has never smoked. She has never used smokeless tobacco. She reports that she does not drink alcohol and does not use drugs.  Family History  Problem Relation Age of Onset   Heart disease Mother    Arthritis Mother    Cancer Father        bladder   Thyroid disease Father    Dementia Father    Hypertension Sister    Hypertension Brother    Hypertension Sister    Hypertension Sister    Diabetes Brother    Diabetes type II Son     Medications: Patient's Medications  New Prescriptions   No medications on file  Previous Medications   ACETAMINOPHEN (TYLENOL) 500 MG TABLET    Take 1,000 mg by mouth in the morning and at bedtime.   ALENDRONATE (FOSAMAX) 70 MG TABLET    TAKE 1 TABLET(70 MG) BY MOUTH 1 TIME A WEEK WITH A FULL GLASS OF WATER AND ON AN EMPTY STOMACH   AMLODIPINE (NORVASC) 10 MG TABLET    Take 1 tablet by mouth once daily   ASPIRIN EC 81 MG TABLET    Take 81 mg by mouth daily. Swallow whole.   CALCIUM-VITAMIN D-VITAMIN K (VIACTIV CALCIUM PLUS D) 650-12.5-40 MG-MCG-MCG CHEW    Chew 2 tablets by mouth daily.   CYANOCOBALAMIN (VITAMIN B12) 1000 MCG/ML INJECTION    INJECT 1 ML INTRAMUSCULARLY  ONCE EVERY MONTH   DIVALPROEX (DEPAKOTE ER) 500 MG 24 HR TABLET    Take 1 tablet (500 mg total) by mouth at bedtime.   EMOLLIENT (AVEENO RESTORATIVE SKIN THERAP) CREA    Apply 1 application topically daily.   HALOBETASOL (ULTRAVATE) 0.05 % OINTMENT    Apply 1 application topically daily as needed (eczema).   IPRATROPIUM (ATROVENT) 0.03 % NASAL SPRAY    Place 2 sprays into both nostrils every 12 (twelve) hours.   LEVOTHYROXINE (SYNTHROID) 100 MCG TABLET    Take 1 tablet (100 mcg total) by mouth daily.   LORATADINE (CLARITIN) 10 MG TABLET    Take 10 mg by mouth daily.   MECLIZINE (ANTIVERT) 50 MG TABLET     Take 1 tablet (50 mg total) by mouth every 6 (six) hours as needed.   MULTIPLE VITAMINS-MINERALS (CENTRUM SILVER ADULT 50+ PO)    Take 1 tablet by mouth daily.   MULTIPLE VITAMINS-MINERALS (PRESERVISION AREDS) CAPS    Take 1 capsule by mouth in the morning and at bedtime.   OLMESARTAN (BENICAR) 20 MG TABLET    Take 1 tablet by mouth once daily  SYRINGE-NEEDLE, DISP, 3 ML (BD SAFETYGLIDE SYRINGE/NEEDLE) 25G X 1" 3 ML MISC    Inject 67m in deltoid once monthly   VENLAFAXINE XR (EFFEXOR-XR) 150 MG 24 HR CAPSULE    Take 1 capsule (150 mg total) by mouth daily with breakfast.   WHEAT DEXTRIN (BENEFIBER PO)    Take by mouth. Take one teaspoon daily  Modified Medications   No medications on file  Discontinued Medications   No medications on file    Physical Exam:  Vitals:   04/27/22 0839  BP: 110/70  Pulse: 83  Temp: (!) 96.8 F (36 C)  SpO2: 98%  Weight: 143 lb 9.6 oz (65.1 kg)  Height: '5\' 2"'$  (1.575 m)   Body mass index is 26.26 kg/m. Wt Readings from Last 3 Encounters:  04/27/22 143 lb 9.6 oz (65.1 kg)  03/16/22 145 lb (65.8 kg)  10/20/21 150 lb 6.4 oz (68.2 kg)    Physical Exam Constitutional:      General: She is not in acute distress.    Appearance: She is well-developed. She is not diaphoretic.  HENT:     Head: Normocephalic and atraumatic.     Mouth/Throat:     Pharynx: No oropharyngeal exudate.  Eyes:     Conjunctiva/sclera: Conjunctivae normal.     Pupils: Pupils are equal, round, and reactive to light.  Cardiovascular:     Rate and Rhythm: Normal rate and regular rhythm.     Heart sounds: Normal heart sounds.  Pulmonary:     Effort: Pulmonary effort is normal.     Breath sounds: Normal breath sounds.  Abdominal:     General: Bowel sounds are normal.     Palpations: Abdomen is soft.  Musculoskeletal:     Cervical back: Normal range of motion and neck supple. No rigidity or torticollis. No pain with movement, spinous process tenderness or muscular tenderness.      Right lower leg: No edema.     Left lower leg: No edema.  Skin:    General: Skin is warm and dry.  Neurological:     Mental Status: She is alert.  Psychiatric:        Mood and Affect: Mood normal.     Labs reviewed: Basic Metabolic Panel: Recent Labs    10/18/21 0920  NA 135  K 5.0  CL 98  CO2 27  GLUCOSE 92  BUN 37*  CREATININE 0.65  CALCIUM 9.8  TSH 0.27*   Liver Function Tests: Recent Labs    10/18/21 0920  AST 21  ALT 18  BILITOT 0.4  PROT 7.3   No results for input(s): "LIPASE", "AMYLASE" in the last 8760 hours. No results for input(s): "AMMONIA" in the last 8760 hours. CBC: Recent Labs    10/18/21 0920  WBC 8.5  NEUTROABS 5,211  HGB 13.2  HCT 39.3  MCV 87.1  PLT 313   Lipid Panel: No results for input(s): "CHOL", "HDL", "LDLCALC", "TRIG", "CHOLHDL", "LDLDIRECT" in the last 8760 hours. TSH: Recent Labs    10/18/21 0920  TSH 0.27*   A1C: No results found for: "HGBA1C"   Assessment/Plan 1. Pernicious anemia -continues on b12 injections, follow up lab today - CBC with Differential/Platelet  2. Hypothyroidism due to Hashimoto's thyroiditis -decrease in synthroid 6 months ago, will recheck - TSH  3. Essential hypertension -Blood pressure well controlled, goal bp <140/90 Continue current medications and dietary modifications follow metabolic panel - COMPLETE METABOLIC PANEL WITH GFR - CBC with Differential/Platelet  4. Post-nasal drip -  ongoing, continues on claritin.  - ipratropium (ATROVENT) 0.03 % nasal spray; Place 2 sprays into both nostrils every 12 (twelve) hours.  Dispense: 30 mL; Refill: 3  5. Age-related osteoporosis without current pathological fracture -continues on fosamax, with cal, vit d and weight bearing exericses  6. Seasonal affective disorder ([place]) -doing very well at this time, continues on medication with routine follow up to therapist.   7. Neck pain, acute Ongoing, slowing improving, will have her use  warm compresses, she will get exercises and notify if worsens or fails to improve Return in about 6 months (around 10/26/2022) for routine follow up .  Carlos American. [place], [facility] [phone]

## 2022-04-28 ENCOUNTER — Other Ambulatory Visit: Payer: Self-pay

## 2022-04-28 DIAGNOSIS — E162 Hypoglycemia, unspecified: Secondary | ICD-10-CM

## 2022-04-28 LAB — COMPLETE METABOLIC PANEL WITH GFR
AG Ratio: 1.6 (calc) (ref 1.0–2.5)
ALT: 12 U/L (ref 6–29)
AST: 17 U/L (ref 10–35)
Albumin: 4.3 g/dL (ref 3.6–5.1)
Alkaline phosphatase (APISO): 60 U/L (ref 37–153)
BUN/Creatinine Ratio: 32 (calc) — ABNORMAL HIGH (ref 6–22)
BUN: 26 mg/dL — ABNORMAL HIGH (ref 7–25)
CO2: 26 mmol/L (ref 20–32)
Calcium: 9.8 mg/dL (ref 8.6–10.4)
Chloride: 103 mmol/L (ref 98–110)
Creat: 0.82 mg/dL (ref 0.60–1.00)
Globulin: 2.7 g/dL (calc) (ref 1.9–3.7)
Glucose, Bld: 53 mg/dL — ABNORMAL LOW (ref 65–139)
Potassium: 4.6 mmol/L (ref 3.5–5.3)
Sodium: 139 mmol/L (ref 135–146)
Total Bilirubin: 0.3 mg/dL (ref 0.2–1.2)
Total Protein: 7 g/dL (ref 6.1–8.1)
eGFR: 73 mL/min/{1.73_m2} (ref >=60)

## 2022-04-28 LAB — CBC WITH DIFFERENTIAL/PLATELET
Absolute Monocytes: 484 cells/uL (ref 200–950)
Basophils Absolute: 99 cells/uL (ref 0–200)
Basophils Relative: 1.8 %
Eosinophils Absolute: 330 cells/uL (ref 15–500)
Eosinophils Relative: 6 %
HCT: 38 % (ref 35.0–45.0)
Hemoglobin: 12.8 g/dL (ref 11.7–15.5)
Lymphs Abs: 2145 cells/uL (ref 850–3900)
MCH: 30 pg (ref 27.0–33.0)
MCHC: 33.7 g/dL (ref 32.0–36.0)
MCV: 89.2 fL (ref 80.0–100.0)
MPV: 10.4 fL (ref 7.5–12.5)
Monocytes Relative: 8.8 %
Neutro Abs: 2442 cells/uL (ref 1500–7800)
Neutrophils Relative %: 44.4 %
Platelets: 283 10*3/uL (ref 140–400)
RBC: 4.26 10*6/uL (ref 3.80–5.10)
RDW: 12.7 % (ref 11.0–15.0)
Total Lymphocyte: 39 %
WBC: 5.5 10*3/uL (ref 3.8–10.8)

## 2022-04-28 LAB — TSH: TSH: 1.03 mIU/L (ref 0.40–4.50)

## 2022-05-04 ENCOUNTER — Other Ambulatory Visit: Payer: Medicare Other

## 2022-05-04 DIAGNOSIS — E162 Hypoglycemia, unspecified: Secondary | ICD-10-CM

## 2022-05-05 LAB — BASIC METABOLIC PANEL WITH GFR
BUN/Creatinine Ratio: 40 (calc) — ABNORMAL HIGH (ref 6–22)
BUN: 29 mg/dL — ABNORMAL HIGH (ref 7–25)
CO2: 28 mmol/L (ref 20–32)
Calcium: 9.8 mg/dL (ref 8.6–10.4)
Chloride: 101 mmol/L (ref 98–110)
Creat: 0.72 mg/dL (ref 0.60–1.00)
Glucose, Bld: 90 mg/dL (ref 65–99)
Potassium: 4.9 mmol/L (ref 3.5–5.3)
Sodium: 137 mmol/L (ref 135–146)
eGFR: 86 mL/min/{1.73_m2} (ref >=60)

## 2022-05-05 NOTE — Progress Notes (Signed)
-    BMP done yesterday is better than previous. No new order.

## 2022-05-18 ENCOUNTER — Other Ambulatory Visit: Payer: Self-pay | Admitting: Orthopedic Surgery

## 2022-05-18 DIAGNOSIS — I10 Essential (primary) hypertension: Secondary | ICD-10-CM

## 2022-05-18 DIAGNOSIS — F3175 Bipolar disorder, in partial remission, most recent episode depressed: Secondary | ICD-10-CM | POA: Diagnosis not present

## 2022-05-25 ENCOUNTER — Encounter: Payer: Self-pay | Admitting: Radiology

## 2022-06-06 ENCOUNTER — Other Ambulatory Visit (HOSPITAL_BASED_OUTPATIENT_CLINIC_OR_DEPARTMENT_OTHER): Payer: Self-pay

## 2022-06-06 DIAGNOSIS — Z23 Encounter for immunization: Secondary | ICD-10-CM | POA: Diagnosis not present

## 2022-06-06 MED ORDER — COMIRNATY 30 MCG/0.3ML IM SUSY
PREFILLED_SYRINGE | INTRAMUSCULAR | 0 refills | Status: DC
  Filled 2022-06-06: qty 0.3, 1d supply, fill #0

## 2022-07-06 ENCOUNTER — Encounter: Payer: Medicare Other | Admitting: Orthopedic Surgery

## 2022-07-06 ENCOUNTER — Encounter: Payer: Self-pay | Admitting: Orthopedic Surgery

## 2022-07-06 ENCOUNTER — Other Ambulatory Visit: Payer: Self-pay

## 2022-07-06 VITALS — Wt 137.0 lb

## 2022-07-06 DIAGNOSIS — Z0000 Encounter for general adult medical examination without abnormal findings: Secondary | ICD-10-CM

## 2022-07-06 NOTE — Progress Notes (Signed)
This encounter was created in error - please disregard.

## 2022-07-13 ENCOUNTER — Encounter: Payer: Medicare Other | Admitting: Orthopedic Surgery

## 2022-08-21 ENCOUNTER — Other Ambulatory Visit: Payer: Self-pay | Admitting: Nurse Practitioner

## 2022-08-21 DIAGNOSIS — I10 Essential (primary) hypertension: Secondary | ICD-10-CM

## 2022-09-15 ENCOUNTER — Telehealth: Payer: Medicare Other

## 2022-09-15 NOTE — Telephone Encounter (Signed)
Patient dropped form off to be completed for bus services. Form has been placed in review and sign folder on desk of Hazle Nordmann, NP to complete and sign.  Message sent to Hazle Nordmann, NP

## 2022-09-19 ENCOUNTER — Other Ambulatory Visit: Payer: Self-pay | Admitting: Orthopedic Surgery

## 2022-09-19 DIAGNOSIS — I10 Essential (primary) hypertension: Secondary | ICD-10-CM

## 2022-10-19 ENCOUNTER — Other Ambulatory Visit: Payer: Self-pay | Admitting: Orthopedic Surgery

## 2022-10-19 DIAGNOSIS — E038 Other specified hypothyroidism: Secondary | ICD-10-CM

## 2022-10-21 IMAGING — RF DG HIP (WITH PELVIS) OPERATIVE*R*
1 series · 3 of 3 positions shown · non-contrast
Comparison: 09/09/2020

CLINICAL DATA: Hip arthroplasty

EXAM:
OPERATIVE right HIP (WITH PELVIS IF PERFORMED) 3 VIEWS
TECHNIQUE: Fluoroscopic spot image(s) were submitted for interpretation
post-operatively.

[Series 1: unknown protocol · 0.20mm/px · 3 of 3 slices shown]
[im 1/3]
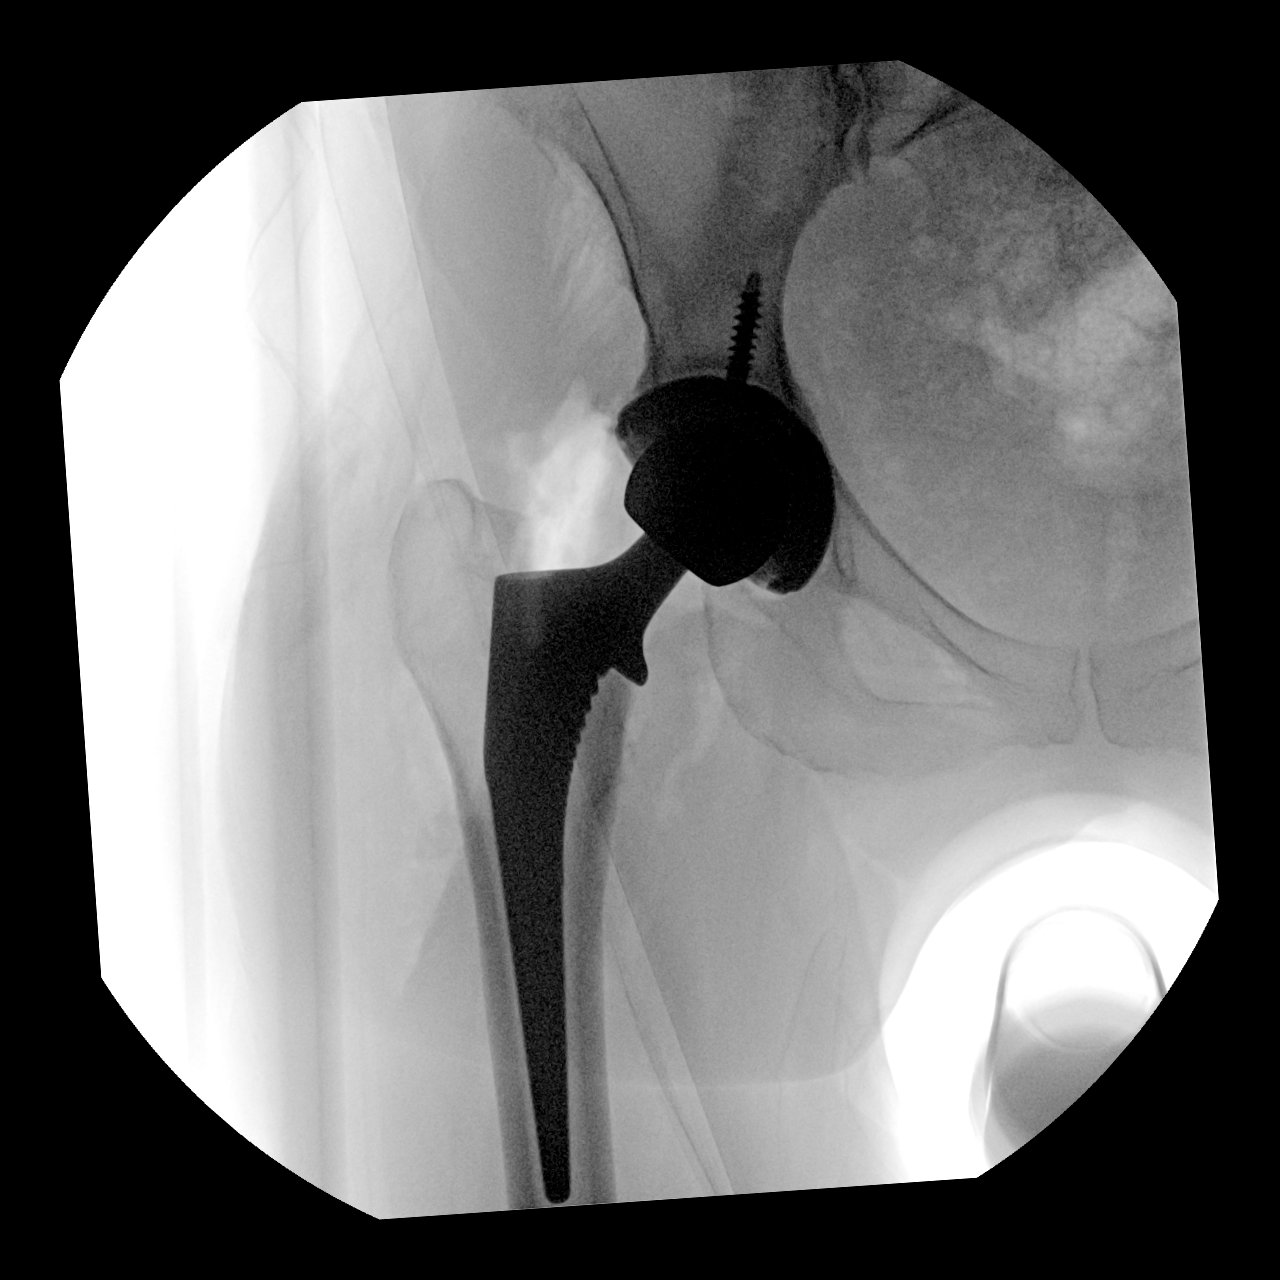
[im 2/3]
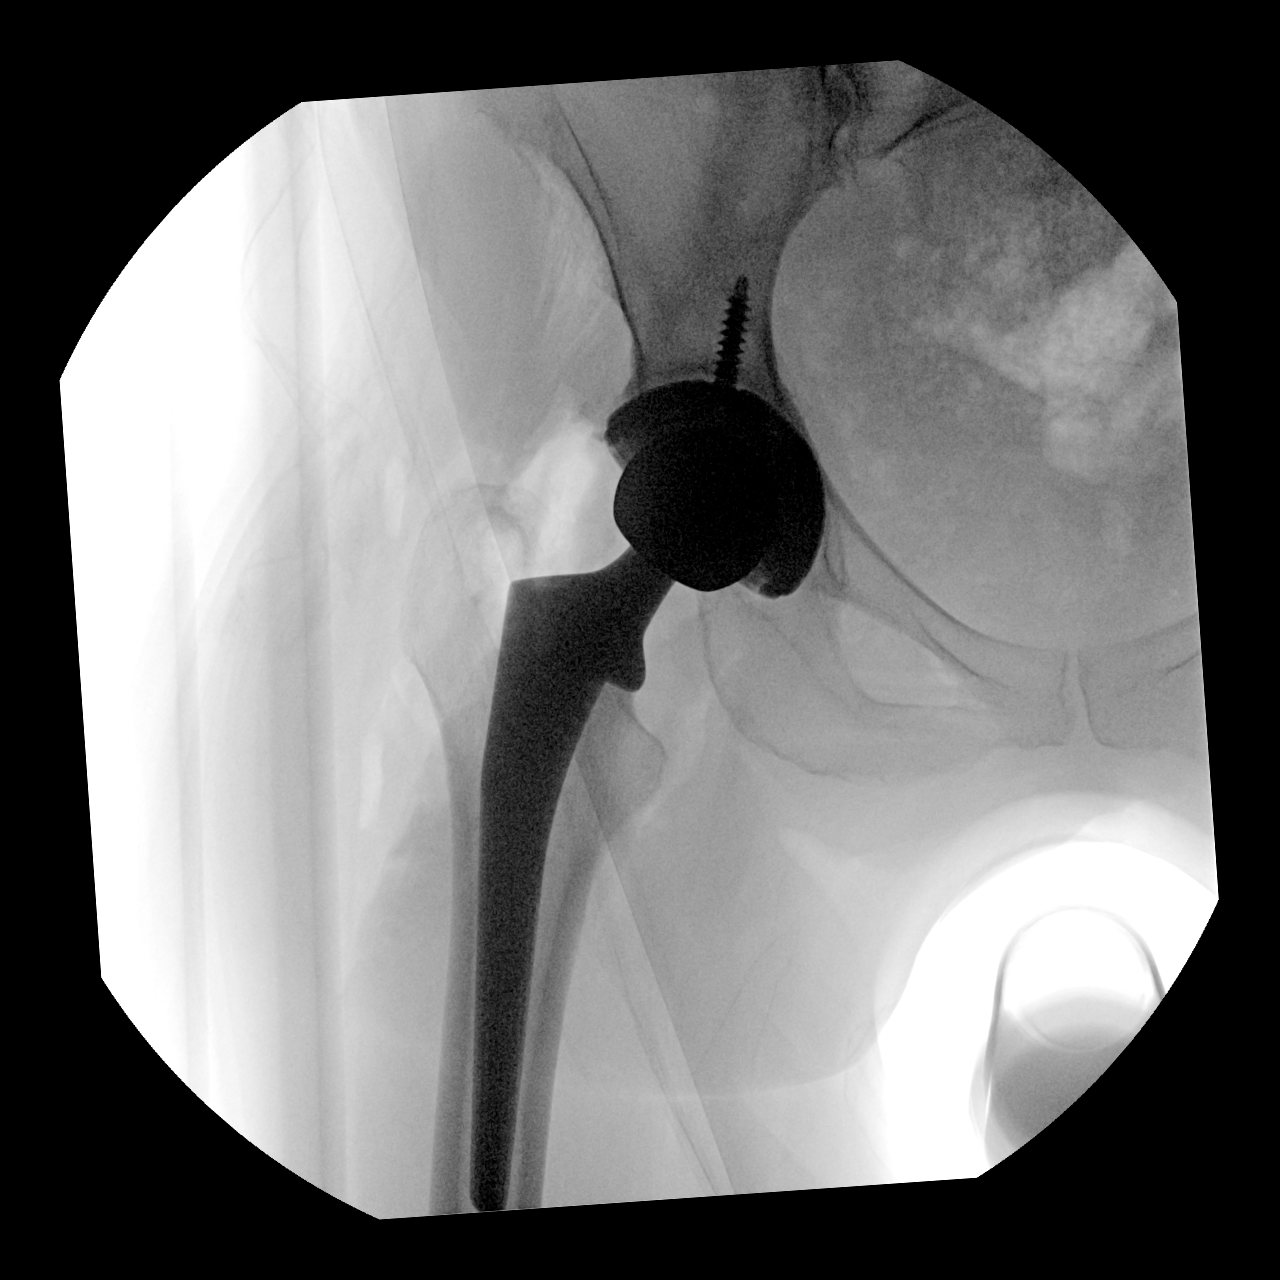
[im 3/3]
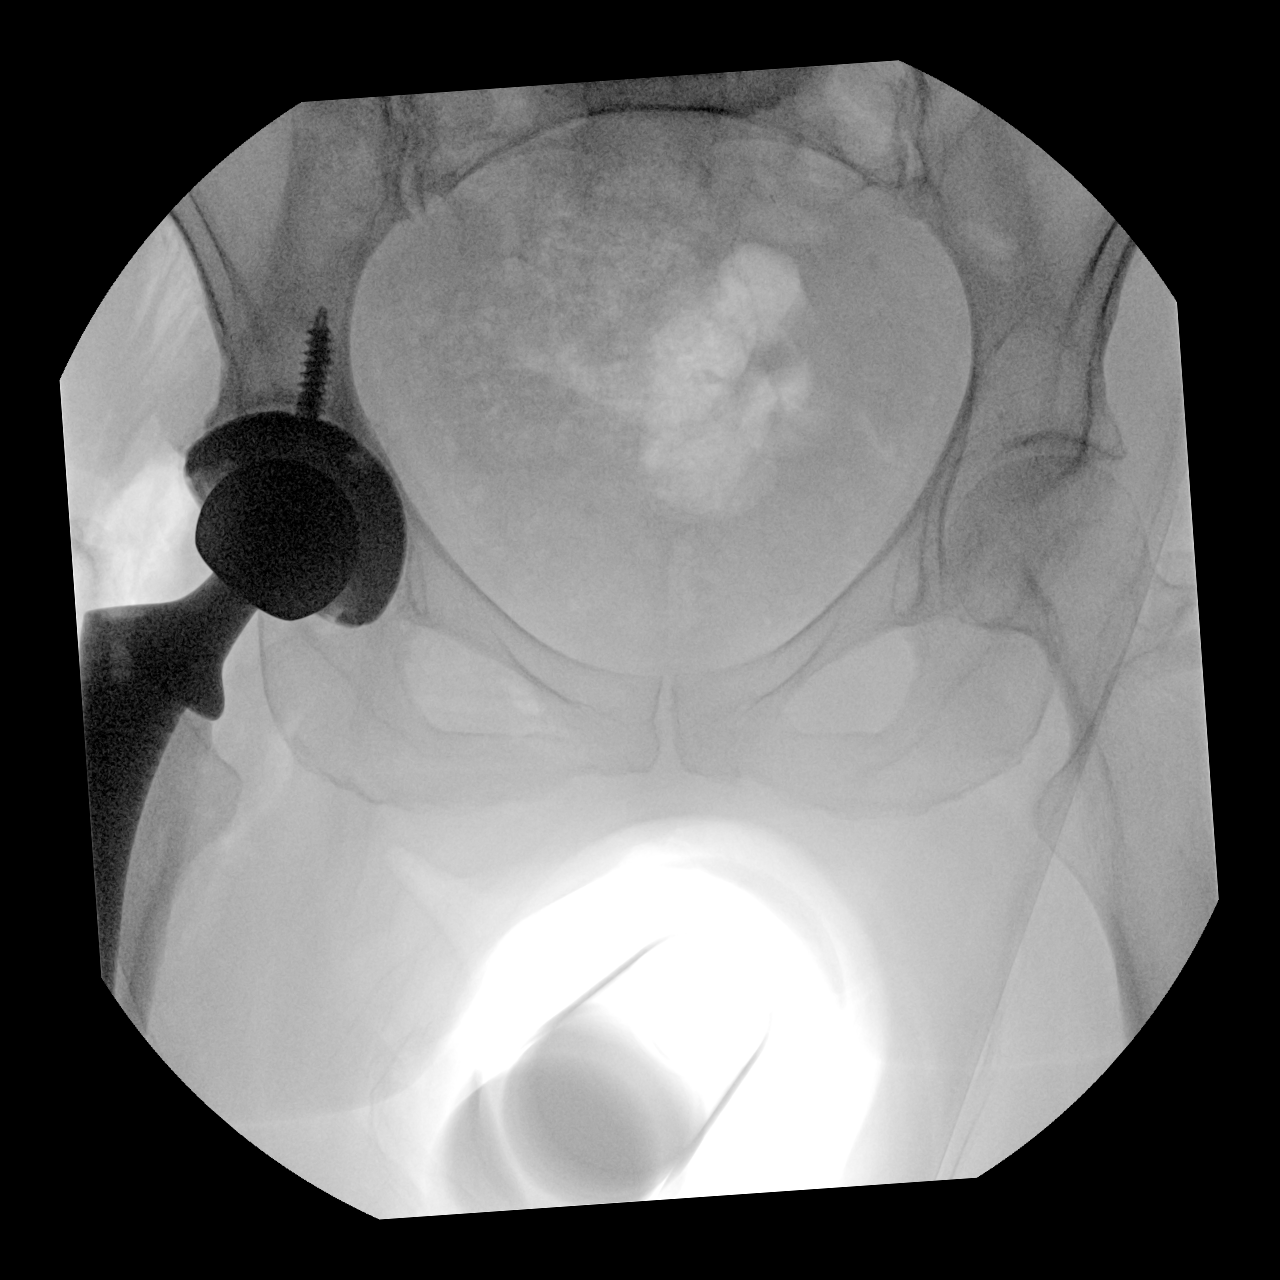

[3 of 3 positions shown; findings below may reference images not displayed]

FINDINGS: Three low resolution intraoperative spot views of the right hip.
Total fluoroscopy time was 1 minutes 4 seconds. The images
demonstrate a right hip replacement.
IMPRESSION: Intraoperative fluoroscopic assistance provided during right hip
replacement

## 2022-10-21 IMAGING — DX DG PORTABLE PELVIS
1 series · 1 of 1 positions shown · non-contrast
Comparison: 09/09/2020

CLINICAL DATA: Postop

EXAM:
PORTABLE PELVIS 1-2 VIEWS

[pelvis ap]
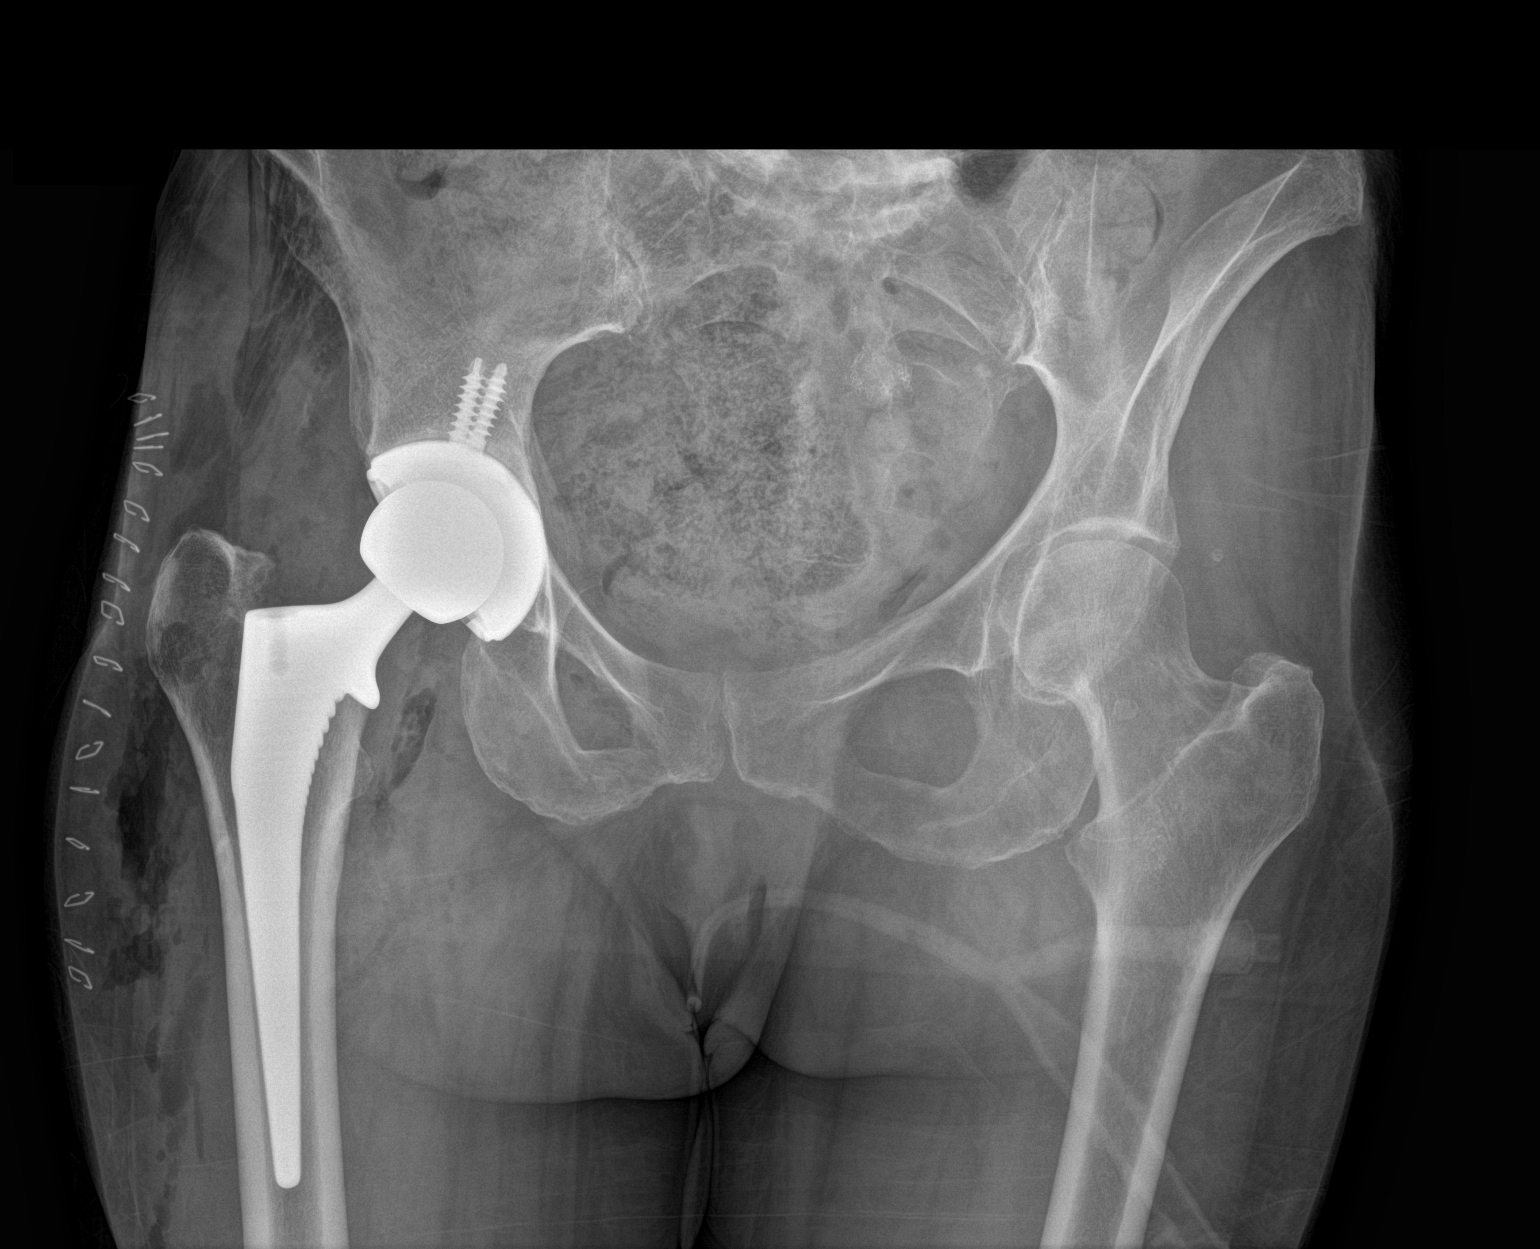

[1 of 1 positions shown; findings below may reference images not displayed]

FINDINGS: Interval right hip replacement with intact hardware and normal
alignment. Gas in the right hip and thigh soft tissues consistent
with recent surgery. Pubic symphysis and rami are intact.
Postsurgical changes in the pelvis
IMPRESSION: Right hip replacement with expected postsurgical change

## 2022-10-26 ENCOUNTER — Ambulatory Visit (INDEPENDENT_AMBULATORY_CARE_PROVIDER_SITE_OTHER): Payer: Medicare Other | Admitting: Orthopedic Surgery

## 2022-10-26 ENCOUNTER — Encounter: Payer: Self-pay | Admitting: Orthopedic Surgery

## 2022-10-26 VITALS — BP 112/64 | HR 89 | Temp 97.5°F | Resp 16 | Ht 62.0 in | Wt 138.2 lb

## 2022-10-26 DIAGNOSIS — I10 Essential (primary) hypertension: Secondary | ICD-10-CM | POA: Diagnosis not present

## 2022-10-26 DIAGNOSIS — E063 Autoimmune thyroiditis: Secondary | ICD-10-CM | POA: Diagnosis not present

## 2022-10-26 DIAGNOSIS — D510 Vitamin B12 deficiency anemia due to intrinsic factor deficiency: Secondary | ICD-10-CM | POA: Diagnosis not present

## 2022-10-26 DIAGNOSIS — M810 Age-related osteoporosis without current pathological fracture: Secondary | ICD-10-CM | POA: Diagnosis not present

## 2022-10-26 DIAGNOSIS — F319 Bipolar disorder, unspecified: Secondary | ICD-10-CM

## 2022-10-26 DIAGNOSIS — E038 Other specified hypothyroidism: Secondary | ICD-10-CM

## 2022-10-26 LAB — CBC WITH DIFFERENTIAL/PLATELET
Absolute Monocytes: 774 cells/uL (ref 200–950)
Basophils Absolute: 68 cells/uL (ref 0–200)
Basophils Relative: 0.8 %
Eosinophils Absolute: 264 cells/uL (ref 15–500)
Eosinophils Relative: 3.1 %
HCT: 33.1 % — ABNORMAL LOW (ref 35.0–45.0)
Hemoglobin: 10.7 g/dL — ABNORMAL LOW (ref 11.7–15.5)
Lymphs Abs: 2168 cells/uL (ref 850–3900)
MCH: 26.9 pg — ABNORMAL LOW (ref 27.0–33.0)
MCHC: 32.3 g/dL (ref 32.0–36.0)
MCV: 83.2 fL (ref 80.0–100.0)
MPV: 9.7 fL (ref 7.5–12.5)
Monocytes Relative: 9.1 %
Neutro Abs: 5228 cells/uL (ref 1500–7800)
Neutrophils Relative %: 61.5 %
Platelets: 393 10*3/uL (ref 140–400)
RBC: 3.98 10*6/uL (ref 3.80–5.10)
RDW: 13.8 % (ref 11.0–15.0)
Total Lymphocyte: 25.5 %
WBC: 8.5 10*3/uL (ref 3.8–10.8)

## 2022-10-26 MED ORDER — CYANOCOBALAMIN 1000 MCG/ML IJ SOLN
INTRAMUSCULAR | 2 refills | Status: DC
Start: 2022-10-26 — End: 2023-05-07

## 2022-10-26 NOTE — Patient Instructions (Addendum)
Please get flu vaccine/ covid booster BEFORE November 1st> please call [facility] and report vaccinations when you get them

## 2022-10-26 NOTE — Progress Notes (Signed)
Careteam: Patient Care Team: Octavia Heir, NP as PCP - General (Adult Health Nurse Practitioner)  Seen by: Hazle Nordmann, AGNP-C  PLACE OF SERVICE:  [facility]  Advanced Directive information    Allergies  Allergen Reactions   Contrast Media [Iodinated Contrast Media] Itching   Trintellix [Vortioxetine] Other (See Comments)    Led to mania   Dairycare [Lactase-Lactobacillus]    Doxycycline Hyclate Nausea And Vomiting    Double vision   Soy Allergy Itching   Ciprofloxacin Rash   Levaquin [Levofloxacin] Rash   Penicillins Rash    Chief Complaint  Patient presents with   Medical Management of Chronic Issues    6 month follow up.    Immunizations    Discuss the need for Covid Booster, and Influenza vaccine. [facility] Verified.      HPI: Patient is a 77 y.o. female seen today for medical management of chronic conditions.   No health concerns today.   Ambulating with cane. No falls or injuries.   Denies mood changes. Followed by psych for bipolar disorder. She continues to take Effexor and Depakote as prescribed.  HTN- controlled with amlodipine and olmesartan  H/o Hashimoto's- remains on levothyroxine, she missed a few pills within past week  Osteoporosis- remains on Fosamax  Anemia- remains on B12 monhtly injections  Plans to get flu/covid booster at [facility] when offered.     Review of Systems:  Review of Systems  Constitutional: Negative.   HENT: Negative.    Eyes: Negative.   Respiratory:  Negative for cough, shortness of breath and wheezing.   Cardiovascular:  Negative for chest pain and leg swelling.  Gastrointestinal: Negative.   Genitourinary: Negative.   Musculoskeletal:  Negative for falls and joint pain.  Skin: Negative.   Neurological: Negative.   Psychiatric/Behavioral:  Positive for depression. The patient is nervous/anxious.     Past Medical History:  Diagnosis Date   Allergy    Anxiety 2000   Arthritis    Bipolar 1 disorder, depressed  (HCC)    Bipolar disorder (HCC)    Chicken pox    Dementia (HCC)    Depression    Dysrhythmia 05/10/2020   pt states "cardiologist told me it is not dangerous, it's an anomaly"   Hashimoto's thyroiditis    History of blood transfusion    History of bone density study 2019   History of colonoscopy 2015   History of CT scan 2019   History of mammogram 2019   History of MRI    History of Papanicolaou smear of cervix    History of right hip replacement    Hx: UTI (urinary tract infection)    Hypertension    Hypothyroidism    Lactose intolerance    Legally blind in left eye, as defined in [place]    Non-celiac gluten sensitivity    Osteoporosis    Pernicious anemia    Pernicious anemia    Rheumatic fever    Thyroid disease    Urinary and fecal incontinence    Past Surgical History:  Procedure Laterality Date   ABDOMINAL HYSTERECTOMY     1981   APPENDECTOMY  1981   EYE SURGERY Bilateral 2014   cataracts   LEFT HEART CATH AND CORONARY ANGIOGRAPHY N/A 05/10/2020   Procedure: LEFT HEART CATH AND CORONARY ANGIOGRAPHY;  Surgeon: Yvonne Kendall, MD;  Location: [facility] INVASIVE CV LAB;  Service: Cardiovascular;  Laterality: N/A;   prolapsed rectum 2015     TONSILLECTOMY AND ADENOIDECTOMY  1966   TOTAL HIP ARTHROPLASTY Right 11/23/2020   Procedure: RIGHT TOTAL HIP ARTHROPLASTY ANTERIOR APPROACH;  Surgeon: Kathryne Hitch, MD;  Location: [facility] OR;  Service: Orthopedics;  Laterality: Right;   TUBAL LIGATION     Social History:   reports that she has never smoked. She has never used smokeless tobacco. She reports that she does not drink alcohol and does not use drugs.  Family History  Problem Relation Age of Onset   Heart disease Mother    Arthritis Mother    Cancer Father        bladder   Thyroid disease Father    Dementia Father    Hypertension Sister    Hypertension Brother    Hypertension Sister    Hypertension Sister    Diabetes Brother    Diabetes type II Son      Medications: Patient's Medications  New Prescriptions   No medications on file  Previous Medications   ACETAMINOPHEN (TYLENOL) 500 MG TABLET    Take 1,000 mg by mouth at bedtime.   ALENDRONATE (FOSAMAX) 70 MG TABLET    TAKE 1 TABLET(70 MG) BY MOUTH 1 TIME A WEEK WITH A FULL GLASS OF WATER AND ON AN EMPTY STOMACH   AMLODIPINE (NORVASC) 10 MG TABLET    Take 1 tablet by mouth once daily   ASPIRIN EC 81 MG TABLET    Take 81 mg by mouth daily. Swallow whole.   CALCIUM-VITAMIN D-VITAMIN K (VIACTIV CALCIUM PLUS D) 650-12.5-40 MG-MCG-MCG CHEW    Chew 2 tablets by mouth daily.   COVID-19 MRNA VACCINE 2023-2024 (COMIRNATY) SYRINGE    Inject into the muscle.   CYANOCOBALAMIN (VITAMIN B12) 1000 MCG/ML INJECTION    INJECT 1 ML INTRAMUSCULARLY  ONCE EVERY MONTH   DIVALPROEX (DEPAKOTE ER) 500 MG 24 HR TABLET    Take 1 tablet (500 mg total) by mouth at bedtime.   EMOLLIENT (AVEENO RESTORATIVE SKIN THERAP) CREA    Apply 1 application topically daily.   HALOBETASOL (ULTRAVATE) 0.05 % OINTMENT    Apply 1 application topically daily as needed (eczema).   IPRATROPIUM (ATROVENT) 0.03 % NASAL SPRAY    Place 2 sprays into both nostrils every 12 (twelve) hours.   LEVOTHYROXINE (SYNTHROID) 100 MCG TABLET    Take 1 tablet by mouth once daily   LORATADINE (CLARITIN) 10 MG TABLET    Take 10 mg by mouth daily.   MECLIZINE (ANTIVERT) 50 MG TABLET    Take 1 tablet (50 mg total) by mouth every 6 (six) hours as needed.   MULTIPLE VITAMINS-MINERALS (CENTRUM SILVER ADULT 50+ PO)    Take 1 tablet by mouth daily.   MULTIPLE VITAMINS-MINERALS (PRESERVISION AREDS) CAPS    Take 1 capsule by mouth in the morning and at bedtime.   OLMESARTAN (BENICAR) 20 MG TABLET    Take 1 tablet by mouth once daily   SYRINGE-NEEDLE, DISP, 3 ML (BD SAFETYGLIDE SYRINGE/NEEDLE) 25G X 1" 3 ML MISC    Inject 1ml in deltoid once monthly   VENLAFAXINE XR (EFFEXOR-XR) 150 MG 24 HR CAPSULE    Take 1 capsule (150 mg total) by mouth daily with breakfast.    WHEAT DEXTRIN (BENEFIBER PO)    Take by mouth. Take one teaspoon daily  Modified Medications   No medications on file  Discontinued Medications   No medications on file    Physical Exam:  Vitals:   10/26/22 0817  BP: 112/64  Pulse: 89  Resp: 16  Temp: (!) 97.5  F (36.4 C)  SpO2: 97%  Weight: 138 lb 3.2 oz (62.7 kg)  Height: 5\' 2"  (1.575 m)   Body mass index is 25.28 kg/m. Wt Readings from Last 3 Encounters:  10/26/22 138 lb 3.2 oz (62.7 kg)  07/06/22 137 lb (62.1 kg)  04/27/22 143 lb 9.6 oz (65.1 kg)    Physical Exam Vitals reviewed.  Constitutional:      General: She is not in acute distress. HENT:     Head: Normocephalic.  Eyes:     General:        Right eye: No discharge.        Left eye: No discharge.  Cardiovascular:     Rate and Rhythm: Normal rate and regular rhythm.     Pulses: Normal pulses.     Heart sounds: Normal heart sounds.  Pulmonary:     Effort: Pulmonary effort is normal. No respiratory distress.     Breath sounds: Normal breath sounds. No wheezing or rales.  Abdominal:     General: Bowel sounds are normal.     Palpations: Abdomen is soft.  Musculoskeletal:     Cervical back: Neck supple.     Right lower leg: No edema.     Left lower leg: No edema.  Skin:    General: Skin is warm.     Capillary Refill: Capillary refill takes less than 2 seconds.  Neurological:     General: No focal deficit present.     Mental Status: She is alert and oriented to person, place, and time.     Gait: Gait abnormal.     Comments: cane  Psychiatric:        Mood and Affect: Mood normal.     Labs reviewed: Basic Metabolic Panel: Recent Labs    04/27/22 0903 05/04/22 0837  NA 139 137  K 4.6 4.9  CL 103 101  CO2 26 28  GLUCOSE 53* 90  BUN 26* 29*  CREATININE 0.82 0.72  CALCIUM 9.8 9.8  TSH 1.03  --    Liver Function Tests: Recent Labs    04/27/22 0903  AST 17  ALT 12  BILITOT 0.3  PROT 7.0   No results for input(s): "LIPASE",  "AMYLASE" in the last 8760 hours. No results for input(s): "AMMONIA" in the last 8760 hours. CBC: Recent Labs    04/27/22 0903  WBC 5.5  NEUTROABS 2,442  HGB 12.8  HCT 38.0  MCV 89.2  PLT 283   Lipid Panel: No results for input(s): "CHOL", "HDL", "LDLCALC", "TRIG", "CHOLHDL", "LDLDIRECT" in the last 8760 hours. TSH: Recent Labs    04/27/22 0903  TSH 1.03   A1C: No results found for: "HGBA1C"   Assessment/Plan 1. Primary hypertension - controlled, goal < 150/90 - cont amlodipine and olmesartan - Basic Metabolic Panel with eGFR  2. Hypothyroidism due to Hashimoto's thyroiditis - asymptomatic - cont levothyroxine - TSH  3. Bipolar 1 disorder, depressed (HCC) - followed by psych - no changes in mood - cont Depakote and Effexor - Valproic Acid Level  4. Pernicious anemia - asymptomatic - cyanocobalamin (VITAMIN B12) 1000 MCG/ML injection; INJECT 1 ML INTRAMUSCULARLY  ONCE EVERY MONTH  Dispense: 3 mL; Refill: 2 - CBC with Differential/Platelet  5. Age-related osteoporosis without current pathological fracture - DEXA 2023, t score -2.5 - cont fosamax, calcium and vitamin D - cont weight bearing exercise  Total time: 34 minutes. Greater than 50% of total time spent doing patient education regarding health maintenance, HTN, hypothyroidism, anemia,  and bipolar disorder including symptom/medication management.     Next appt: Visit date not found  Tanzie Rothschild Scherry Ran  [facility] [phone]

## 2022-10-27 ENCOUNTER — Other Ambulatory Visit: Payer: Self-pay | Admitting: Orthopedic Surgery

## 2022-10-27 DIAGNOSIS — E875 Hyperkalemia: Secondary | ICD-10-CM

## 2022-10-27 DIAGNOSIS — E038 Other specified hypothyroidism: Secondary | ICD-10-CM

## 2022-10-27 MED ORDER — SODIUM POLYSTYRENE SULFONATE PO POWD: Freq: Once | ORAL | 0 refills | Status: AC

## 2022-10-27 MED ORDER — LEVOTHYROXINE SODIUM 88 MCG PO TABS
88.0000 ug | ORAL_TABLET | Freq: Every day | ORAL | 3 refills | Status: DC
Start: 2022-10-27 — End: 2024-01-17

## 2022-10-30 ENCOUNTER — Other Ambulatory Visit: Payer: Self-pay

## 2022-10-30 DIAGNOSIS — E875 Hyperkalemia: Secondary | ICD-10-CM

## 2022-11-02 ENCOUNTER — Telehealth: Payer: Self-pay

## 2022-11-02 NOTE — Telephone Encounter (Signed)
Patient called requesting form for handicap placard be completed. Form has been started and placed in review and sing folder on desk of Hazle Nordmann, NP

## 2022-11-07 NOTE — Telephone Encounter (Signed)
Form signed and placed up front for pick up.

## 2022-11-08 ENCOUNTER — Other Ambulatory Visit: Payer: Medicare Other

## 2022-11-08 DIAGNOSIS — E875 Hyperkalemia: Secondary | ICD-10-CM | POA: Diagnosis not present

## 2022-11-09 LAB — BASIC METABOLIC PANEL WITH GFR: Glucose, Bld: 82 mg/dL (ref 65–139)

## 2022-11-21 DIAGNOSIS — Z23 Encounter for immunization: Secondary | ICD-10-CM | POA: Diagnosis not present

## 2022-11-29 DIAGNOSIS — H35372 Puckering of macula, left eye: Secondary | ICD-10-CM | POA: Diagnosis not present

## 2022-11-29 DIAGNOSIS — Z961 Presence of intraocular lens: Secondary | ICD-10-CM | POA: Diagnosis not present

## 2022-11-29 DIAGNOSIS — H353132 Nonexudative age-related macular degeneration, bilateral, intermediate dry stage: Secondary | ICD-10-CM | POA: Diagnosis not present

## 2022-11-29 DIAGNOSIS — H43813 Vitreous degeneration, bilateral: Secondary | ICD-10-CM | POA: Diagnosis not present

## 2023-01-02 DIAGNOSIS — F418 Other specified anxiety disorders: Secondary | ICD-10-CM | POA: Diagnosis not present

## 2023-01-02 DIAGNOSIS — F3131 Bipolar disorder, current episode depressed, mild: Secondary | ICD-10-CM | POA: Diagnosis not present

## 2023-01-05 ENCOUNTER — Other Ambulatory Visit (HOSPITAL_BASED_OUTPATIENT_CLINIC_OR_DEPARTMENT_OTHER): Payer: Self-pay

## 2023-01-05 DIAGNOSIS — Z23 Encounter for immunization: Secondary | ICD-10-CM | POA: Diagnosis not present

## 2023-01-05 MED ORDER — FLUAD 0.5 ML IM SUSY
0.5000 mL | PREFILLED_SYRINGE | Freq: Once | INTRAMUSCULAR | 0 refills | Status: AC
  Filled 2023-01-05: qty 0.5, 1d supply, fill #0

## 2023-01-26 DIAGNOSIS — F319 Bipolar disorder, unspecified: Secondary | ICD-10-CM | POA: Diagnosis not present

## 2023-01-26 DIAGNOSIS — F32A Depression, unspecified: Secondary | ICD-10-CM | POA: Diagnosis not present

## 2023-02-12 DIAGNOSIS — L720 Epidermal cyst: Secondary | ICD-10-CM | POA: Diagnosis not present

## 2023-03-21 ENCOUNTER — Other Ambulatory Visit: Payer: Self-pay | Admitting: Orthopedic Surgery

## 2023-03-21 DIAGNOSIS — I10 Essential (primary) hypertension: Secondary | ICD-10-CM

## 2023-04-18 ENCOUNTER — Other Ambulatory Visit: Payer: Self-pay | Admitting: Orthopedic Surgery

## 2023-04-18 DIAGNOSIS — E063 Autoimmune thyroiditis: Secondary | ICD-10-CM

## 2023-04-19 ENCOUNTER — Other Ambulatory Visit: Payer: Self-pay | Admitting: Orthopedic Surgery

## 2023-04-19 ENCOUNTER — Telehealth: Payer: Self-pay

## 2023-04-19 DIAGNOSIS — I10 Essential (primary) hypertension: Secondary | ICD-10-CM

## 2023-04-19 DIAGNOSIS — F319 Bipolar disorder, unspecified: Secondary | ICD-10-CM

## 2023-04-19 DIAGNOSIS — E063 Autoimmune thyroiditis: Secondary | ICD-10-CM

## 2023-04-19 NOTE — Telephone Encounter (Signed)
Patient states she has been having a frequent nose bleed, and when she stands up she has been feeling faint. She also wants to know can she move her lab appointment to tomorrow if it is appropriate to rule out why she might have been feeling faint.She has an appt scheduled 05/10/2023. Medication refill requested for Levothyroxine but her active medication list it states and was aware and states she like the better.

## 2023-04-19 NOTE — Telephone Encounter (Signed)
Nosebleeds are common this time of year due to lack of humidity in winter months. Recommend using humidifier at night. Recommend moistening nostrils with Vaseline a few times daily. Lab orders placed. Last TSH was indicating hyperthyroidism ( too high). I reduced levothyroxine to 88 mcg to help get you back into therapeutic range. Lets do labs first and see if we have to make any medication adjustments before refilling mediation.

## 2023-04-20 ENCOUNTER — Other Ambulatory Visit: Payer: Self-pay | Admitting: Orthopedic Surgery

## 2023-04-20 DIAGNOSIS — E063 Autoimmune thyroiditis: Secondary | ICD-10-CM

## 2023-04-20 NOTE — Telephone Encounter (Signed)
Called and discuss with the patient

## 2023-04-25 ENCOUNTER — Other Ambulatory Visit: Payer: Medicare Other

## 2023-04-25 DIAGNOSIS — I10 Essential (primary) hypertension: Secondary | ICD-10-CM

## 2023-04-25 DIAGNOSIS — E063 Autoimmune thyroiditis: Secondary | ICD-10-CM

## 2023-04-25 DIAGNOSIS — F319 Bipolar disorder, unspecified: Secondary | ICD-10-CM

## 2023-04-26 ENCOUNTER — Encounter: Payer: Self-pay | Admitting: Orthopedic Surgery

## 2023-04-26 ENCOUNTER — Other Ambulatory Visit: Payer: Self-pay | Admitting: Orthopedic Surgery

## 2023-04-26 DIAGNOSIS — E875 Hyperkalemia: Secondary | ICD-10-CM

## 2023-04-26 LAB — CBC WITH DIFFERENTIAL/PLATELET
Absolute Lymphocytes: 2485 {cells}/uL (ref 850–3900)
Absolute Monocytes: 525 {cells}/uL (ref 200–950)
Basophils Absolute: 99 {cells}/uL (ref 0–200)
Basophils Relative: 1.4 %
Eosinophils Absolute: 291 {cells}/uL (ref 15–500)
Eosinophils Relative: 4.1 %
HCT: 35.6 % (ref 35.0–45.0)
Hemoglobin: 11.8 g/dL (ref 11.7–15.5)
MCH: 29 pg (ref 27.0–33.0)
MCHC: 33.1 g/dL (ref 32.0–36.0)
MCV: 87.5 fL (ref 80.0–100.0)
MPV: 9.9 fL (ref 7.5–12.5)
Monocytes Relative: 7.4 %
Neutro Abs: 3699 {cells}/uL (ref 1500–7800)
Neutrophils Relative %: 52.1 %
Platelets: 337 10*3/uL (ref 140–400)
RBC: 4.07 10*6/uL (ref 3.80–5.10)
RDW: 13.3 % (ref 11.0–15.0)
Total Lymphocyte: 35 %
WBC: 7.1 10*3/uL (ref 3.8–10.8)

## 2023-04-26 LAB — COMPLETE METABOLIC PANEL WITH GFR
AG Ratio: 1.3 (calc) (ref 1.0–2.5)
ALT: 13 U/L (ref 6–29)
AST: 18 U/L (ref 10–35)
Albumin: 4.1 g/dL (ref 3.6–5.1)
Alkaline phosphatase (APISO): 59 U/L (ref 37–153)
BUN/Creatinine Ratio: 45 (calc) — ABNORMAL HIGH (ref 6–22)
BUN: 35 mg/dL — ABNORMAL HIGH (ref 7–25)
CO2: 29 mmol/L (ref 20–32)
Calcium: 10 mg/dL (ref 8.6–10.4)
Chloride: 102 mmol/L (ref 98–110)
Creat: 0.78 mg/dL (ref 0.60–1.00)
Globulin: 3.2 g/dL (ref 1.9–3.7)
Glucose, Bld: 91 mg/dL (ref 65–99)
Potassium: 5.6 mmol/L — ABNORMAL HIGH (ref 3.5–5.3)
Sodium: 139 mmol/L (ref 135–146)
Total Bilirubin: 0.4 mg/dL (ref 0.2–1.2)
Total Protein: 7.3 g/dL (ref 6.1–8.1)
eGFR: 77 mL/min/{1.73_m2} (ref >=60)

## 2023-04-26 LAB — TSH: TSH: 0.67 m[IU]/L (ref 0.40–4.50)

## 2023-04-26 MED ORDER — SODIUM POLYSTYRENE SULFONATE PO POWD: Freq: Once | ORAL | 0 refills | Status: AC

## 2023-05-04 ENCOUNTER — Other Ambulatory Visit (HOSPITAL_BASED_OUTPATIENT_CLINIC_OR_DEPARTMENT_OTHER): Payer: Self-pay

## 2023-05-04 ENCOUNTER — Other Ambulatory Visit: Payer: Medicare Other

## 2023-05-04 DIAGNOSIS — E875 Hyperkalemia: Secondary | ICD-10-CM | POA: Diagnosis not present

## 2023-05-04 DIAGNOSIS — Z23 Encounter for immunization: Secondary | ICD-10-CM | POA: Diagnosis not present

## 2023-05-04 MED ORDER — COVID-19 MRNA VAC-TRIS(PFIZER) 30 MCG/0.3ML IM SUSY
0.3000 mL | PREFILLED_SYRINGE | Freq: Once | INTRAMUSCULAR | 0 refills | Status: AC
  Filled 2023-05-04: qty 0.3, 1d supply, fill #0

## 2023-05-05 LAB — BASIC METABOLIC PANEL WITH GFR
BUN/Creatinine Ratio: 34 (calc) — ABNORMAL HIGH (ref 6–22)
BUN: 33 mg/dL — ABNORMAL HIGH (ref 7–25)
CO2: 25 mmol/L (ref 20–32)
Calcium: 9.5 mg/dL (ref 8.6–10.4)
Chloride: 103 mmol/L (ref 98–110)
Creat: 0.96 mg/dL (ref 0.60–1.00)
Glucose, Bld: 92 mg/dL (ref 65–139)
Potassium: 4.6 mmol/L (ref 3.5–5.3)
Sodium: 138 mmol/L (ref 135–146)
eGFR: 60 mL/min/{1.73_m2} (ref >=60)

## 2023-05-06 ENCOUNTER — Other Ambulatory Visit: Payer: Self-pay | Admitting: Orthopedic Surgery

## 2023-05-06 DIAGNOSIS — D510 Vitamin B12 deficiency anemia due to intrinsic factor deficiency: Secondary | ICD-10-CM

## 2023-05-07 NOTE — Telephone Encounter (Signed)
Patient has request refill on mediation Vitamin B12. Patient medication pend and sent to Ella Bodo, NP due to PCP Octavia Heir, NP being out of office.

## 2023-05-08 ENCOUNTER — Other Ambulatory Visit: Payer: Self-pay | Admitting: Orthopedic Surgery

## 2023-05-08 ENCOUNTER — Encounter: Payer: Medicare Other | Admitting: Family

## 2023-05-08 ENCOUNTER — Encounter: Payer: Self-pay | Admitting: Orthopedic Surgery

## 2023-05-08 DIAGNOSIS — E875 Hyperkalemia: Secondary | ICD-10-CM

## 2023-05-09 ENCOUNTER — Other Ambulatory Visit: Payer: Self-pay

## 2023-05-09 DIAGNOSIS — E875 Hyperkalemia: Secondary | ICD-10-CM

## 2023-05-10 ENCOUNTER — Ambulatory Visit (INDEPENDENT_AMBULATORY_CARE_PROVIDER_SITE_OTHER): Payer: Medicare Other | Admitting: Orthopedic Surgery

## 2023-05-10 ENCOUNTER — Encounter: Payer: Self-pay | Admitting: Orthopedic Surgery

## 2023-05-10 ENCOUNTER — Other Ambulatory Visit: Payer: Medicare Other

## 2023-05-10 ENCOUNTER — Encounter: Payer: Medicare Other | Admitting: Orthopedic Surgery

## 2023-05-10 VITALS — BP 130/78 | HR 90 | Temp 98.6°F | Resp 21 | Ht 62.0 in | Wt 137.4 lb

## 2023-05-10 DIAGNOSIS — E875 Hyperkalemia: Secondary | ICD-10-CM

## 2023-05-10 DIAGNOSIS — E063 Autoimmune thyroiditis: Secondary | ICD-10-CM | POA: Diagnosis not present

## 2023-05-10 DIAGNOSIS — F319 Bipolar disorder, unspecified: Secondary | ICD-10-CM | POA: Diagnosis not present

## 2023-05-10 DIAGNOSIS — R42 Dizziness and giddiness: Secondary | ICD-10-CM

## 2023-05-10 DIAGNOSIS — I10 Essential (primary) hypertension: Secondary | ICD-10-CM

## 2023-05-10 NOTE — Patient Instructions (Signed)
Please take blood pressure 2x/daily  Make sure you are drinking 40-60 ounces water daily  Make sure you are eating 1500 calories daily  Schedule lab visit in 8 weeks

## 2023-05-10 NOTE — Progress Notes (Signed)
Careteam: Patient Care Team: Octavia Heir, NP as PCP - General (Adult Health Nurse Practitioner)  Seen by: Hazle Nordmann, AGNP-C  PLACE OF SERVICE:  [facility]  Advanced Directive information    Allergies  Allergen Reactions   Contrast Media [Iodinated Contrast Media] Itching   Trintellix [Vortioxetine] Other (See Comments)    Led to mania   Dairycare [Bacid]    Doxycycline Hyclate Nausea And Vomiting    Double vision   Soy Allergy (Obsolete) Itching   Ciprofloxacin Rash   Levaquin [Levofloxacin] Rash   Penicillins Rash    Chief Complaint  Patient presents with   Medical Management of Chronic Issues    Routine visit      HPI: Patient is a 78 y.o. female seen today for medical management of chronic conditions.   Discussed the use of AI scribe software for clinical note transcription with the patient, who gave verbal consent to proceed.  History of Present Illness   She has experienced 2 episodes of hyperkalemia within past year.  Recent potassium was 5.6.  She attributes these elevations to her diet, which previously included high-potassium foods such as potato salad, spinach, bananas, and tomatoes. After eliminating these foods, her potassium levels returned to normal. Rechecked potassium was 4.6 05/04/2023. She is not on any diuretics.  She has a history of hypothyroidism, specifically Hashimoto's thyroiditis. She was previously prescribed 100 micrograms of levothyroxine but recently switched to 88 micrograms 0/2024. she had not taken new dose until a few days before TSH was rechecked.    She has experienced several dizzy spells, particularly after meals, which are accompanied by sweating. She recalls a significant episode during Thanksgiving after consuming a meal that included [place], squash, and sweet potatoes. She has not noticed a specific pattern related to the timing of these episodes. No recent changes in blood glucose levels and normal blood counts from recent lab  work. No black tarry stools or coughing up substances resembling coffee grounds.   She has bipolar disorder with depression and is stable on her current medications, Effexor and valproic acid. However, she is dissatisfied with her current psychiatrist's management of her prescriptions. She is compliant with her medication and reports doing well without recent need for counseling.      Review of Systems:  Review of Systems  Constitutional: Negative.   HENT: Negative.    Eyes: Negative.   Respiratory: Negative.    Cardiovascular: Negative.   Gastrointestinal: Negative.   Genitourinary: Negative.   Musculoskeletal:  Negative for falls and joint pain.  Skin: Negative.   Neurological:  Positive for dizziness.  Psychiatric/Behavioral:  Positive for depression. The patient is not nervous/anxious and does not have insomnia.     Past Medical History:  Diagnosis Date   Allergy    Anxiety 2000   Arthritis    Bipolar 1 disorder, depressed (HCC)    Bipolar disorder (HCC)    Chicken pox    Dementia (HCC)    Depression    Dysrhythmia 05/10/2020   pt states "cardiologist told me it is not dangerous, it's an anomaly"   Hashimoto's thyroiditis    History of blood transfusion    History of bone density study 2019   History of colonoscopy 2015   History of CT scan 2019   History of mammogram 2019   History of MRI    History of Papanicolaou smear of cervix    History of right hip replacement    Hx: UTI (urinary tract  infection)    Hypertension    Hypothyroidism    Lactose intolerance    Legally blind in left eye, as defined in [place]    Non-celiac gluten sensitivity    Osteoporosis    Pernicious anemia    Pernicious anemia    Rheumatic fever    Thyroid disease    Urinary and fecal incontinence    Past Surgical History:  Procedure Laterality Date   ABDOMINAL HYSTERECTOMY     1981   APPENDECTOMY  1981   EYE SURGERY Bilateral 2014   cataracts   LEFT HEART CATH AND CORONARY  ANGIOGRAPHY N/A 05/10/2020   Procedure: LEFT HEART CATH AND CORONARY ANGIOGRAPHY;  Surgeon: Yvonne Kendall, MD;  Location: [facility] INVASIVE CV LAB;  Service: Cardiovascular;  Laterality: N/A;   prolapsed rectum 2015     TONSILLECTOMY AND ADENOIDECTOMY  1966   TOTAL HIP ARTHROPLASTY Right 11/23/2020   Procedure: RIGHT TOTAL HIP ARTHROPLASTY ANTERIOR APPROACH;  Surgeon: Kathryne Hitch, MD;  Location: [facility] OR;  Service: Orthopedics;  Laterality: Right;   TUBAL LIGATION     Social History:   reports that she has never smoked. She has never used smokeless tobacco. She reports that she does not drink alcohol and does not use drugs.  Family History  Problem Relation Age of Onset   Heart disease Mother    Arthritis Mother    Cancer Father        bladder   Thyroid disease Father    Dementia Father    Hypertension Sister    Hypertension Brother    Hypertension Sister    Hypertension Sister    Diabetes Brother    Diabetes type II Son     Medications: Patient's Medications  New Prescriptions   No medications on file  Previous Medications   ACETAMINOPHEN (TYLENOL) 500 MG TABLET    Take 1,000 mg by mouth at bedtime.   ALENDRONATE (FOSAMAX) 70 MG TABLET    TAKE 1 TABLET(70 MG) BY MOUTH 1 TIME A WEEK WITH A FULL GLASS OF WATER AND ON AN EMPTY STOMACH   AMLODIPINE (NORVASC) 10 MG TABLET    Take 1 tablet by mouth once daily   ASPIRIN EC 81 MG TABLET    Take 81 mg by mouth daily. Swallow whole.   CALCIUM-VITAMIN D-VITAMIN K (VIACTIV CALCIUM PLUS D) 650-12.5-40 MG-MCG-MCG CHEW    Chew 2 tablets by mouth daily.   COVID-19 MRNA VACCINE 2023-2024 (COMIRNATY) SYRINGE    Inject into the muscle.   CYANOCOBALAMIN (VITAMIN B12) 1000 MCG/ML INJECTION    INJECT 1 ML INTRAMUSCULARLY  ONCE EVERY MONTH   DIVALPROEX (DEPAKOTE ER) 500 MG 24 HR TABLET    Take 1 tablet (500 mg total) by mouth at bedtime.   EMOLLIENT (AVEENO RESTORATIVE SKIN THERAP) CREA    Apply 1 application topically daily.   HALOBETASOL  (ULTRAVATE) 0.05 % OINTMENT    Apply 1 application topically daily as needed (eczema).   IPRATROPIUM (ATROVENT) 0.03 % NASAL SPRAY    Place 2 sprays into both nostrils every 12 (twelve) hours.   LEVOTHYROXINE (SYNTHROID) 88 MCG TABLET    Take 1 tablet (88 mcg total) by mouth daily.   LORATADINE (CLARITIN) 10 MG TABLET    Take 10 mg by mouth daily.   MECLIZINE (ANTIVERT) 50 MG TABLET    Take 1 tablet (50 mg total) by mouth every 6 (six) hours as needed.   MULTIPLE VITAMINS-MINERALS (CENTRUM SILVER ADULT 50+ PO)    Take 1 tablet  by mouth daily.   MULTIPLE VITAMINS-MINERALS (PRESERVISION AREDS) CAPS    Take 1 capsule by mouth in the morning and at bedtime.   OLMESARTAN (BENICAR) 20 MG TABLET    Take 1 tablet by mouth once daily   SYRINGE-NEEDLE, DISP, 3 ML (BD SAFETYGLIDE SYRINGE/NEEDLE) 25G X 1" 3 ML MISC    Inject 1ml in deltoid once monthly   VENLAFAXINE XR (EFFEXOR-XR) 150 MG 24 HR CAPSULE    Take 1 capsule (150 mg total) by mouth daily with breakfast.   WHEAT DEXTRIN (BENEFIBER PO)    Take by mouth. Take one teaspoon daily  Modified Medications   No medications on file  Discontinued Medications   No medications on file    Physical Exam:  Vitals:   05/10/23 1048  BP: 130/78  Pulse: 90  Resp: (!) 21  Temp: 98.6 F (37 C)  SpO2: 99%  Weight: 137 lb 6.4 oz (62.3 kg)  Height: 5\' 2"  (1.575 m)   Body mass index is 25.13 kg/m. Wt Readings from Last 3 Encounters:  05/10/23 137 lb 6.4 oz (62.3 kg)  10/26/22 138 lb 3.2 oz (62.7 kg)  07/06/22 137 lb (62.1 kg)    Physical Exam Vitals reviewed.  Constitutional:      General: She is not in acute distress. HENT:     Head: Normocephalic.  Eyes:     General:        Right eye: No discharge.        Left eye: No discharge.     Extraocular Movements:     Right eye: Normal extraocular motion and no nystagmus.     Left eye: Normal extraocular motion and no nystagmus.  Neck:     Vascular: No carotid bruit.  Cardiovascular:     Rate and  Rhythm: Normal rate and regular rhythm.     Pulses: Normal pulses.     Heart sounds: Normal heart sounds.  Pulmonary:     Effort: Pulmonary effort is normal.     Breath sounds: Normal breath sounds.  Abdominal:     General: Bowel sounds are normal.     Palpations: Abdomen is soft.  Musculoskeletal:     Cervical back: Neck supple.     Right lower leg: No edema.     Left lower leg: No edema.  Skin:    General: Skin is warm.     Capillary Refill: Capillary refill takes less than 2 seconds.  Neurological:     General: No focal deficit present.     Mental Status: She is alert and oriented to person, place, and time.  Psychiatric:        Mood and Affect: Mood normal.     Labs reviewed: Basic Metabolic Panel: Recent Labs    10/26/22 0843 11/08/22 0807 04/25/23 0847 05/04/23 0824  NA 138 136 139 138  K 5.5* 5.3 5.6* 4.6  CL 102 99 102 103  CO2 28 26 29 25   GLUCOSE 81 82 91 92  BUN 32* 24 35* 33*  CREATININE 0.75 0.87 0.78 0.96  CALCIUM 10.0 10.0 10.0 9.5  TSH 0.33*  --  0.67  --    Liver Function Tests: Recent Labs    04/25/23 0847  AST 18  ALT 13  BILITOT 0.4  PROT 7.3   No results for input(s): "LIPASE", "AMYLASE" in the last 8760 hours. No results for input(s): "AMMONIA" in the last 8760 hours. CBC: Recent Labs    10/26/22 0843 04/25/23 0847  WBC  8.5 7.1  NEUTROABS 5,228 3,699  HGB 10.7* 11.8  HCT 33.1* 35.6  MCV 83.2 87.5  PLT 393 337   Lipid Panel: No results for input(s): "CHOL", "HDL", "LDLCALC", "TRIG", "CHOLHDL", "LDLDIRECT" in the last 8760 hours. TSH: Recent Labs    10/26/22 0843 04/25/23 0847  TSH 0.33* 0.67   A1C: No results found for: "HGBA1C"   Assessment/Plan 1. Hyperkalemia (Primary) - 2 episodes within 6 months - K+ 5.6> rechecked> 4.6 - did not take kayexelate last episode - she admits to eating less foods high in potassium - Basic Metabolic Panel with eGFR  2. Primary hypertension - controlled with amlodipine and  olmesartan  3. Hypothyroidism due to Hashimoto thyroiditis - recent TSH .067 (02/05)> 0.33 (10/2022) - levothyroxine reduced to 88 mcg 10/2022> did not start till 04/2023 - plan to recheck TSH in 8 weeks  4. Bipolar 1 disorder, depressed (HCC) - followed by psychiatry - no mood changes - cont Depakote and Effexor  5. Dizziness - few episodes within last 4 months - hgb 11.8 - appears diet not always consistent - advised to take blood pressure 2x daily x 2 weeks and report readings  Total time: 36 minutes. Greater than 50% of total time spent doing patient education regarding hyperkalemia, HTN, Bipolar, hypothyroidism and dizziness including symptom/medication management.     Next appt: Visit date not found  Deziyah Arvin Scherry Ran  [facility] [phone]

## 2023-05-11 LAB — BASIC METABOLIC PANEL WITH GFR
BUN/Creatinine Ratio: 50 (calc) — ABNORMAL HIGH (ref 6–22)
BUN: 34 mg/dL — ABNORMAL HIGH (ref 7–25)
CO2: 28 mmol/L (ref 20–32)
Calcium: 9.9 mg/dL (ref 8.6–10.4)
Chloride: 101 mmol/L (ref 98–110)
Creat: 0.68 mg/dL (ref 0.60–1.00)
Glucose, Bld: 95 mg/dL (ref 65–139)
Potassium: 5.1 mmol/L (ref 3.5–5.3)
Sodium: 138 mmol/L (ref 135–146)
eGFR: 89 mL/min/{1.73_m2} (ref >=60)

## 2023-05-14 ENCOUNTER — Telehealth: Payer: Self-pay | Admitting: *Deleted

## 2023-05-14 ENCOUNTER — Other Ambulatory Visit: Payer: Self-pay | Admitting: Orthopedic Surgery

## 2023-05-14 DIAGNOSIS — F319 Bipolar disorder, unspecified: Secondary | ICD-10-CM

## 2023-05-14 NOTE — Telephone Encounter (Signed)
 Patient called and stated that you had referred her to Dr. Donell Beers (Psych) and their office is needing a referral to be placed in order to schedule an appointment.   Patient is requesting a referral to be placed.

## 2023-05-14 NOTE — Telephone Encounter (Signed)
 Referral placed.

## 2023-05-18 ENCOUNTER — Telehealth: Payer: Self-pay

## 2023-05-18 NOTE — Telephone Encounter (Signed)
 Patient called about referral and states that Dr.Plovsky office didn't receive. Order is being refaxed to [phone]

## 2023-05-23 ENCOUNTER — Emergency Department (HOSPITAL_COMMUNITY)

## 2023-05-23 ENCOUNTER — Other Ambulatory Visit: Payer: Self-pay

## 2023-05-23 ENCOUNTER — Emergency Department (HOSPITAL_COMMUNITY): Admission: EM | Admit: 2023-05-23 | Discharge: 2023-05-23 | Disposition: A | Discharge status: Home / Self Care

## 2023-05-23 DIAGNOSIS — E860 Dehydration: Secondary | ICD-10-CM | POA: Diagnosis not present

## 2023-05-23 DIAGNOSIS — I700 Atherosclerosis of aorta: Secondary | ICD-10-CM | POA: Diagnosis not present

## 2023-05-23 DIAGNOSIS — I959 Hypotension, unspecified: Secondary | ICD-10-CM | POA: Diagnosis not present

## 2023-05-23 DIAGNOSIS — R55 Syncope and collapse: Secondary | ICD-10-CM | POA: Diagnosis not present

## 2023-05-23 DIAGNOSIS — I951 Orthostatic hypotension: Secondary | ICD-10-CM | POA: Diagnosis not present

## 2023-05-23 DIAGNOSIS — Z7982 Long term (current) use of aspirin: Secondary | ICD-10-CM | POA: Diagnosis not present

## 2023-05-23 LAB — COMPREHENSIVE METABOLIC PANEL
ALT: 12 U/L (ref 0–44)
AST: 28 U/L (ref 15–41)
Albumin: 3.2 g/dL — ABNORMAL LOW (ref 3.5–5.0)
Alkaline Phosphatase: 40 U/L (ref 38–126)
Anion gap: 11 (ref 5–15)
BUN: 32 mg/dL — ABNORMAL HIGH (ref 8–23)
CO2: 23 mmol/L (ref 22–32)
Calcium: 8.5 mg/dL — ABNORMAL LOW (ref 8.9–10.3)
Chloride: 103 mmol/L (ref 98–111)
Creatinine, Ser: 0.84 mg/dL (ref 0.44–1.00)
GFR, Estimated: >60 mL/min (ref >60)
Glucose, Bld: 107 mg/dL — ABNORMAL HIGH (ref 70–99)
Potassium: 4.8 mmol/L (ref 3.5–5.1)
Sodium: 137 mmol/L (ref 135–145)
Total Bilirubin: 0.8 mg/dL (ref 0.0–1.2)
Total Protein: 6.2 g/dL — ABNORMAL LOW (ref 6.5–8.1)

## 2023-05-23 LAB — URINALYSIS, W/ REFLEX TO CULTURE (INFECTION SUSPECTED)
Bacteria, UA: NONE SEEN
Bilirubin Urine: NEGATIVE
Glucose, UA: NEGATIVE mg/dL
Hgb urine dipstick: NEGATIVE
Ketones, ur: 5 mg/dL — AB
Leukocytes,Ua: NEGATIVE
Nitrite: NEGATIVE
Protein, ur: NEGATIVE mg/dL
Specific Gravity, Urine: 1.019 (ref 1.005–1.030)
pH: 5 (ref 5.0–8.0)

## 2023-05-23 LAB — CBC WITH DIFFERENTIAL/PLATELET
Abs Immature Granulocytes: 0.05 10*3/uL (ref 0.00–0.07)
Basophils Absolute: 0 10*3/uL (ref 0.0–0.1)
Basophils Relative: 0 %
Eosinophils Absolute: 0.2 10*3/uL (ref 0.0–0.5)
Eosinophils Relative: 2 %
HCT: 31.4 % — ABNORMAL LOW (ref 36.0–46.0)
Hemoglobin: 10.3 g/dL — ABNORMAL LOW (ref 12.0–15.0)
Immature Granulocytes: 1 %
Lymphocytes Relative: 7 %
Lymphs Abs: 0.7 10*3/uL (ref 0.7–4.0)
MCH: 29 pg (ref 26.0–34.0)
MCHC: 32.8 g/dL (ref 30.0–36.0)
MCV: 88.5 fL (ref 80.0–100.0)
Monocytes Absolute: 0.5 10*3/uL (ref 0.1–1.0)
Monocytes Relative: 5 %
Neutro Abs: 8.4 10*3/uL — ABNORMAL HIGH (ref 1.7–7.7)
Neutrophils Relative %: 85 %
Platelets: 268 10*3/uL (ref 150–400)
RBC: 3.55 MIL/uL — ABNORMAL LOW (ref 3.87–5.11)
RDW: 14.5 % (ref 11.5–15.5)
WBC: 9.8 10*3/uL (ref 4.0–10.5)
nRBC: 0 % (ref 0.0–0.2)

## 2023-05-23 LAB — TROPONIN I (HIGH SENSITIVITY)
Troponin I (High Sensitivity): <2 ng/L (ref <18)
Troponin I (High Sensitivity): <2 ng/L (ref <18)

## 2023-05-23 LAB — LIPASE, BLOOD: Lipase: 33 U/L (ref 11–51)

## 2023-05-23 MED ORDER — SODIUM CHLORIDE 0.9 % IV BOLUS
1000.0000 mL | Freq: Once | INTRAVENOUS | Status: AC
  Administered 2023-05-23: 1000 mL via INTRAVENOUS

## 2023-05-23 NOTE — ED Provider Notes (Signed)
 [place] EMERGENCY DEPARTMENT AT [facility] Provider Note   CSN: [id number] Arrival date & time: 05/23/23  1132     History  Chief Complaint  Patient presents with   Hypotension    JAMMIE TROUP is a 78 y.o. female.  This is a 78 year old female presenting emergency department for near syncope.  Reportedly was in her assisted living facility lobby when she became lightheaded and felt like she was going to pass out.  Reported feeling hot and diaphoretic.  No chest pain, no palpitations.  Feels improved after EMS given IV fluids.  No viral/infectious symptoms past several days.  No nausea vomiting or diarrhea.  No abdominal pain.  She does take antihypertensives, took them this morning without checking her blood pressure.        Home Medications Prior to Admission medications   Medication Sig Start Date End Date Taking? Authorizing Provider  acetaminophen (TYLENOL) 500 MG tablet Take 1,000 mg by mouth at bedtime.    [provider]  alendronate (FOSAMAX) 70 MG tablet TAKE 1 TABLET(70 MG) BY MOUTH 1 TIME A WEEK WITH A FULL GLASS OF WATER AND ON AN EMPTY STOMACH 04/14/21   Fargo, Amy E, NP  amLODipine (NORVASC) 10 MG tablet Take 1 tablet by mouth once daily 03/22/23   Hazle Nordmann E, NP  aspirin EC 81 MG tablet Take 81 mg by mouth daily. Swallow whole.    [provider]  Calcium-Vitamin D-Vitamin K (VIACTIV CALCIUM PLUS D) 650-12.5-40 MG-MCG-MCG CHEW Chew 2 tablets by mouth daily.    [provider]  COVID-19 mRNA vaccine [phone] (COMIRNATY) syringe Inject into the muscle. 06/06/22     cyanocobalamin (VITAMIN B12) 1000 MCG/ML injection INJECT 1 ML INTRAMUSCULARLY  ONCE EVERY MONTH 05/07/23   Medina-Vargas, Monina C, NP  divalproex (DEPAKOTE ER) 500 MG 24 hr tablet Take 1 tablet (500 mg total) by mouth at bedtime. 03/01/18   Philip Aspen, Limmie Patricia, MD  Emollient (AVEENO RESTORATIVE SKIN THERAP) CREA Apply 1 application topically daily.     [provider]  halobetasol (ULTRAVATE) 0.05 % ointment Apply 1 application topically daily as needed (eczema). 07/19/20   [provider]  ipratropium (ATROVENT) 0.03 % nasal spray Place 2 sprays into both nostrils every 12 (twelve) hours. 04/27/22   Sharon Seller, NP  levothyroxine (SYNTHROID) 88 MCG tablet Take 1 tablet (88 mcg total) by mouth daily. 10/27/22   Fargo, Amy E, NP  loratadine (CLARITIN) 10 MG tablet Take 10 mg by mouth daily.    [provider]  meclizine (ANTIVERT) 50 MG tablet Take 1 tablet (50 mg total) by mouth every 6 (six) hours as needed. Patient not taking: Reported on 05/10/2023 03/16/22   Sharee Holster, NP  Multiple Vitamins-Minerals (CENTRUM SILVER ADULT 50+ PO) Take 1 tablet by mouth daily.    [provider]  Multiple Vitamins-Minerals (PRESERVISION AREDS) CAPS Take 1 capsule by mouth in the morning and at bedtime.    [provider]  olmesartan (BENICAR) 20 MG tablet Take 1 tablet by mouth once daily 08/21/22   Fargo, Amy E, NP  SYRINGE-NEEDLE, DISP, 3 ML (BD SAFETYGLIDE SYRINGE/NEEDLE) 25G X 1" 3 ML MISC Inject 1ml in deltoid once monthly 01/06/21   Coletta Memos, Amy E, NP  venlafaxine XR (EFFEXOR-XR) 150 MG 24 hr capsule Take 1 capsule (150 mg total) by mouth daily with breakfast. 05/17/21   Fargo, Amy E, NP  Wheat Dextrin (BENEFIBER PO) Take by mouth. Take one teaspoon  daily    [provider]      Allergies    Contrast media [iodinated contrast media], Trintellix [vortioxetine], Dairycare [bacid], Doxycycline hyclate, Soy allergy (obsolete), Ciprofloxacin, Levaquin [levofloxacin], and Penicillins    Review of Systems   Review of Systems  Physical Exam Updated Vital Signs BP (!) 130/52   Pulse 74   Temp 97.6 F (36.4 C) (Oral)   Resp (!) 21   Ht 5\' 2"  (1.575 m)   Wt 62.3 kg   SpO2 100%   BMI 25.13 kg/m  Physical Exam Vitals and nursing note reviewed.  Constitutional:      General: She is not in acute  distress.    Appearance: She is not toxic-appearing.  HENT:     Head: Normocephalic.     Nose: Nose normal.     Mouth/Throat:     Mouth: Mucous membranes are moist.  Eyes:     Conjunctiva/sclera: Conjunctivae normal.  Cardiovascular:     Rate and Rhythm: Normal rate and regular rhythm.  Pulmonary:     Effort: Pulmonary effort is normal.     Breath sounds: Normal breath sounds.  Abdominal:     General: Abdomen is flat. There is no distension.     Tenderness: There is no abdominal tenderness. There is no guarding or rebound.  Musculoskeletal:        General: Normal range of motion.     Right lower leg: No edema.     Left lower leg: No edema.  Skin:    General: Skin is warm and dry.     Capillary Refill: Capillary refill takes less than 2 seconds.  Neurological:     Mental Status: She is alert and oriented to person, place, and time.  Psychiatric:        Mood and Affect: Mood normal.        Behavior: Behavior normal.     ED Results / Procedures / Treatments   Labs (all labs ordered are listed, but only abnormal results are displayed) Labs Reviewed  CBC WITH DIFFERENTIAL/PLATELET - Abnormal; Notable for the following components:      Result Value   RBC 3.55 (*)    Hemoglobin 10.3 (*)    HCT 31.4 (*)    Neutro Abs 8.4 (*)    All other components within normal limits  COMPREHENSIVE METABOLIC PANEL - Abnormal; Notable for the following components:   Glucose, Bld 107 (*)    BUN 32 (*)    Calcium 8.5 (*)    Total Protein 6.2 (*)    Albumin 3.2 (*)    All other components within normal limits  URINALYSIS, W/ REFLEX TO CULTURE (INFECTION SUSPECTED) - Abnormal; Notable for the following components:   Ketones, ur 5 (*)    All other components within normal limits  LIPASE, BLOOD  TROPONIN I (HIGH SENSITIVITY)  TROPONIN I (HIGH SENSITIVITY)    EKG EKG Interpretation Date/Time:  Wednesday May 23 2023 11:43:13 EST Ventricular Rate:  73 PR Interval:  168 QRS  Duration:  76 QT Interval:  422 QTC Calculation: 465 R Axis:   58  Text Interpretation: Sinus rhythm Borderline low voltage, extremity leads Confirmed by Estanislado Pandy [phone]) on 05/23/2023 12:49:30 PM  Radiology No results found.  Procedures Procedures    Medications Ordered in ED Medications  sodium chloride 0.9 % bolus 1,000 mL (1,000 mLs Intravenous New Bag/Given 05/23/23 1317)    ED Course/ Medical Decision Making/ A&P Clinical Course as of 05/23/23 1442  Wed May 23, 2023  1152 Heart cath 05/10/20: "Conclusions: 1. Minimal luminal irregularity in the proximal RCA.  Left main also has an acute angle without significant stenosis.  Otherwise, no angiographically significant coronary artery disease. 2. Normal left ventricular contraction and filling pressure.   Recommendations: 1. Medical therapy and risk factor modification to prevent progression of disease. 2. Consider empiric therapy for microvascular dysfunction and/or vasospasm per Dr. Izora Ribas."  [TY]  1259 Orthostatic positive. IVFs ordered  [TY]    Clinical Course User Index [TY] Coral Spikes, DO                                 Medical Decision Making This is a well-appearing 78 year old female presenting emergency department after near syncopal episode.  EMS reported the patient was hypotensive 78/50 on arrival, but improved to 112/70 after 500 mL of normal saline.  Patient is feeling improved.  She is orthostatic positive here.  No fever tachycardia leukocytosis to suggest systemic infection.  No significant electrolyte derangements.  Normal kidney function.  No transaminitis to suggest hepatobiliary disease.  Lipase normal.  Pancreatitis unlikely.  No evidence of urinary tract infection on her UA.  Troponin negative, EKG appears to be normal sinus rhythm without ST segment changes to indicate ischemia.  She has ambulated to the bathroom after IV fluids with no symptoms.  Per chart review has had a normal cath in  2022.  Offered admission, but preferred outpatient follow-up.  Will follow-up with primary doctor.  Discussed taking blood pressure prior to taking antihypertensives.  With reassuring workup and asymptomatic on 9 January feel that she is appropriate for discharge with outpatient follow-up.  Discussed keeping up hydration as well.  Amount and/or Complexity of Data Reviewed Independent Historian: EMS External Data Reviewed:     Details: See ED course Labs: ordered. Decision-making details documented in ED Course. Radiology: ordered. Decision-making details documented in ED Course. ECG/medicine tests:  Decision-making details documented in ED Course.          Final Clinical Impression(s) / ED Diagnoses Final diagnoses:  None    Rx / DC Orders ED Discharge Orders     None         Coral Spikes, DO 05/23/23 1442

## 2023-05-23 NOTE — ED Triage Notes (Signed)
 Pt coming in from [facility] . Pt reports that she hasn't been hydrating well and called ems for dehydration.= feeling faint and dizzy. Ems reports initial bp 78/50 .Pt given 500 of NS .   Ems  Hr 80 Rr 20  Spo2 97% ra Cbg 137 Bp 112/70

## 2023-05-23 NOTE — ED Notes (Addendum)
 Orthostatic VS  Laying  HR 79 BP 122/60 map 79 Sitting  HR 86 BP 106/53 map 67 Standing  HR 94 BP 88/54(61)  Pt denies any dizziness at this time.

## 2023-05-23 NOTE — Discharge Instructions (Signed)
 Please follow-up with your primary doctor.  Please try to maintain adequate hydration with frequent sips of liquids.  Return immediately for fevers, chills, sudden onset headache, facial droop, unilateral weakness, chest pain, shortness of breath, palpitations, you pass out or you develop any new or worsening symptoms that are concerning to you.  As discussed, please take your blood pressure prior to taking your blood pressure medications.  If your blood pressure is at or below 120/60, please do not take your blood pressure medications.

## 2023-05-23 NOTE — ED Notes (Signed)
 Pt verbalized understanding of discharge instructions. IV removed. Pt ambulatory  at time of discharge. Pt sister to drive home.

## 2023-05-25 ENCOUNTER — Other Ambulatory Visit: Payer: Self-pay | Admitting: Orthopedic Surgery

## 2023-05-25 ENCOUNTER — Ambulatory Visit: Payer: Self-pay | Admitting: Orthopedic Surgery

## 2023-05-25 DIAGNOSIS — I10 Essential (primary) hypertension: Secondary | ICD-10-CM

## 2023-05-25 MED ORDER — AMLODIPINE BESYLATE 10 MG PO TABS: 5.0000 mg | ORAL_TABLET | Freq: Every day | ORAL | Status: DC

## 2023-05-25 NOTE — Telephone Encounter (Signed)
 Patient called and notified.

## 2023-05-25 NOTE — Telephone Encounter (Signed)
  Chief Complaint: Reported BP readings after ED visit on 05/23/2023 Symptoms: asymptomatic Frequency: BP readings since yesterday Pertinent Negatives: Patient denies CP, SOB Disposition: [] ED /[] Urgent Care (no appt availability in office) / [] Appointment(In office/virtual)/ []  [facility]/ [] Home Care/ [] Refused Recommended Disposition /[] [facility]/ [x]  Follow-up with PCP Additional Notes: patient called to report BP readings since yesterday. Patient states she was told in the ED that she should hold her BP medications if her BP is less than 120/80. Patient states she hasn't taken her medication. Patient is instructed to be mindful of her BP and if she started to feel worse to call us back. Patient does have a follow up scheduled with provider on 05/31/2023. Patient states she has no other needs at this time. Verbalized understanding of the plan and all questions answered.    Copied from CRM [id number]. Topic: Clinical - Red Word Triage >> May 25, 2023 10:09 AM Dondra Prader A wrote: Red Word that prompted transfer to Nurse Triage: Patient was taken to the hospital the day before yesterday and was diagnosed with suffering from very low blood pressure. She was advised to stop taking her bp medications.  Readings today: 7am:  118/65   just now: 119/71 Readings yesterday: 6:20am: 100/60    10am: 103/79  1pm: 118/65   7pm  118/65 Reason for Disposition  [1] Fall in systolic BP > 20 mm Hg from normal AND [2] NOT dizzy, lightheaded, or weak  Answer Assessment - Initial Assessment Questions 1. BLOOD PRESSURE: "What is the blood pressure?" "Did you take at least two measurements 5 minutes apart?"     118/65, 119/71 2. ONSET: "When did you take your blood pressure?"     This morning 3. HOW: "How did you obtain the blood pressure?" (e.g., visiting nurse, automatic home BP monitor)     Automatic home BP monitor 4. HISTORY: "Do you have a history of low blood pressure?" "What is your  blood pressure normally?"     Was seen in the ED on 05/23/2023 for low BP-patient was instructed to not take BP medication if BP is less than 120/80 5. MEDICINES: "Are you taking any medications for blood pressure?" If Yes, ask: "Have they been changed recently?"     Amlodipine and benicar 6. PULSE RATE: "Do you know what your pulse rate is?"      Unable to tell this RN 7. OTHER SYMPTOMS: "Have you been sick recently?" "Have you had a recent injury?"     no  Protocols used: Blood Pressure - Low-A-AH

## 2023-05-25 NOTE — Telephone Encounter (Signed)
 Reduce amlodipine to 5 mg (1/2 tablet) if blood pressures continue to be LESS THAN 120 systolic then stop medication.

## 2023-05-25 NOTE — Telephone Encounter (Signed)
Message routed to PCP Fargo, Amy E, NP  

## 2023-05-31 ENCOUNTER — Encounter: Payer: Self-pay | Admitting: Orthopedic Surgery

## 2023-05-31 ENCOUNTER — Ambulatory Visit (INDEPENDENT_AMBULATORY_CARE_PROVIDER_SITE_OTHER): Admitting: Orthopedic Surgery

## 2023-05-31 VITALS — BP 122/72 | HR 88 | Temp 97.8°F | Resp 17 | Ht 62.0 in | Wt 141.8 lb

## 2023-05-31 DIAGNOSIS — I10 Essential (primary) hypertension: Secondary | ICD-10-CM

## 2023-05-31 DIAGNOSIS — R55 Syncope and collapse: Secondary | ICD-10-CM | POA: Diagnosis not present

## 2023-05-31 DIAGNOSIS — R195 Other fecal abnormalities: Secondary | ICD-10-CM

## 2023-05-31 MED ORDER — AMLODIPINE BESYLATE 5 MG PO TABS: 5.0000 mg | ORAL_TABLET | Freq: Every day | ORAL | 2 refills | Status: AC

## 2023-05-31 NOTE — Patient Instructions (Addendum)
 Please take blood pressure every day before taking bp meds> if < 120/80 hold medication> drink more fluids and recheck bp  If you ever have vomiting or diarrhea consider Pedialite> its fluids loss!!! > you need to replenish hydration   Ok to have less than 2000 mg of sodium in diet daily   If you feel dizzy> safety first!!!! Sit and ask for help

## 2023-05-31 NOTE — Progress Notes (Signed)
 Careteam: Patient Care Team: Octavia Heir, NP as PCP - General (Adult Health Nurse Practitioner)  Seen by: Hazle Nordmann, AGNP-C  PLACE OF SERVICE:  [facility]  Advanced Directive information Does Patient Have a Medical Advance Directive?: Yes, Type of Advance Directive: Healthcare Power of [place];Living will;Out of facility DNR (pink MOST or yellow form), Does patient want to make changes to medical advance directive?: No - Guardian declined  Allergies  Allergen Reactions   Contrast Media [Iodinated Contrast Media] Itching   Trintellix [Vortioxetine] Other (See Comments)    Led to mania   Dairycare [Bacid]    Doxycycline Hyclate Nausea And Vomiting    Double vision   Soy Allergy (Obsolete) Itching   Ciprofloxacin Rash   Levaquin [Levofloxacin] Rash   Penicillins Rash    Chief Complaint  Patient presents with   Hospitalization Follow-up     HPI: Patient is a 78 y.o. female seen today for f/u s/p ED visit 03/05 due to syncope.   Discussed the use of AI scribe software for clinical note transcription with the patient, who gave verbal consent to proceed.  05/23/2023 she experienced a significant episode of dizziness and nearly fainted, prompting an emergency room visit. At the ED, her blood pressure was recorded at 78/50 mmHg, and she received IV fluids, which improved her blood pressure to 112/70 mmHg. She reports having diarrhea prior to ED visit. Does not always drink fluids well. She was advised to hold amlodipine for a few days and always check blood pressure prior to taking bp medications.   She is currently on two blood pressure medications: amlodipine and olmesartan. After ED visit, her SBP was around 100. She was advised by PCP to reduce amlodipine to 5 mg daily. Her blood pressure readings have varied, with a high of 188/78 mmHg and a low of 103/79 mmHg. She took both medications on the morning of the visit, with a blood pressure reading of 122/72 mmHg.  She reports  experiencing loose stools and associates coughing with involuntary bowel movements. She is currently taking MiraLAX as needed. She has been on 88 mg of thyroid medication since February and is due for a thyroid function test to assess her current levels.      Review of Systems:  Review of Systems  Constitutional: Negative.   HENT: Negative.    Respiratory: Negative.    Cardiovascular: Negative.   Gastrointestinal:  Positive for diarrhea. Negative for abdominal pain.  Genitourinary: Negative.   Musculoskeletal: Negative.  Negative for falls.  Skin: Negative.   Neurological:  Positive for dizziness.  Psychiatric/Behavioral:  Positive for depression. The patient is nervous/anxious.     Past Medical History:  Diagnosis Date   Allergy    Anxiety 2000   Arthritis    Bipolar 1 disorder, depressed (HCC)    Bipolar disorder (HCC)    Chicken pox    Dementia (HCC)    Depression    Dysrhythmia 05/10/2020   pt states "cardiologist told me it is not dangerous, it's an anomaly"   Hashimoto's thyroiditis    History of blood transfusion    History of bone density study 2019   History of colonoscopy 2015   History of CT scan 2019   History of mammogram 2019   History of MRI    History of Papanicolaou smear of cervix    History of right hip replacement    Hx: UTI (urinary tract infection)    Hypertension    Hypothyroidism  Lactose intolerance    Legally blind in left eye, as defined in [place]    Non-celiac gluten sensitivity    Osteoporosis    Pernicious anemia    Pernicious anemia    Rheumatic fever    Thyroid disease    Urinary and fecal incontinence    Past Surgical History:  Procedure Laterality Date   ABDOMINAL HYSTERECTOMY     1981   APPENDECTOMY  1981   EYE SURGERY Bilateral 2014   cataracts   LEFT HEART CATH AND CORONARY ANGIOGRAPHY N/A 05/10/2020   Procedure: LEFT HEART CATH AND CORONARY ANGIOGRAPHY;  Surgeon: Yvonne Kendall, MD;  Location: [facility] INVASIVE CV LAB;   Service: Cardiovascular;  Laterality: N/A;   prolapsed rectum 2015     TONSILLECTOMY AND ADENOIDECTOMY  1966   TOTAL HIP ARTHROPLASTY Right 11/23/2020   Procedure: RIGHT TOTAL HIP ARTHROPLASTY ANTERIOR APPROACH;  Surgeon: Kathryne Hitch, MD;  Location: [facility] OR;  Service: Orthopedics;  Laterality: Right;   TUBAL LIGATION     Social History:   reports that she has never smoked. She has never used smokeless tobacco. She reports that she does not drink alcohol and does not use drugs.  Family History  Problem Relation Age of Onset   Heart disease Mother    Arthritis Mother    Cancer Father        bladder   Thyroid disease Father    Dementia Father    Hypertension Sister    Hypertension Brother    Hypertension Sister    Hypertension Sister    Diabetes Brother    Diabetes type II Son     Medications: Patient's Medications  New Prescriptions   AMLODIPINE (NORVASC) 5 MG TABLET    Take 1 tablet (5 mg total) by mouth daily.  Previous Medications   ACETAMINOPHEN (TYLENOL) 500 MG TABLET    Take 1,000 mg by mouth at bedtime.   ALENDRONATE (FOSAMAX) 70 MG TABLET    TAKE 1 TABLET(70 MG) BY MOUTH 1 TIME A WEEK WITH A FULL GLASS OF WATER AND ON AN EMPTY STOMACH   ASPIRIN EC 81 MG TABLET    Take 81 mg by mouth daily. Swallow whole.   CALCIUM-VITAMIN D-VITAMIN K (VIACTIV CALCIUM PLUS D) 650-12.5-40 MG-MCG-MCG CHEW    Chew 2 tablets by mouth daily.   COVID-19 MRNA VACCINE 2023-2024 (COMIRNATY) SYRINGE    Inject into the muscle.   CYANOCOBALAMIN (VITAMIN B12) 1000 MCG/ML INJECTION    INJECT 1 ML INTRAMUSCULARLY  ONCE EVERY MONTH   DIVALPROEX (DEPAKOTE ER) 500 MG 24 HR TABLET    Take 1 tablet (500 mg total) by mouth at bedtime.   EMOLLIENT (AVEENO RESTORATIVE SKIN THERAP) CREA    Apply 1 application topically daily.   HALOBETASOL (ULTRAVATE) 0.05 % OINTMENT    Apply 1 application topically daily as needed (eczema).   IPRATROPIUM (ATROVENT) 0.03 % NASAL SPRAY    Place 2 sprays into both nostrils  every 12 (twelve) hours.   LEVOTHYROXINE (SYNTHROID) 88 MCG TABLET    Take 1 tablet (88 mcg total) by mouth daily.   LORATADINE (CLARITIN) 10 MG TABLET    Take 10 mg by mouth daily.   MECLIZINE (ANTIVERT) 50 MG TABLET    Take 1 tablet (50 mg total) by mouth every 6 (six) hours as needed.   MULTIPLE VITAMINS-MINERALS (CENTRUM SILVER ADULT 50+ PO)    Take 1 tablet by mouth daily.   MULTIPLE VITAMINS-MINERALS (PRESERVISION AREDS) CAPS    Take 1 capsule  by mouth in the morning and at bedtime.   OLMESARTAN (BENICAR) 20 MG TABLET    Take 1 tablet by mouth once daily   SYRINGE-NEEDLE, DISP, 3 ML (BD SAFETYGLIDE SYRINGE/NEEDLE) 25G X 1" 3 ML MISC    Inject 1ml in deltoid once monthly   VENLAFAXINE XR (EFFEXOR-XR) 150 MG 24 HR CAPSULE    Take 1 capsule (150 mg total) by mouth daily with breakfast.   WHEAT DEXTRIN (BENEFIBER PO)    Take by mouth. Take one teaspoon daily  Modified Medications   No medications on file  Discontinued Medications   AMLODIPINE (NORVASC) 10 MG TABLET    Take 0.5 tablets (5 mg total) by mouth daily.    Physical Exam:  Vitals:   05/31/23 0845  BP: 122/72  Pulse: 88  Resp: 17  Temp: 97.8 F (36.6 C)  SpO2: 96%  Weight: 141 lb 12.8 oz (64.3 kg)  Height: 5\' 2"  (1.575 m)   Body mass index is 25.94 kg/m. Wt Readings from Last 3 Encounters:  05/31/23 141 lb 12.8 oz (64.3 kg)  05/23/23 137 lb 6.4 oz (62.3 kg)  05/10/23 137 lb 6.4 oz (62.3 kg)    Physical Exam Vitals reviewed.  Constitutional:      General: She is not in acute distress. HENT:     Head: Normocephalic.  Eyes:     General:        Right eye: No discharge.        Left eye: No discharge.  Neck:     Vascular: No carotid bruit.  Cardiovascular:     Rate and Rhythm: Normal rate and regular rhythm.     Pulses: Normal pulses.     Heart sounds: Normal heart sounds.  Pulmonary:     Effort: Pulmonary effort is normal. No respiratory distress.     Breath sounds: Normal breath sounds. No wheezing or  rales.  Abdominal:     General: Bowel sounds are normal. There is no distension.     Palpations: Abdomen is soft.     Tenderness: There is no abdominal tenderness.  Musculoskeletal:     Cervical back: Neck supple.     Right lower leg: No edema.     Left lower leg: No edema.  Skin:    General: Skin is warm.     Capillary Refill: Capillary refill takes less than 2 seconds.  Neurological:     General: No focal deficit present.     Mental Status: She is alert and oriented to person, place, and time.     Gait: Gait abnormal.  Psychiatric:        Mood and Affect: Mood normal.     Labs reviewed: Basic Metabolic Panel: Recent Labs    10/26/22 0843 11/08/22 0807 04/25/23 0847 05/04/23 0824 05/10/23 1140 05/23/23 1152  NA 138   < > 139 138 138 137  K 5.5*   < > 5.6* 4.6 5.1 4.8  CL 102   < > 102 103 101 103  CO2 28   < > 29 25 28 23   GLUCOSE 81   < > 91 92 95 107*  BUN 32*   < > 35* 33* 34* 32*  CREATININE 0.75   < > 0.78 0.96 0.68 0.84  CALCIUM 10.0   < > 10.0 9.5 9.9 8.5*  TSH 0.33*  --  0.67  --   --   --    < > = values in this interval not displayed.  Liver Function Tests: Recent Labs    04/25/23 0847 05/23/23 1152  AST 18 28  ALT 13 12  ALKPHOS  --  40  BILITOT 0.4 0.8  PROT 7.3 6.2*  ALBUMIN  --  3.2*   Recent Labs    05/23/23 1152  LIPASE 33   No results for input(s): "AMMONIA" in the last 8760 hours. CBC: Recent Labs    10/26/22 0843 04/25/23 0847 05/23/23 1152  WBC 8.5 7.1 9.8  NEUTROABS 5,228 3,699 8.4*  HGB 10.7* 11.8 10.3*  HCT 33.1* 35.6 31.4*  MCV 83.2 87.5 88.5  PLT 393 337 268   Lipid Panel: No results for input(s): "CHOL", "HDL", "LDLCALC", "TRIG", "CHOLHDL", "LDLDIRECT" in the last 8760 hours. TSH: Recent Labs    10/26/22 0843 04/25/23 0847  TSH 0.33* 0.67   A1C: No results found for: "HGBA1C"   Assessment/Plan 1. Near syncope (Primary) - 03/05 ED visit > initial SBP 70's - condition improved with IVF - lab work and  EKG unremarkable - advised to hold amlodipine for a few days then restart> eventually advised to take 5 mg daily - no other episodes - reports diarrhea prior to syncope - suspect syncope associated with dehydration - cont to take bp prior to administration - encourage hydration with water daily 40-60 ounces - falls safety discussed  2. Essential hypertension - see above - reduce amlodipine to 5 mg daily - cont olmesartan - amLODipine (NORVASC) 5 MG tablet; Take 1 tablet (5 mg total) by mouth daily.  Dispense: 90 tablet; Refill: 2 - Basic Metabolic Panel with eGFR; Future  3. Loose stools - intermittent diarrhea and loose stools - discussed starting fiber capsules 2x/week> increase slowly until formed stools  Total time: 32 minutes. Greater than 50% of total time spent doing patient education regarding elevated blood pressure, dizziness and loose stools including symptom/medication management.      Next appt: 11/08/2023  Hazle Nordmann, Juel Burrow  [facility] [phone]

## 2023-07-03 ENCOUNTER — Other Ambulatory Visit: Payer: Self-pay

## 2023-07-03 DIAGNOSIS — I10 Essential (primary) hypertension: Secondary | ICD-10-CM

## 2023-07-03 DIAGNOSIS — E063 Autoimmune thyroiditis: Secondary | ICD-10-CM

## 2023-07-04 ENCOUNTER — Ambulatory Visit (HOSPITAL_BASED_OUTPATIENT_CLINIC_OR_DEPARTMENT_OTHER): Admitting: Psychiatry

## 2023-07-04 ENCOUNTER — Other Ambulatory Visit: Payer: Self-pay

## 2023-07-04 ENCOUNTER — Encounter (HOSPITAL_COMMUNITY): Payer: Self-pay | Admitting: Psychiatry

## 2023-07-04 VITALS — BP 127/68 | HR 105 | Ht 64.0 in | Wt 142.0 lb

## 2023-07-04 DIAGNOSIS — F3130 Bipolar disorder, current episode depressed, mild or moderate severity, unspecified: Secondary | ICD-10-CM | POA: Diagnosis not present

## 2023-07-04 DIAGNOSIS — F319 Bipolar disorder, unspecified: Secondary | ICD-10-CM

## 2023-07-04 MED ORDER — VENLAFAXINE HCL ER 75 MG PO CP24: ORAL_CAPSULE | ORAL | 2 refills | Status: DC

## 2023-07-04 MED ORDER — DIVALPROEX SODIUM ER 500 MG PO TB24: 500.0000 mg | ORAL_TABLET | Freq: Every day | ORAL | 6 refills | Status: DC

## 2023-07-04 NOTE — Progress Notes (Signed)
 Psychiatric Initial Adult Assessment   Patient Identification: Jackie Jones MRN:  [id number] Date of Evaluation:  07/04/2023 Referral Source: Hazle Nordmann Chief Complaint: Depression Visit Diagnosis:    This patient is an 78 year old divorced female who lives in an independent living [address].  She is here to be followed for her diagnosis of bipolar disorder.  5 years ago she moved from [place] to [place].  She has been divorced over 10 years.  She has 1 child and adopted son who lives in [place].  The patient has been a retired [employer] officer since 1995.  Today she denies daily depression.  She is sleeping and eating well.  She has got good energy.  She has no problems thinking or concentrating and she has a good sense of worth.  She is not suicidal now and never has been.  She enjoys reading and watching TV and going to the activities at the [facility] in [place].  Patient denies use of alcohol or drugs.  She has never had psychotic symptoms.  15 years ago she had a clear episode of major depression with multiple vegetative symptoms.  She had suicidal ideation but never took action.  She was psychiatrically hospitalized at that time and psychiatrically hospitalized one more time afterwards.  The patient also describes a clear episode of mania in 2013.  Then she has stated being euphoric.  Her activity level was heightened her self-esteem was inflated and she spent a great deal of money.  She had rapid speech and rapid thinking.  She had no need for sleep.  She demonstrated a full clear episode of mania.  She says she has had periods like this in the distant past but it was never focused on her diagnosed.  The patient takes Depakote regularly for this condition and does well.  She denies symptoms of generalized anxiety disorder or panic disorder.  She has no systems symptoms of OCD.  Financially stable.  She had a sister who died about a year ago.  The patient had a  Depakote level last time of approximately 35. Her past psychiatric history is significant for 2 psychiatric hospitalizations.  She was under the care of Dr. Simon Rhein for a few years but had some problems getting her medicines at correctly.  The patient has been in therapy in the distant past. At this time the patient is functioning extremely well.  She is independent and her thoughts are clear and organized she does all her basic ADLs.   ICD-10-CM   1. Bipolar 1 disorder, depressed (HCC)  F31.9 divalproex (DEPAKOTE ER) 500 MG 24 hr tablet      History of Present Illness:    Associated Signs/Symptoms: Depression Symptoms:   (Hypo) Manic Symptoms:   Anxiety Symptoms:   Psychotic Symptoms:   PTSD Symptoms: NA  Past Psychiatric History: 2 psychiatric hospitalizations.  Previous Psychotropic Medications: Yes   Substance Abuse History in the last 12 months:  No.  Consequences of Substance Abuse: NA  Past Medical History:  Past Medical History:  Diagnosis Date   Allergy    Anxiety 2000   Arthritis    Bipolar 1 disorder, depressed (HCC)    Bipolar disorder (HCC)    Chicken pox    Dementia (HCC)    Depression    Dysrhythmia 05/10/2020   pt states "cardiologist told me it is not dangerous, it's an anomaly"   Hashimoto's thyroiditis    History of blood transfusion    History of bone  density study 2019   History of colonoscopy 2015   History of CT scan 2019   History of mammogram 2019   History of MRI    History of Papanicolaou smear of cervix    History of right hip replacement    Hx: UTI (urinary tract infection)    Hypertension    Hypothyroidism    Lactose intolerance    Legally blind in left eye, as defined in [place]    Non-celiac gluten sensitivity    Osteoporosis    Pernicious anemia    Pernicious anemia    Rheumatic fever    Thyroid disease    Urinary and fecal incontinence     Past Surgical History:  Procedure Laterality Date   ABDOMINAL HYSTERECTOMY      1981   APPENDECTOMY  1981   EYE SURGERY Bilateral 2014   cataracts   LEFT HEART CATH AND CORONARY ANGIOGRAPHY N/A 05/10/2020   Procedure: LEFT HEART CATH AND CORONARY ANGIOGRAPHY;  Surgeon: Yvonne Kendall, MD;  Location: [facility];  Service: Cardiovascular;  Laterality: N/A;   prolapsed rectum 2015     TONSILLECTOMY AND ADENOIDECTOMY  1966   TOTAL HIP ARTHROPLASTY Right 11/23/2020   Procedure: RIGHT TOTAL HIP ARTHROPLASTY ANTERIOR APPROACH;  Surgeon: Kathryne Hitch, MD;  Location: [facility] OR;  Service: Orthopedics;  Laterality: Right;   TUBAL LIGATION      Family Psychiatric History:   Family History:  Family History  Problem Relation Age of Onset   Heart disease Mother    Arthritis Mother    Cancer Father        bladder   Thyroid disease Father    Dementia Father    Hypertension Sister    Hypertension Brother    Hypertension Sister    Hypertension Sister    Diabetes Brother    Diabetes type II Son     Social History:   Social History   Socioeconomic History   Marital status: Single    Spouse name: Not on file   Number of children: 1   Years of education: Not on file   Highest education level: Master's degree (e.g., MA, MS, MEng, MEd, MSW, MBA)  Occupational History   Not on file  Tobacco Use   Smoking status: Never   Smokeless tobacco: Never  Vaping Use   Vaping status: Never Used  Substance and Sexual Activity   Alcohol use: Never   Drug use: Never   Sexual activity: Not Currently  Other Topics Concern   Not on file  Social History Narrative   Tobacco use, amount per day now: None.   Past tobacco use, amount per day: None.   How many years did you use tobacco: None.   Alcohol use (drinks per week): None.   Diet: I eat a low fat diet. Much in vegetables.    Do you drink/eat things with caffeine: Yes.   Marital status:  Divorced                                What year were you married? 1976   Do you live in a house, apartment, assisted  living, condo, trailer, etc.? Rent small house.   Is it one or more stories? One.   How many persons live in your home? I live alone.    Do you have pets in your home?( please list) No.   Highest Level of education completed?  Masters Degree Plus.   Current or past profession: Runner, broadcasting/film/video.   Do you exercise? Yes.                                  Type and how often? 5 days a week I walk.    Do you have a living will? Yes.   Do you have a DNR form?    Yes                               If not, do you want to discuss one?   Do you have signed POA/HPOA forms?  Yes.                      If so, please bring to you appointment      Do you have any difficulty bathing or dressing yourself? No.   Do you have any difficulty preparing food or eating? No.   Do you have any difficulty managing your medications? No.   Do you have any difficulty managing your finances? Yes.   Do you have any difficulty affording your medications? No.   Social Drivers of Health   Financial Resource Strain: Medium Risk (05/09/2023)   Overall Financial Resource Strain (CARDIA)    Difficulty of Paying Living Expenses: Somewhat hard  Food Insecurity: No Food Insecurity (05/09/2023)   Hunger Vital Sign    Worried About Running Out of Food in the Last Year: Never true    Ran Out of Food in the Last Year: Never true  Transportation Needs: No Transportation Needs (05/09/2023)   PRAPARE - Administrator, Civil Service (Medical): No    Lack of Transportation (Non-Medical): No  Physical Activity: Sufficiently Active (05/09/2023)   Exercise Vital Sign    Days of Exercise per Week: 3 days    Minutes of Exercise per Session: 60 min  Stress: No Stress Concern Present (05/09/2023)   [employer] of Occupational Health - Occupational Stress Questionnaire    Feeling of Stress : Not at all  Social Connections: Moderately Isolated (05/09/2023)   Social Connection and Isolation Panel [NHANES]    Frequency of Communication with  Friends and Family: More than three times a week    Frequency of Social Gatherings with Friends and Family: Once a week    Attends Religious Services: More than 4 times per year    Active Member of [employer] or Organizations: No    Attends Engineer, structural: Not on file    Marital Status: Divorced    Additional Social History:   Allergies:   Allergies  Allergen Reactions   Contrast Media [Iodinated Contrast Media] Itching   Trintellix [Vortioxetine] Other (See Comments)    Led to mania   Dairycare [Bacid]    Doxycycline Hyclate Nausea And Vomiting    Double vision   Soy Allergy (Obsolete) Itching   Ciprofloxacin Rash   Levaquin [Levofloxacin] Rash   Penicillins Rash    Metabolic Disorder Labs: No results found for: "HGBA1C", "MPG" No results found for: "PROLACTIN" Lab Results  Component Value Date   CHOL 165 09/25/2018   TRIG 56.0 09/25/2018   HDL 74.50 09/25/2018   CHOLHDL 2 09/25/2018   VLDL 11.2 09/25/2018   LDLCALC 79 09/25/2018   Lab Results  Component Value Date   TSH 0.67 04/25/2023  Therapeutic Level Labs: No results found for: "LITHIUM" No results found for: "CBMZ" Lab Results  Component Value Date   VALPROATE 34.1 (L) 10/26/2022    Current Medications: Current Outpatient Medications  Medication Sig Dispense Refill   acetaminophen (TYLENOL) 500 MG tablet Take 1,000 mg by mouth at bedtime.     alendronate (FOSAMAX) 70 MG tablet TAKE 1 TABLET(70 MG) BY MOUTH 1 TIME A WEEK WITH A FULL GLASS OF WATER AND ON AN EMPTY STOMACH 12 tablet 3   amLODipine (NORVASC) 5 MG tablet Take 1 tablet (5 mg total) by mouth daily. 90 tablet 2   aspirin EC 81 MG tablet Take 81 mg by mouth daily. Swallow whole.     Calcium-Vitamin D-Vitamin K (VIACTIV CALCIUM PLUS D) 650-12.5-40 MG-MCG-MCG CHEW Chew 2 tablets by mouth daily.     COVID-19 mRNA vaccine 2023-2024 (COMIRNATY) syringe Inject into the muscle. 0.3 mL 0   cyanocobalamin (VITAMIN B12) 1000 MCG/ML injection  INJECT 1 ML INTRAMUSCULARLY  ONCE EVERY MONTH 3 mL 2   Emollient (AVEENO RESTORATIVE SKIN THERAP) CREA Apply 1 application topically daily.     halobetasol (ULTRAVATE) 0.05 % ointment Apply 1 application topically daily as needed (eczema).     ipratropium (ATROVENT) 0.03 % nasal spray Place 2 sprays into both nostrils every 12 (twelve) hours. 30 mL 3   levothyroxine (SYNTHROID) 88 MCG tablet Take 1 tablet (88 mcg total) by mouth daily. 90 tablet 3   loratadine (CLARITIN) 10 MG tablet Take 10 mg by mouth daily.     Multiple Vitamins-Minerals (CENTRUM SILVER ADULT 50+ PO) Take 1 tablet by mouth daily.     Multiple Vitamins-Minerals (PRESERVISION AREDS) CAPS Take 1 capsule by mouth in the morning and at bedtime.     olmesartan (BENICAR) 20 MG tablet Take 1 tablet by mouth once daily 90 tablet 3   SYRINGE-NEEDLE, DISP, 3 ML (BD SAFETYGLIDE SYRINGE/NEEDLE) 25G X 1" 3 ML MISC Inject 1ml in deltoid once monthly 100 each 11   Wheat Dextrin (BENEFIBER PO) Take by mouth. Take one teaspoon daily     divalproex (DEPAKOTE ER) 500 MG 24 hr tablet Take 1 tablet (500 mg total) by mouth at bedtime. 30 tablet 6   meclizine (ANTIVERT) 50 MG tablet Take 1 tablet (50 mg total) by mouth every 6 (six) hours as needed. (Patient not taking: Reported on 05/10/2023) 30 tablet 0   venlafaxine XR (EFFEXOR-XR) 75 MG 24 hr capsule 1 qam 90 capsule 2   No current facility-administered medications for this visit.    Musculoskeletal: Strength & Muscle Tone: within normal limits Gait & Station: unsteady Patient leans: N/A  Psychiatric Specialty Exam: Review of Systems  Blood pressure 127/68, pulse (!) 105, height 5\' 4"  (1.626 m), weight 142 lb (64.4 kg).Body mass index is 24.37 kg/m.  General Appearance: Fairly Groomed  Eye Contact:  Good  Speech:  Normal Rate  Volume:  Normal  Mood:  NA  Affect:  Appropriate  Thought Process:  Goal Directed  Orientation:  Full (Time, Place, and Person)  Thought Content:  WDL   Suicidal Thoughts:  No  Homicidal Thoughts:  No  Memory:  NA  Judgement:  Good  Insight:  Good  Psychomotor Activity:  Normal  Concentration:    Recall:  Good  Fund of Knowledge:Good  Language: Good  Akathisia:  No  Handed:  Right  AIMS (if indicated):  not done  Assets:  Desire for Improvement  ADL's:  Intact  Cognition: WNL  Sleep:  Good   Screenings: Mini-Mental    Flowsheet Row Office Visit from 10/20/2021 in [facility] Office Visit from 05/28/2020 in [facility] & Adult [facility]  Total Score (max 30 points ) 30 27      PHQ2-9    Flowsheet Row Office Visit from 05/31/2023 in [facility] & Adult Medicine Erroneous Encounter from 07/06/2022 in [facility] & Adult Medicine Office Visit from 06/30/2021 in [facility] & Adult Medicine Clinical Support from 10/07/2019 in [facility] [place] Office Visit from 10/30/2018 in [facility] [place]  PHQ-2 Total Score 0 0 0 0 0  PHQ-9 Total Score -- -- -- 0 0      Flowsheet Row ED from 05/23/2023 in [facility] Emergency Department at [facility] Admission (Discharged) from 11/23/2020 in [facility]  [facility] Pre-Admission Testing 60 from 11/15/2020 in [facility] PREADMISSION TESTING  C-SSRS RISK CATEGORY No Risk No Risk No Risk       Assessment and Plan:   This patient's diagnosis is bipolar disorder in remission.  At this time she shows no affective symptomatology.  Her stress level was very good.  She lives in a good place and she is happy with life.  She has a son in [place] .  The patient functions independently.  Today we will continue her Depakote 500 mg which gives her a Depakote level of approximately 35.  All this is not completely therapeutic we will continue it at this dose.  She also takes Effexor 75 mg.  She  will continue taking this dose of medicine and return to see me in 3 months.  Collaboration of Care:   Patient/Guardian was advised Release of Information must be obtained prior to any record release in order to collaborate their care with an outside provider. Patient/Guardian was advised if they have not already done so to contact the registration department to sign all necessary forms in order for us  to release information regarding their care.   Consent: Patient/Guardian gives verbal consent for treatment and assignment of benefits for services provided during this visit. Patient/Guardian expressed understanding and agreed to proceed.   Delorse Fey, MD 4/16/20253:03 PM

## 2023-07-05 ENCOUNTER — Other Ambulatory Visit: Payer: Medicare Other

## 2023-07-05 DIAGNOSIS — I10 Essential (primary) hypertension: Secondary | ICD-10-CM | POA: Diagnosis not present

## 2023-07-05 DIAGNOSIS — E063 Autoimmune thyroiditis: Secondary | ICD-10-CM | POA: Diagnosis not present

## 2023-07-06 LAB — TSH: TSH: 1.54 m[IU]/L (ref 0.40–4.50)

## 2023-07-06 LAB — BASIC METABOLIC PANEL WITHOUT GFR
BUN/Creatinine Ratio: 35 (calc) — ABNORMAL HIGH (ref 6–22)
BUN: 29 mg/dL — ABNORMAL HIGH (ref 7–25)
CO2: 29 mmol/L (ref 20–32)
Calcium: 10 mg/dL (ref 8.6–10.4)
Chloride: 102 mmol/L (ref 98–110)
Creat: 0.84 mg/dL (ref 0.60–0.95)
Glucose, Bld: 94 mg/dL (ref 65–139)
Potassium: 5.2 mmol/L (ref 3.5–5.3)
Sodium: 139 mmol/L (ref 135–146)

## 2023-07-09 ENCOUNTER — Encounter: Payer: Self-pay | Admitting: Orthopedic Surgery

## 2023-08-15 ENCOUNTER — Telehealth (HOSPITAL_COMMUNITY): Payer: Self-pay

## 2023-08-15 NOTE — Telephone Encounter (Signed)
 I called the patient and she said that she will let us  know if her symptoms persist past the weekend.

## 2023-08-15 NOTE — Telephone Encounter (Signed)
 Patient is calling because today was the first day that she took the lower dose of Venlafaxine . She was on 150 mg, you reduced it to 75 mg. She said that she took it around 06:15 this morning and she is feeling dizzy and nauseous. She knows that it may take some time to get used to the reduction and just wants to know if this is normal. Please review and advise, thank you

## 2023-08-22 ENCOUNTER — Other Ambulatory Visit: Payer: Self-pay | Admitting: Orthopedic Surgery

## 2023-08-22 DIAGNOSIS — I10 Essential (primary) hypertension: Secondary | ICD-10-CM

## 2023-10-03 ENCOUNTER — Encounter (HOSPITAL_COMMUNITY): Payer: Self-pay | Admitting: Psychiatry

## 2023-10-03 ENCOUNTER — Other Ambulatory Visit: Payer: Self-pay

## 2023-10-03 ENCOUNTER — Ambulatory Visit (HOSPITAL_BASED_OUTPATIENT_CLINIC_OR_DEPARTMENT_OTHER): Admitting: Psychiatry

## 2023-10-03 VITALS — BP 122/70 | HR 106 | Ht 64.0 in | Wt 147.0 lb

## 2023-10-03 DIAGNOSIS — F319 Bipolar disorder, unspecified: Secondary | ICD-10-CM | POA: Diagnosis not present

## 2023-10-03 MED ORDER — VENLAFAXINE HCL ER 75 MG PO CP24: ORAL_CAPSULE | ORAL | 3 refills | Status: DC

## 2023-10-03 MED ORDER — DIVALPROEX SODIUM ER 500 MG PO TB24: 500.0000 mg | ORAL_TABLET | Freq: Every day | ORAL | 6 refills | Status: DC

## 2023-10-03 NOTE — Progress Notes (Signed)
 Psychiatric Initial Adult Assessment   Patient Identification: Jackie Jones MRN:  [id number] Date of Evaluation:  10/03/2023 Referral Source: [facility] Chief Complaint: Depression Visit Diagnosis:   Today the patient is doing very well.  She lives at [facility] in independent living setting.  Today she is a little upset about her son who recently had some of his food benefits removed.  He lives in [place]  and is disabled.  Fortunately has a roommate and they get along well and they seem to take care of each other.  Patient patient's diagnosis is bipolar disorder and she takes Depakote  and is very stable.  She is sleeping and eating well has good energy.  She has no signs of grandiosity.  She is not impulsive.  She is not euphoric or irritable.  She is functions extremely well.  Financially she is stable.  Her sister and her niece lived [place] for help her a great deal.  They get her medicines from the pharmacy and the patient takes them on her own.  Patient is very independent as all her basic ADLs and institution ADLs.   ICD-10-CM   1. Bipolar 1 disorder, depressed (HCC)  F31.9 divalproex  (DEPAKOTE  ER) 500 MG 24 hr tablet      History of Present Illness:    Associated Signs/Symptoms: Depression Symptoms:   (Hypo) Manic Symptoms:   Anxiety Symptoms:   Psychotic Symptoms:   PTSD Symptoms: NA  Past Psychiatric History: 2 psychiatric hospitalizations.  Previous Psychotropic Medications: Yes   Substance Abuse History in the last 12 months:  No.  Consequences of Substance Abuse: NA  Past Medical History:  Past Medical History:  Diagnosis Date   Allergy    Anxiety 2000   Arthritis    Bipolar 1 disorder, depressed (HCC)    Bipolar disorder (HCC)    Chicken pox    Dementia (HCC)    Depression    Dysrhythmia 05/10/2020   pt states cardiologist told me it is not dangerous, it's an anomaly   Hashimoto's thyroiditis    History of blood transfusion     History of bone density study 2019   History of colonoscopy 2015   History of CT scan 2019   History of mammogram 2019   History of MRI    History of Papanicolaou smear of cervix    History of right hip replacement    Hx: UTI (urinary tract infection)    Hypertension    Hypothyroidism    Lactose intolerance    Legally blind in left eye, as defined in [place]     Non-celiac gluten sensitivity    Osteoporosis    Pernicious anemia    Pernicious anemia    Rheumatic fever    Thyroid  disease    Urinary and fecal incontinence     Past Surgical History:  Procedure Laterality Date   ABDOMINAL HYSTERECTOMY     1981   APPENDECTOMY  1981   EYE SURGERY Bilateral 2014   cataracts   LEFT HEART CATH AND CORONARY ANGIOGRAPHY N/A 05/10/2020   Procedure: LEFT HEART CATH AND CORONARY ANGIOGRAPHY;  Surgeon: Mady Bruckner, MD;  [facility];  Service: Cardiovascular;  Laterality: N/A;   prolapsed rectum 2015     TONSILLECTOMY AND ADENOIDECTOMY  1966   TOTAL HIP ARTHROPLASTY Right 11/23/2020   Procedure: RIGHT TOTAL HIP ARTHROPLASTY ANTERIOR APPROACH;  Surgeon: Vernetta Bruckner GRADE, MD;  Location: [facility] OR;  Service: Orthopedics;  Laterality: Right;   TUBAL LIGATION  Family Psychiatric History:   Family History:  Family History  Problem Relation Age of Onset   Heart disease Mother    Arthritis Mother    Cancer Father        bladder   Thyroid  disease Father    Dementia Father    Hypertension Sister    Hypertension Brother    Hypertension Sister    Hypertension Sister    Diabetes Brother    Diabetes type II Son     Social History:   Social History   Socioeconomic History   Marital status: Single    Spouse name: Not on file   Number of children: 1   Years of education: Not on file   Highest education level: Master's degree (e.g., MA, MS, MEng, MEd, MSW, MBA)  Occupational History   Not on file  Tobacco Use   Smoking status: Never   Smokeless tobacco: Never   Vaping Use   Vaping status: Never Used  Substance and Sexual Activity   Alcohol  use: Never   Drug use: Never   Sexual activity: Not Currently  Other Topics Concern   Not on file  Social History Narrative   Tobacco use, amount per day now: None.   Past tobacco use, amount per day: None.   How many years did you use tobacco: None.   Alcohol  use (drinks per week): None.   Diet: I eat a low fat diet. Much in vegetables.    Do you drink/eat things with caffeine: Yes.   Marital status:  Divorced                                What year were you married? 1976   Do you live in a house, apartment, assisted living, condo, trailer, etc.? Rent small house.   Is it one or more stories? One.   How many persons live in your home? I live alone.    Do you have pets in your home?( please list) No.   Highest Level of education completed? Masters Degree Plus.   Current or past profession: Runner, broadcasting/film/video.   Do you exercise? Yes.                                  Type and how often? 5 days a week I walk.    Do you have a living will? Yes.   Do you have a DNR form?    Yes                               If not, do you want to discuss one?   Do you have signed POA/HPOA forms?  Yes.                      If so, please bring to you appointment      Do you have any difficulty bathing or dressing yourself? No.   Do you have any difficulty preparing food or eating? No.   Do you have any difficulty managing your medications? No.   Do you have any difficulty managing your finances? Yes.   Do you have any difficulty affording your medications? No.   Social Drivers of Health   Financial Resource Strain: Medium Risk (09/30/2023)   Overall Financial Resource Strain (CARDIA)  Difficulty of Paying Living Expenses: Somewhat hard  Food Insecurity: No Food Insecurity (09/30/2023)   Hunger Vital Sign    Worried About Running Out of Food in the Last Year: Never true    Ran Out of Food in the Last Year: Never true   Transportation Needs: No Transportation Needs (09/30/2023)   PRAPARE - Administrator, Civil Service (Medical): No    Lack of Transportation (Non-Medical): No  Physical Activity: Sufficiently Active (09/30/2023)   Exercise Vital Sign    Days of Exercise per Week: 5 days    Minutes of Exercise per Session: 60 min  Stress: No Stress Concern Present (09/30/2023)   [employer] of Occupational Health - Occupational Stress Questionnaire    Feeling of Stress: Not at all  Social Connections: Moderately Integrated (09/30/2023)   Social Connection and Isolation Panel    Frequency of Communication with Friends and Family: More than three times a week    Frequency of Social Gatherings with Friends and Family: More than three times a week    Attends Religious Services: More than 4 times per year    Active Member of [employer] or Organizations: Yes    Attends Engineer, structural: More than 4 times per year    Marital Status: Divorced    Additional Social History:   Allergies:   Allergies  Allergen Reactions   Contrast Media [Iodinated Contrast Media] Itching   Trintellix  [Vortioxetine ] Other (See Comments)    Led to mania   Dairycare [Bacid]    Doxycycline  Hyclate Nausea And Vomiting    Double vision   Soy Allergy (Obsolete) Itching   Ciprofloxacin Rash   Levaquin [Levofloxacin] Rash   Penicillins Rash    Metabolic Disorder Labs: No results found for: HGBA1C, MPG No results found for: PROLACTIN Lab Results  Component Value Date   CHOL 165 09/25/2018   TRIG 56.0 09/25/2018   HDL 74.50 09/25/2018   CHOLHDL 2 09/25/2018   VLDL 11.2 09/25/2018   LDLCALC 79 09/25/2018   Lab Results  Component Value Date   TSH 1.54 07/05/2023    Therapeutic Level Labs: No results found for: LITHIUM No results found for: CBMZ Lab Results  Component Value Date   VALPROATE 34.1 (L) 10/26/2022    Current Medications: Current Outpatient Medications  Medication Sig  Dispense Refill   acetaminophen  (TYLENOL ) 500 MG tablet Take 1,000 mg by mouth at bedtime.     alendronate  (FOSAMAX ) 70 MG tablet TAKE 1 TABLET(70 MG) BY MOUTH 1 TIME A WEEK WITH A FULL GLASS OF WATER AND ON AN EMPTY STOMACH 12 tablet 3   amLODipine  (NORVASC ) 5 MG tablet Take 1 tablet (5 mg total) by mouth daily. 90 tablet 2   aspirin  EC 81 MG tablet Take 81 mg by mouth daily. Swallow whole.     Calcium -Vitamin D -Vitamin K (VIACTIV CALCIUM  PLUS D) 650-12.5-40 MG-MCG-MCG CHEW Chew 2 tablets by mouth daily.     COVID-19 mRNA vaccine 2023-2024 (COMIRNATY ) syringe Inject into the muscle. 0.3 mL 0   cyanocobalamin  (VITAMIN B12) 1000 MCG/ML injection INJECT 1 ML INTRAMUSCULARLY  ONCE EVERY MONTH 3 mL 2   Emollient (AVEENO RESTORATIVE SKIN THERAP) CREA Apply 1 application topically daily.     halobetasol (ULTRAVATE) 0.05 % ointment Apply 1 application topically daily as needed (eczema).     ipratropium (ATROVENT ) 0.03 % nasal spray Place 2 sprays into both nostrils every 12 (twelve) hours. 30 mL 3   levothyroxine  (SYNTHROID ) 88 MCG tablet Take  1 tablet (88 mcg total) by mouth daily. 90 tablet 3   loratadine  (CLARITIN ) 10 MG tablet Take 10 mg by mouth daily.     meclizine  (ANTIVERT ) 50 MG tablet Take 1 tablet (50 mg total) by mouth every 6 (six) hours as needed. 30 tablet 0   Multiple Vitamins-Minerals (CENTRUM SILVER ADULT 50+ PO) Take 1 tablet by mouth daily.     Multiple Vitamins-Minerals (PRESERVISION AREDS) CAPS Take 1 capsule by mouth in the morning and at bedtime.     olmesartan  (BENICAR ) 20 MG tablet Take 1 tablet by mouth once daily 90 tablet 0   SYRINGE-NEEDLE, DISP, 3 ML (BD SAFETYGLIDE SYRINGE/NEEDLE) 25G X 1 3 ML MISC Inject 1ml in deltoid once monthly 100 each 11   Wheat Dextrin (BENEFIBER PO) Take by mouth. Take one teaspoon daily     divalproex  (DEPAKOTE  ER) 500 MG 24 hr tablet Take 1 tablet (500 mg total) by mouth at bedtime. 90 tablet 6   venlafaxine  XR (EFFEXOR -XR) 75 MG 24 hr capsule  1 qam 90 capsule 3   No current facility-administered medications for this visit.    Musculoskeletal: Strength & Muscle Tone: within normal limits Gait & Station: unsteady Patient leans: N/A  Psychiatric Specialty Exam: Review of Systems  Blood pressure 122/70, pulse (!) 106, height 5' 4 (1.626 m), weight 147 lb (66.7 kg).Body mass index is 25.23 kg/m.  General Appearance: Fairly Groomed  Eye Contact:  Good  Speech:  Normal Rate  Volume:  Normal  Mood:  NA  Affect:  Appropriate  Thought Process:  Goal Directed  Orientation:  Full (Time, Place, and Person)  Thought Content:  WDL  Suicidal Thoughts:  No  Homicidal Thoughts:  No  Memory:  NA  Judgement:  Good  Insight:  Good  Psychomotor Activity:  Normal  Concentration:    Recall:  Good  Fund of Knowledge:Good  Language: Good  Akathisia:  No  Handed:  Right  AIMS (if indicated):  not done  Assets:  Desire for Improvement  ADL's:  Intact  Cognition: WNL  Sleep:  Good   Screenings: Mini-Mental    Flowsheet Row Office Visit from 10/20/2021 in [facility] & Adult [facility] Office Visit from 05/28/2020 in [facility]  Total Score (max 30 points ) 30 27   PHQ2-9    Flowsheet Row Office Visit from 05/31/2023 in [facility] Adult Medicine Erroneous Encounter from 07/06/2022 in [facility] Office Visit from 06/30/2021 in [facility] Clinical Support from 10/07/2019 in [facility] [place] Office Visit from 10/30/2018 in [facility] [place]  PHQ-2 Total Score 0 0 0 0 0  PHQ-9 Total Score -- -- -- 0 0   Flowsheet Row ED from 05/23/2023 in [facility] at [facility] Admission (Discharged) from 11/23/2020 in [facility]  [facility] Pre-Admission Testing 60 from 11/15/2020 in [facility]  C-SSRS RISK CATEGORY No Risk No Risk No Risk    Assessment and Plan:   Today the patient diagnosis is bipolar disorder in remission.  She will continue taking Depakote  500 mg a day.  Get her blood level of 35 and for now we will leave it where it is.  Probably in the next 6 months we will go ahead and get some blood  work including a repeat Depakote  level.  She continues taking Effexor  as well.  The combination of Depakote  and Effexor  worked very well for her.  The patient's mood is very stable.  She will return to see me in 4 months.  Collaboration of Care:   Patient/Guardian was advised Release of Information must be obtained prior to any record release in order to collaborate their care with an outside provider. Patient/Guardian was advised if they have not already done so to contact the registration department to sign all necessary forms in order for us  to release information regarding their care.   Consent: Patient/Guardian gives verbal consent for treatment and assignment of benefits for services provided during this visit. Patient/Guardian expressed understanding and agreed to proceed.   Elna LILLETTE Lo, MD 7/16/20252:29 PM

## 2023-10-04 ENCOUNTER — Encounter: Payer: Self-pay | Admitting: Internal Medicine

## 2023-10-04 ENCOUNTER — Ambulatory Visit (INDEPENDENT_AMBULATORY_CARE_PROVIDER_SITE_OTHER): Admitting: Internal Medicine

## 2023-10-04 VITALS — BP 110/70 | HR 94 | Temp 98.9°F | Ht 64.0 in | Wt 146.0 lb

## 2023-10-04 DIAGNOSIS — F319 Bipolar disorder, unspecified: Secondary | ICD-10-CM | POA: Diagnosis not present

## 2023-10-04 DIAGNOSIS — E063 Autoimmune thyroiditis: Secondary | ICD-10-CM | POA: Diagnosis not present

## 2023-10-04 DIAGNOSIS — M810 Age-related osteoporosis without current pathological fracture: Secondary | ICD-10-CM | POA: Diagnosis not present

## 2023-10-04 DIAGNOSIS — I10 Essential (primary) hypertension: Secondary | ICD-10-CM | POA: Diagnosis not present

## 2023-10-04 MED ORDER — ALENDRONATE SODIUM 70 MG PO TABS: ORAL_TABLET | ORAL | 3 refills | Status: AC

## 2023-10-04 NOTE — Assessment & Plan Note (Signed)
 Mood is stable, followed by psychiatry on Depakote .

## 2023-10-04 NOTE — Assessment & Plan Note (Signed)
 On alendronate , sent in refills today.

## 2023-10-04 NOTE — Assessment & Plan Note (Signed)
 Blood pressure is well-controlled on current.

## 2023-10-04 NOTE — Assessment & Plan Note (Signed)
 Recent TSH within normal limits on stable dose of levothyroxine .

## 2023-10-04 NOTE — Progress Notes (Signed)
 New Patient Office Visit     CC/Reason for Visit: Establish care, medication refills, discuss chronic conditions Previous PCP: [facility] Last Visit: March 2025  HPI: Jackie Jones is a 78 y.o. female who is coming in today for the above mentioned reasons. Past Medical History is significant for: Bipolar disorder followed by psychiatry with stable mood on Depakote , osteoporosis on alendronate , hypertension and hypothyroidism.  She has been feeling well, no acute concerns or complaints.   Past Medical/Surgical History: Past Medical History:  Diagnosis Date   Allergy    Anxiety 2000   Arthritis    Bipolar 1 disorder, depressed (HCC)    Bipolar disorder (HCC)    Chicken pox    Dementia (HCC)    Depression    Dysrhythmia 05/10/2020   pt states cardiologist told me it is not dangerous, it's an anomaly   Hashimoto's thyroiditis    History of blood transfusion    History of bone density study 2019   History of colonoscopy 2015   History of CT scan 2019   History of mammogram 2019   History of MRI    History of Papanicolaou smear of cervix    History of right hip replacement    Hx: UTI (urinary tract infection)    Hypertension    Hypothyroidism    Lactose intolerance    Legally blind in left eye, as defined in [place]     Non-celiac gluten sensitivity    Osteoporosis    Pernicious anemia    Pernicious anemia    Rheumatic fever    Thyroid  disease    Urinary and fecal incontinence     Past Surgical History:  Procedure Laterality Date   ABDOMINAL HYSTERECTOMY     1981   APPENDECTOMY  1981   EYE SURGERY Bilateral 2014   cataracts   LEFT HEART CATH AND CORONARY ANGIOGRAPHY N/A 05/10/2020   Procedure: LEFT HEART CATH AND CORONARY ANGIOGRAPHY;  Surgeon: Mady Bruckner, MD;  [facility];  Service: Cardiovascular;  Laterality: N/A;   prolapsed rectum 2015     TONSILLECTOMY AND ADENOIDECTOMY  1966   TOTAL HIP ARTHROPLASTY Right 11/23/2020    Procedure: RIGHT TOTAL HIP ARTHROPLASTY ANTERIOR APPROACH;  Surgeon: Vernetta Bruckner GRADE, MD;  Location: [facility] OR;  Service: Orthopedics;  Laterality: Right;   TUBAL LIGATION      Social History:  reports that she has never smoked. She has never used smokeless tobacco. She reports that she does not drink alcohol  and does not use drugs.  Allergies: Allergies  Allergen Reactions   Contrast Media [Iodinated Contrast Media] Itching   Trintellix  [Vortioxetine ] Other (See Comments)    Led to mania   Dairycare [Bacid]    Doxycycline  Hyclate Nausea And Vomiting    Double vision   Soy Allergy (Obsolete) Itching   Ciprofloxacin Rash   Levaquin [Levofloxacin] Rash   Penicillins Rash    Family History:  Family History  Problem Relation Age of Onset   Heart disease Mother    Arthritis Mother    Cancer Father        bladder   Thyroid  disease Father    Dementia Father    Hypertension Sister    Hypertension Brother    Hypertension Sister    Hypertension Sister    Diabetes Brother    Diabetes type II Son      Current Outpatient Medications:    acetaminophen  (TYLENOL ) 500 MG tablet, Take 1,000 mg by mouth at  bedtime., Disp: , Rfl:    amLODipine  (NORVASC ) 5 MG tablet, Take 1 tablet (5 mg total) by mouth daily., Disp: 90 tablet, Rfl: 2   aspirin  EC 81 MG tablet, Take 81 mg by mouth daily. Swallow whole., Disp: , Rfl:    Calcium -Vitamin D -Vitamin K (VIACTIV CALCIUM  PLUS D) 650-12.5-40 MG-MCG-MCG CHEW, Chew 2 tablets by mouth daily., Disp: , Rfl:    cetirizine (ZYRTEC) 10 MG tablet, Take 10 mg by mouth daily., Disp: , Rfl:    cyanocobalamin  (VITAMIN B12) 1000 MCG/ML injection, INJECT 1 ML INTRAMUSCULARLY  ONCE EVERY MONTH, Disp: 3 mL, Rfl: 2   divalproex  (DEPAKOTE  ER) 500 MG 24 hr tablet, Take 1 tablet (500 mg total) by mouth at bedtime., Disp: 90 tablet, Rfl: 6   halobetasol (ULTRAVATE) 0.05 % ointment, Apply 1 application topically daily as needed (eczema)., Disp: , Rfl:    ipratropium  (ATROVENT ) 0.03 % nasal spray, Place 2 sprays into both nostrils every 12 (twelve) hours., Disp: 30 mL, Rfl: 3   levothyroxine  (SYNTHROID ) 88 MCG tablet, Take 1 tablet (88 mcg total) by mouth daily., Disp: 90 tablet, Rfl: 3   loratadine  (CLARITIN ) 10 MG tablet, Take 10 mg by mouth daily., Disp: , Rfl:    Multiple Vitamins-Minerals (CENTRUM SILVER ADULT 50+ PO), Take 1 tablet by mouth daily., Disp: , Rfl:    Multiple Vitamins-Minerals (PRESERVISION AREDS) CAPS, Take 1 capsule by mouth in the morning and at bedtime., Disp: , Rfl:    olmesartan  (BENICAR ) 20 MG tablet, Take 1 tablet by mouth once daily, Disp: 90 tablet, Rfl: 0   psyllium (METAMUCIL) 58.6 % packet, Take 1 packet by mouth daily., Disp: , Rfl:    SYRINGE-NEEDLE, DISP, 3 ML (BD SAFETYGLIDE SYRINGE/NEEDLE) 25G X 1 3 ML MISC, Inject 1ml in deltoid once monthly, Disp: 100 each, Rfl: 11   venlafaxine  XR (EFFEXOR -XR) 75 MG 24 hr capsule, 1 qam, Disp: 90 capsule, Rfl: 3   Wheat Dextrin (BENEFIBER PO), Take by mouth. Take one teaspoon daily, Disp: , Rfl:    alendronate  (FOSAMAX ) 70 MG tablet, TAKE 1 TABLET(70 MG) BY MOUTH 1 TIME A WEEK WITH A FULL GLASS OF WATER AND ON AN EMPTY STOMACH, Disp: 12 tablet, Rfl: 3   COVID-19 mRNA vaccine 2023-2024 (COMIRNATY ) syringe, Inject into the muscle., Disp: 0.3 mL, Rfl: 0   Emollient (AVEENO RESTORATIVE SKIN THERAP) CREA, Apply 1 application topically daily., Disp: , Rfl:    meclizine  (ANTIVERT ) 50 MG tablet, Take 1 tablet (50 mg total) by mouth every 6 (six) hours as needed., Disp: 30 tablet, Rfl: 0  Review of Systems:  Negative except as indicated in HPI.   Physical Exam: Vitals:   10/04/23 1350  BP: 110/70  Pulse: 94  Temp: 98.9 F (37.2 C)  TempSrc: Oral  SpO2: 98%  Weight: 146 lb (66.2 kg)  Height: 5' 4 (1.626 m)   Body mass index is 25.06 kg/m.  Physical Exam Vitals reviewed.  Constitutional:      Appearance: Normal appearance.  HENT:     Head: Normocephalic and atraumatic.   Eyes:     Conjunctiva/sclera: Conjunctivae normal.  Cardiovascular:     Rate and Rhythm: Normal rate and regular rhythm.  Pulmonary:     Effort: Pulmonary effort is normal.     Breath sounds: Normal breath sounds.  Skin:    General: Skin is warm and dry.  Neurological:     Mental Status: She is alert and oriented to person, place, and time.  Psychiatric:  Mood and Affect: Mood normal.        Behavior: Behavior normal.        Thought Content: Thought content normal.        Judgment: Judgment normal.       Impression and Plan:  Bipolar 1 disorder, depressed (HCC) Assessment & Plan: Mood is stable, followed by psychiatry on Depakote .   Age-related osteoporosis without current pathological fracture Assessment & Plan: On alendronate , sent in refills today.  Orders: -     Alendronate  Sodium; TAKE 1 TABLET(70 MG) BY MOUTH 1 TIME A WEEK WITH A FULL GLASS OF WATER AND ON AN EMPTY STOMACH  Dispense: 12 tablet; Refill: 3  Primary hypertension Assessment & Plan: Blood pressure is well-controlled on current.   Hypothyroidism due to Hashimoto thyroiditis Assessment & Plan: Recent TSH within normal limits on stable dose of levothyroxine .     Time spent: 46 minutes reviewing chart, interviewing and examining patient and formulating plan of care.     Tully Theophilus [place], MD [facility]

## 2023-11-08 ENCOUNTER — Ambulatory Visit: Payer: Medicare Other | Admitting: Orthopedic Surgery

## 2023-11-22 ENCOUNTER — Other Ambulatory Visit: Payer: Self-pay | Admitting: Orthopedic Surgery

## 2023-11-22 DIAGNOSIS — I10 Essential (primary) hypertension: Secondary | ICD-10-CM

## 2023-11-26 ENCOUNTER — Telehealth: Payer: Self-pay | Admitting: *Deleted

## 2023-11-26 DIAGNOSIS — I10 Essential (primary) hypertension: Secondary | ICD-10-CM

## 2023-11-26 MED ORDER — OLMESARTAN MEDOXOMIL 20 MG PO TABS: 20.0000 mg | ORAL_TABLET | Freq: Every day | ORAL | 1 refills | Status: DC

## 2023-11-26 NOTE — Telephone Encounter (Signed)
 Copied from CRM [id number]. Topic: Clinical - Medication Question >> Nov 23, 2023 11:50 AM Burnard DEL wrote: Reason for CRM: Patient  no longer seeing prescribed provider for her  olmesartan  (BENICAR ) 20 MG tablet medication. She would like to know Dr Theophilus could represcribe this medication for her.

## 2023-11-26 NOTE — Telephone Encounter (Signed)
 Refill was sent

## 2023-11-29 ENCOUNTER — Telehealth: Payer: Self-pay | Admitting: Internal Medicine

## 2023-11-29 DIAGNOSIS — Z23 Encounter for immunization: Secondary | ICD-10-CM

## 2023-11-29 MED ORDER — COVID-19 MRNA VACC (MODERNA) 50 MCG/0.5ML IM SUSY: 0.5000 mL | PREFILLED_SYRINGE | Freq: Once | INTRAMUSCULAR | 0 refills | Status: DC

## 2023-11-29 MED ORDER — COVID-19 MRNA VACC (MODERNA) 50 MCG/0.5ML IM SUSY: 0.5000 mL | PREFILLED_SYRINGE | Freq: Once | INTRAMUSCULAR | 0 refills | Status: AC

## 2023-11-29 NOTE — Telephone Encounter (Signed)
 Rx sent.

## 2023-11-29 NOTE — Telephone Encounter (Signed)
 Copied from CRM #[id number]. Topic: General - Other >> Nov 29, 2023  3:09 PM Paige D wrote: Reason for CRM: pt wants covid vaccine [place])  prescription sent over to pharmacy.   [facility] [address] [place], [place], [place] [address] [phone]

## 2023-11-30 ENCOUNTER — Emergency Department (HOSPITAL_COMMUNITY)
Admission: EM | Admit: 2023-11-30 | Discharge: 2023-11-30 | Disposition: A | Discharge status: Home / Self Care | Attending: Emergency Medicine | Admitting: Emergency Medicine

## 2023-11-30 ENCOUNTER — Other Ambulatory Visit: Payer: Self-pay

## 2023-11-30 DIAGNOSIS — I10 Essential (primary) hypertension: Secondary | ICD-10-CM | POA: Insufficient documentation

## 2023-11-30 DIAGNOSIS — Z79899 Other long term (current) drug therapy: Secondary | ICD-10-CM | POA: Diagnosis not present

## 2023-11-30 DIAGNOSIS — R1030 Lower abdominal pain, unspecified: Secondary | ICD-10-CM | POA: Diagnosis present

## 2023-11-30 DIAGNOSIS — K625 Hemorrhage of anus and rectum: Secondary | ICD-10-CM | POA: Insufficient documentation

## 2023-11-30 DIAGNOSIS — I2510 Atherosclerotic heart disease of native coronary artery without angina pectoris: Secondary | ICD-10-CM | POA: Insufficient documentation

## 2023-11-30 DIAGNOSIS — K921 Melena: Secondary | ICD-10-CM

## 2023-11-30 DIAGNOSIS — Z7982 Long term (current) use of aspirin: Secondary | ICD-10-CM | POA: Diagnosis not present

## 2023-11-30 LAB — CBC
HCT: 36 % (ref 36.0–46.0)
Hemoglobin: 12.1 g/dL (ref 12.0–15.0)
MCH: 29.5 pg (ref 26.0–34.0)
MCHC: 33.6 g/dL (ref 30.0–36.0)
MCV: 87.8 fL (ref 80.0–100.0)
Platelets: 300 K/uL (ref 150–400)
RBC: 4.1 MIL/uL (ref 3.87–5.11)
RDW: 13.4 % (ref 11.5–15.5)
WBC: 10.2 K/uL (ref 4.0–10.5)
nRBC: 0 % (ref 0.0–0.2)

## 2023-11-30 LAB — RESP PANEL BY RT-PCR (RSV, FLU A&B, COVID)  RVPGX2
Influenza A by PCR: NEGATIVE
Influenza B by PCR: NEGATIVE
Resp Syncytial Virus by PCR: NEGATIVE
SARS Coronavirus 2 by RT PCR: NEGATIVE

## 2023-11-30 LAB — TYPE AND SCREEN
ABO/RH(D): O POS
Antibody Screen: NEGATIVE

## 2023-11-30 LAB — I-STAT CHEM 8, ED
BUN: 31 mg/dL — ABNORMAL HIGH (ref 8–23)
Calcium, Ion: 1.15 mmol/L (ref 1.15–1.40)
Chloride: 99 mmol/L (ref 98–111)
Creatinine, Ser: 0.9 mg/dL (ref 0.44–1.00)
Glucose, Bld: 110 mg/dL — ABNORMAL HIGH (ref 70–99)
HCT: 37 % (ref 36.0–46.0)
Hemoglobin: 12.6 g/dL (ref 12.0–15.0)
Potassium: 4.3 mmol/L (ref 3.5–5.1)
Sodium: 132 mmol/L — ABNORMAL LOW (ref 135–145)
TCO2: 22 mmol/L (ref 22–32)

## 2023-11-30 LAB — COMPREHENSIVE METABOLIC PANEL WITH GFR
ALT: 17 U/L (ref 0–44)
AST: 25 U/L (ref 15–41)
Albumin: 3.8 g/dL (ref 3.5–5.0)
Alkaline Phosphatase: 52 U/L (ref 38–126)
Anion gap: 14 (ref 5–15)
BUN: 30 mg/dL — ABNORMAL HIGH (ref 8–23)
CO2: 20 mmol/L — ABNORMAL LOW (ref 22–32)
Calcium: 9.3 mg/dL (ref 8.9–10.3)
Chloride: 96 mmol/L — ABNORMAL LOW (ref 98–111)
Creatinine, Ser: 0.95 mg/dL (ref 0.44–1.00)
GFR, Estimated: >60 mL/min (ref >60)
Glucose, Bld: 111 mg/dL — ABNORMAL HIGH (ref 70–99)
Potassium: 4.3 mmol/L (ref 3.5–5.1)
Sodium: 130 mmol/L — ABNORMAL LOW (ref 135–145)
Total Bilirubin: 0.6 mg/dL (ref 0.0–1.2)
Total Protein: 7.1 g/dL (ref 6.5–8.1)

## 2023-11-30 LAB — POC OCCULT BLOOD, ED: Fecal Occult Bld: POSITIVE — AB

## 2023-11-30 MED ORDER — SODIUM CHLORIDE 0.9 % IV BOLUS
1000.0000 mL | Freq: Once | INTRAVENOUS | Status: AC
  Administered 2023-11-30: 1000 mL via INTRAVENOUS

## 2023-11-30 NOTE — ED Triage Notes (Signed)
 Pt here from home with c/o rectal bleeding , no blood thinners started yesterday evening around 5 pm

## 2023-11-30 NOTE — Discharge Instructions (Signed)
 You were seen in the emergency room for gastrointestinal bleed.  Your vitals and labs were normal.  We recommend that you call the gastroenterology office to set up an appointment for next week.  If you have ongoing bleeding, dizziness, lightheadedness, chest pain, or shortness of breath, please seek medical attention.

## 2023-11-30 NOTE — ED Provider Notes (Signed)
 [facility] AT [facility] Provider Note   CSN: [id number] Arrival date & time: 11/30/23  9256     Patient presents with: Rectal Bleeding and Abdominal Pain   Jackie Jones is a 78 y.o. female.   Patient states she was in her usual state of health yesterday evening until she had an episode of diarrhea followed by the onset of bright red blood per rectum around 8 PM.  States she was bleeding from her bottom all throughout the night and presented to the ED this morning.  States she had some lower abdominal pain but is not having pain currently.  Has not eaten or had much to drink since her symptoms started.  Had colonoscopies up until age 33, states these were all unremarkable.  Denies falls, lightheadedness/dizziness, chest pain, shortness of breath, nausea, vomiting, dysuria, other abdominal pain, reflux, sick contacts.  Denies history of GI bleed, cancer, family history GI cancer. Takes a baby aspirin  daily, otherwise no anticoagulants. Took all her a.m. meds today.     Rectal Bleeding Associated symptoms: abdominal pain   Abdominal Pain Associated symptoms: hematochezia        Prior to Admission medications   Medication Sig Start Date End Date Taking? Authorizing Provider  acetaminophen  (TYLENOL ) 500 MG tablet Take 1,000 mg by mouth at bedtime.    [provider]  alendronate  (FOSAMAX ) 70 MG tablet TAKE 1 TABLET(70 MG) BY MOUTH 1 TIME A WEEK WITH A FULL GLASS OF WATER AND ON AN EMPTY STOMACH 10/04/23   Theophilus Andrews, Tully GRADE, MD  amLODipine  (NORVASC ) 5 MG tablet Take 1 tablet (5 mg total) by mouth daily. 05/31/23   Fargo, Amy E, NP  aspirin  EC 81 MG tablet Take 81 mg by mouth daily. Swallow whole.    [provider]  Calcium -Vitamin D -Vitamin K (VIACTIV CALCIUM  PLUS D) 650-12.5-40 MG-MCG-MCG CHEW Chew 2 tablets by mouth daily.    [provider]  cetirizine (ZYRTEC) 10 MG tablet Take 10 mg by mouth daily.    [provider]  cyanocobalamin  (VITAMIN B12) 1000 MCG/ML injection INJECT 1 ML INTRAMUSCULARLY  ONCE EVERY MONTH 05/07/23   Medina-Vargas, Monina C, NP  divalproex  (DEPAKOTE  ER) 500 MG 24 hr tablet Take 1 tablet (500 mg total) by mouth at bedtime. 10/03/23   Plovsky, Elna, MD  Emollient (AVEENO RESTORATIVE SKIN THERAP) CREA Apply 1 application topically daily.    [provider]  halobetasol (ULTRAVATE) 0.05 % ointment Apply 1 application topically daily as needed (eczema). 07/19/20   [provider]  ipratropium (ATROVENT ) 0.03 % nasal spray Place 2 sprays into both nostrils every 12 (twelve) hours. 04/27/22   Caro Harlene POUR, NP  levothyroxine  (SYNTHROID ) 88 MCG tablet Take 1 tablet (88 mcg total) by mouth daily. 10/27/22   Fargo, Amy E, NP  loratadine  (CLARITIN ) 10 MG tablet Take 10 mg by mouth daily.    [provider]  meclizine  (ANTIVERT ) 50 MG tablet Take 1 tablet (50 mg total) by mouth every 6 (six) hours as needed. 03/16/22   Landy Barnie GORMAN, NP  Multiple Vitamins-Minerals (CENTRUM SILVER ADULT 50+ PO) Take 1 tablet by mouth daily.    [provider]  Multiple Vitamins-Minerals (PRESERVISION AREDS) CAPS Take 1 capsule by mouth in the morning and at bedtime.    [provider]  olmesartan  (BENICAR ) 20 MG tablet Take 1 tablet (20 mg total) by mouth daily. 11/26/23   Theophilus Andrews, Tully GRADE, MD  psyllium (METAMUCIL)  58.6 % packet Take 1 packet by mouth daily.    [provider]  SYRINGE-NEEDLE, DISP, 3 ML (BD SAFETYGLIDE SYRINGE/NEEDLE) 25G X 1 3 ML MISC Inject 1ml in deltoid once monthly 01/06/21   Fargo, Amy E, NP  venlafaxine  XR (EFFEXOR -XR) 75 MG 24 hr capsule 1 qam 10/03/23   Plovsky, Elna, MD  Wheat Dextrin (BENEFIBER PO) Take by mouth. Take one teaspoon daily    [provider]    Allergies: Contrast media [iodinated contrast media], Trintellix  [vortioxetine ], Dairycare [bacid], Doxycycline  hyclate, Soy allergy (obsolete),  Ciprofloxacin, Levaquin [levofloxacin], and Penicillins    Review of Systems  Gastrointestinal:  Positive for abdominal pain and hematochezia.    Updated Vital Signs BP 121/62 (BP Location: Left Arm)   Pulse 89   Temp 98.3 F (36.8 C)   Resp 18   Ht 5' 4 (1.626 m)   Wt 63.5 kg   SpO2 100%   BMI 24.03 kg/m   Physical Exam Vitals and nursing note reviewed. Exam conducted with a chaperone present.  Constitutional:      General: She is not in acute distress.    Appearance: She is not ill-appearing.  HENT:     Head: Normocephalic and atraumatic.     Mouth/Throat:     Mouth: Mucous membranes are moist.  Eyes:     Extraocular Movements: Extraocular movements intact.  Cardiovascular:     Rate and Rhythm: Normal rate and regular rhythm.     Heart sounds: Normal heart sounds.  Pulmonary:     Effort: Pulmonary effort is normal.     Breath sounds: Normal breath sounds.  Abdominal:     General: Abdomen is flat. Bowel sounds are normal. There is no distension.     Palpations: Abdomen is soft.     Tenderness: There is no abdominal tenderness. There is no guarding or rebound.  Genitourinary:    Rectum: No mass or tenderness.     Comments: No active bleeding on rectal exam.  Dried bright red blood in diaper.  No external hemorrhoids visualized Skin:    General: Skin is warm and dry.  Neurological:     General: No focal deficit present.     Mental Status: She is alert.  Psychiatric:        Mood and Affect: Mood normal.        Behavior: Behavior normal.     (all labs ordered are listed, but only abnormal results are displayed) Labs Reviewed  COMPREHENSIVE METABOLIC PANEL WITH GFR - Abnormal; Notable for the following components:      Result Value   Sodium 130 (*)    Chloride 96 (*)    CO2 20 (*)    Glucose, Bld 111 (*)    BUN 30 (*)    All other components within normal limits  I-STAT CHEM 8, ED - Abnormal; Notable for the following components:   Sodium 132 (*)    BUN  31 (*)    Glucose, Bld 110 (*)    All other components within normal limits  CBC  POC OCCULT BLOOD, ED  TYPE AND SCREEN    EKG: None  Radiology: No results found.   Procedures   Medications Ordered in the ED - No data to display                                  Medical Decision Making 78 year old female with history  of CAD, HTN who presents with red blood per rectum starting last night.  Differential includes internal hemorrhoid or fissure, diverticulosis, neoplastic process. Patient is hemodynamically stable with hemoglobin 12.6 (up from 12.1 on initial lab work) and no lab abnormalities otherwise.  Rectal exam without active bleeding, will send FOBT.  Giving 1 L NS bolus. Viral resp panel negative.  Given overall stability, advised patient to discharge home with instructions to call GI office for appointment next week.  Reviewed return precautions.  Patient agreeable with plan.    Amount and/or Complexity of Data Reviewed Labs: ordered.       Final diagnoses:  None    ED Discharge Orders     None          Adele Song, MD 11/30/23 1918    Bernard Drivers, MD 11/30/23 2214

## 2023-11-30 NOTE — ED Triage Notes (Signed)
 Pt. Stated, I started off with constipation and then diarrhea. I had bright red blood coming from my rectum since yesterday evening

## 2023-11-30 NOTE — ED Notes (Addendum)
 Pt from [facility] home, her niece will call her an uber to come and pick her up. Pt able to ambulate with cane

## 2023-12-08 DIAGNOSIS — Z23 Encounter for immunization: Secondary | ICD-10-CM | POA: Diagnosis not present

## 2023-12-20 DIAGNOSIS — Z23 Encounter for immunization: Secondary | ICD-10-CM | POA: Diagnosis not present

## 2024-01-11 ENCOUNTER — Ambulatory Visit: Admitting: Physician Assistant

## 2024-01-13 ENCOUNTER — Other Ambulatory Visit: Payer: Self-pay | Admitting: Orthopedic Surgery

## 2024-01-13 DIAGNOSIS — E063 Autoimmune thyroiditis: Secondary | ICD-10-CM

## 2024-01-17 ENCOUNTER — Other Ambulatory Visit: Payer: Self-pay | Admitting: Internal Medicine

## 2024-01-17 DIAGNOSIS — E063 Autoimmune thyroiditis: Secondary | ICD-10-CM

## 2024-01-17 MED ORDER — LEVOTHYROXINE SODIUM 88 MCG PO TABS: 88.0000 ug | ORAL_TABLET | Freq: Every day | ORAL | 0 refills | Status: DC

## 2024-01-17 NOTE — Telephone Encounter (Signed)
 Copied from CRM #[id number]. Topic: Clinical - Medication Refill >> Jan 17, 2024  2:42 PM Alexandria E wrote: Medication: levothyroxine  (SYNTHROID ) 88 MCG tablet  Has the patient contacted their pharmacy? No (Agent: If no, request that the patient contact the pharmacy for the refill. If patient does not wish to contact the pharmacy document the reason why and proceed with request.) (Agent: If yes, when and what did the pharmacy advise?)  This is the patient's preferred pharmacy:  [facility] [address], [place] - [address] [place] [address] [address] Phone: [phone] Fax: [phone]   Is this the correct pharmacy for this prescription? Yes If no, delete pharmacy and type the correct one.   Has the prescription been filled recently? No  Is the patient out of the medication? Yes  Has the patient been seen for an appointment in the last year OR does the patient have an upcoming appointment? Yes  Can we respond through MyChart? Yes  Agent: Please be advised that Rx refills may take up to 3 business days. We ask that you follow-up with your pharmacy.

## 2024-02-05 ENCOUNTER — Ambulatory Visit (HOSPITAL_COMMUNITY): Admitting: Psychiatry

## 2024-02-05 ENCOUNTER — Encounter (HOSPITAL_COMMUNITY): Payer: Self-pay

## 2024-02-05 ENCOUNTER — Other Ambulatory Visit: Payer: Self-pay

## 2024-02-05 VITALS — BP 123/63 | HR 90 | Ht 64.0 in | Wt 149.0 lb

## 2024-02-05 DIAGNOSIS — F319 Bipolar disorder, unspecified: Secondary | ICD-10-CM

## 2024-02-05 MED ORDER — VENLAFAXINE HCL ER 75 MG PO CP24: ORAL_CAPSULE | ORAL | 3 refills | Status: AC

## 2024-02-05 MED ORDER — DIVALPROEX SODIUM ER 500 MG PO TB24: 500.0000 mg | ORAL_TABLET | Freq: Every day | ORAL | 6 refills | Status: AC

## 2024-02-05 NOTE — Progress Notes (Signed)
 Psychiatric Initial Adult Assessment   Patient Identification: Jackie Jones MRN:  [id number] Date of Evaluation:  02/05/2024 Referral Source: [facility] Chief Complaint: Depression Visit Diagnosis:     Today the patient is doing extremely well.  She says her mood is great.  She does have her 78th birthday and all her family came from all of the country to celebrate with her.  She denies any symptoms of depression.  Her son Fonda lives in [place]  and he is stable.  Patient loves playing trivial pursue.  She exercises.  She is sleeping and eating well has good energy no problems thinking or concentrating.  She enjoys watching TV.   ICD-10-CM   1. Bipolar 1 disorder, depressed (HCC)  F31.9 divalproex  (DEPAKOTE  ER) 500 MG 24 hr tablet      History of Present Illness:    Associated Signs/Symptoms: Depression Symptoms:   (Hypo) Manic Symptoms:   Anxiety Symptoms:   Psychotic Symptoms:   PTSD Symptoms: NA  Past Psychiatric History: 2 psychiatric hospitalizations.  Previous Psychotropic Medications: Yes   Substance Abuse History in the last 12 months:  No.  Consequences of Substance Abuse: NA  Past Medical History:  Past Medical History:  Diagnosis Date   Allergy    Anxiety 2000   Arthritis    Bipolar 1 disorder, depressed (HCC)    Bipolar disorder (HCC)    Chicken pox    Dementia (HCC)    Depression    Dysrhythmia 05/10/2020   pt states cardiologist told me it is not dangerous, it's an anomaly   Hashimoto's thyroiditis    History of blood transfusion    History of bone density study 2019   History of colonoscopy 2015   History of CT scan 2019   History of mammogram 2019   History of MRI    History of Papanicolaou smear of cervix    History of right hip replacement    Hx: UTI (urinary tract infection)    Hypertension    Hypothyroidism    Lactose intolerance    Legally blind in left eye, as defined in [place]     Non-celiac gluten sensitivity    Osteoporosis     Pernicious anemia    Pernicious anemia    Rheumatic fever    Thyroid  disease    Urinary and fecal incontinence     Past Surgical History:  Procedure Laterality Date   ABDOMINAL HYSTERECTOMY     1981   APPENDECTOMY  1981   EYE SURGERY Bilateral 2014   cataracts   LEFT HEART CATH AND CORONARY ANGIOGRAPHY N/A 05/10/2020   Procedure: LEFT HEART CATH AND CORONARY ANGIOGRAPHY;  Surgeon: Mady Bruckner, MD;  Location: [facility] INVASIVE CV LAB;  Service: Cardiovascular;  Laterality: N/A;   prolapsed rectum 2015     TONSILLECTOMY AND ADENOIDECTOMY  1966   TOTAL HIP ARTHROPLASTY Right 11/23/2020   Procedure: RIGHT TOTAL HIP ARTHROPLASTY ANTERIOR APPROACH;  Surgeon: Vernetta Bruckner GRADE, MD;  Location: [facility] OR;  Service: Orthopedics;  Laterality: Right;   TUBAL LIGATION      Family Psychiatric History:   Family History:  Family History  Problem Relation Age of Onset   Heart disease Mother    Arthritis Mother    Cancer Father        bladder   Thyroid  disease Father    Dementia Father    Hypertension Sister    Hypertension Brother    Hypertension Sister    Hypertension Sister    Diabetes Brother  Diabetes type II Son     Social History:   Social History   Socioeconomic History   Marital status: Single    Spouse name: Not on file   Number of children: 1   Years of education: Not on file   Highest education level: Master's degree (e.g., MA, MS, MEng, MEd, MSW, MBA)  Occupational History   Not on file  Tobacco Use   Smoking status: Never   Smokeless tobacco: Never  Vaping Use   Vaping status: Never Used  Substance and Sexual Activity   Alcohol  use: Never   Drug use: Never   Sexual activity: Not Currently  Other Topics Concern   Not on file  Social History Narrative   Tobacco use, amount per day now: None.   Past tobacco use, amount per day: None.   How many years did you use tobacco: None.   Alcohol  use (drinks per week): None.   Diet: I eat a low fat diet. Much in  vegetables.    Do you drink/eat things with caffeine: Yes.   Marital status:  Divorced                                What year were you married? 1976   Do you live in a house, apartment, assisted living, condo, trailer, etc.? Rent small house.   Is it one or more stories? One.   How many persons live in your home? I live alone.    Do you have pets in your home?( please list) No.   Highest Level of education completed? Masters Degree Plus.   Current or past profession: Runner, Broadcasting/film/video.   Do you exercise? Yes.                                  Type and how often? 5 days a week I walk.    Do you have a living will? Yes.   Do you have a DNR form?    Yes                               If not, do you want to discuss one?   Do you have signed POA/HPOA forms?  Yes.                      If so, please bring to you appointment      Do you have any difficulty bathing or dressing yourself? No.   Do you have any difficulty preparing food or eating? No.   Do you have any difficulty managing your medications? No.   Do you have any difficulty managing your finances? Yes.   Do you have any difficulty affording your medications? No.   Social Drivers of Health   Financial Resource Strain: Medium Risk (09/30/2023)   Overall Financial Resource Strain (CARDIA)    Difficulty of Paying Living Expenses: Somewhat hard  Food Insecurity: No Food Insecurity (09/30/2023)   Hunger Vital Sign    Worried About Running Out of Food in the Last Year: Never true    Ran Out of Food in the Last Year: Never true  Transportation Needs: No Transportation Needs (09/30/2023)   PRAPARE - Administrator, Civil Service (Medical): No    Lack of Transportation (Non-Medical): No  Physical Activity: Sufficiently Active (09/30/2023)   Exercise Vital Sign    Days of Exercise per Week: 5 days    Minutes of Exercise per Session: 60 min  Stress: No Stress Concern Present (09/30/2023)   [employer] of Occupational Health -  Occupational Stress Questionnaire    Feeling of Stress: Not at all  Social Connections: Moderately Integrated (09/30/2023)   Social Connection and Isolation Panel    Frequency of Communication with Friends and Family: More than three times a week    Frequency of Social Gatherings with Friends and Family: More than three times a week    Attends Religious Services: More than 4 times per year    Active Member of [employer] or Organizations: Yes    Attends Engineer, Structural: More than 4 times per year    Marital Status: Divorced    Additional Social History:   Allergies:   Allergies  Allergen Reactions   Contrast Media [Iodinated Contrast Media] Itching   Trintellix  [Vortioxetine ] Other (See Comments)    Led to mania   Dairycare [Bacid]    Doxycycline  Hyclate Nausea And Vomiting    Double vision   Soy Allergy (Obsolete) Itching   Ciprofloxacin Rash   Levaquin [Levofloxacin] Rash   Penicillins Rash    Metabolic Disorder Labs: No results found for: HGBA1C, MPG No results found for: PROLACTIN Lab Results  Component Value Date   CHOL 165 09/25/2018   TRIG 56.0 09/25/2018   HDL 74.50 09/25/2018   CHOLHDL 2 09/25/2018   VLDL 11.2 09/25/2018   LDLCALC 79 09/25/2018   Lab Results  Component Value Date   TSH 1.54 07/05/2023    Therapeutic Level Labs: No results found for: LITHIUM No results found for: CBMZ Lab Results  Component Value Date   VALPROATE 34.1 (L) 10/26/2022    Current Medications: Current Outpatient Medications  Medication Sig Dispense Refill   acetaminophen  (TYLENOL ) 500 MG tablet Take 1,000 mg by mouth at bedtime.     alendronate  (FOSAMAX ) 70 MG tablet TAKE 1 TABLET(70 MG) BY MOUTH 1 TIME A WEEK WITH A FULL GLASS OF WATER AND ON AN EMPTY STOMACH 12 tablet 3   amLODipine  (NORVASC ) 5 MG tablet Take 1 tablet (5 mg total) by mouth daily. 90 tablet 2   aspirin  EC 81 MG tablet Take 81 mg by mouth daily. Swallow whole.     Calcium -Vitamin  D-Vitamin K (VIACTIV CALCIUM  PLUS D) 650-12.5-40 MG-MCG-MCG CHEW Chew 2 tablets by mouth daily.     cetirizine (ZYRTEC) 10 MG tablet Take 10 mg by mouth daily.     cyanocobalamin  (VITAMIN B12) 1000 MCG/ML injection INJECT 1 ML INTRAMUSCULARLY  ONCE EVERY MONTH 3 mL 2   Emollient (AVEENO RESTORATIVE SKIN THERAP) CREA Apply 1 application topically daily.     halobetasol (ULTRAVATE) 0.05 % ointment Apply 1 application topically daily as needed (eczema).     ipratropium (ATROVENT ) 0.03 % nasal spray Place 2 sprays into both nostrils every 12 (twelve) hours. 30 mL 3   levothyroxine  (SYNTHROID ) 88 MCG tablet Take 1 tablet (88 mcg total) by mouth daily. 90 tablet 0   loratadine  (CLARITIN ) 10 MG tablet Take 10 mg by mouth daily.     meclizine  (ANTIVERT ) 50 MG tablet Take 1 tablet (50 mg total) by mouth every 6 (six) hours as needed. 30 tablet 0   Multiple Vitamins-Minerals (CENTRUM SILVER ADULT 50+ PO) Take 1 tablet by mouth daily.     Multiple Vitamins-Minerals (PRESERVISION AREDS) CAPS Take 1  capsule by mouth in the morning and at bedtime.     olmesartan  (BENICAR ) 20 MG tablet Take 1 tablet (20 mg total) by mouth daily. 90 tablet 1   psyllium (METAMUCIL) 58.6 % packet Take 1 packet by mouth daily.     SYRINGE-NEEDLE, DISP, 3 ML (BD SAFETYGLIDE SYRINGE/NEEDLE) 25G X 1 3 ML MISC Inject 1ml in deltoid once monthly 100 each 11   Wheat Dextrin (BENEFIBER PO) Take by mouth. Take one teaspoon daily     divalproex  (DEPAKOTE  ER) 500 MG 24 hr tablet Take 1 tablet (500 mg total) by mouth at bedtime. 90 tablet 6   venlafaxine  XR (EFFEXOR -XR) 75 MG 24 hr capsule 1 qam 90 capsule 3   No current facility-administered medications for this visit.    Musculoskeletal: Strength & Muscle Tone: within normal limits Gait & Station: unsteady Patient leans: N/A  Psychiatric Specialty Exam: Review of Systems  Blood pressure 123/63, pulse 90, height 5' 4 (1.626 m), weight 149 lb (67.6 kg).Body mass index is 25.58  kg/m.  General Appearance: Fairly Groomed  Eye Contact:  Good  Speech:  Normal Rate  Volume:  Normal  Mood:  NA  Affect:  Appropriate  Thought Process:  Goal Directed  Orientation:  Full (Time, Place, and Person)  Thought Content:  WDL  Suicidal Thoughts:  No  Homicidal Thoughts:  No  Memory:  NA  Judgement:  Good  Insight:  Good  Psychomotor Activity:  Normal  Concentration:    Recall:  Good  Fund of Knowledge:Good  Language: Good  Akathisia:  No  Handed:  Right  AIMS (if indicated):  not done  Assets:  Desire for Improvement  ADL's:  Intact  Cognition: WNL  Sleep:  Good   Screenings: Mini-Mental    Flowsheet Row Office Visit from 10/20/2021 in [facility] Adult [facility] Office Visit from 05/28/2020 in [facility] & Adult Medicine  Total Score (max 30 points ) 30 27   PHQ2-9    Flowsheet Row Office Visit from 05/31/2023 in [facility] & Adult Medicine Erroneous Encounter from 07/06/2022 in [facility] Office Visit from 06/30/2021 in [facility] Clinical Support from 10/07/2019 in [facility] [place] [facility] Office Visit from 10/30/2018 in [facility] [place]  PHQ-2 Total Score 0 0 0 0 0  PHQ-9 Total Score -- -- -- 0 0   Flowsheet Row ED from 11/30/2023 in [facility] Emergency Department at [facility] ED from 05/23/2023 in [facility] Emergency Department at [facility] Admission (Discharged) from 11/23/2020 in [facility]  [facility]  C-SSRS RISK CATEGORY No Risk No Risk No Risk    Assessment and Plan:    This patient's diagnosis is bipolar disorder.  She continues taking 75 mg of Effexor  but more importantly she takes Depakote  500 mg.  The patient will get a Depakote  blood level tomorrow morning when she gets fasting blood work at her primary care doctor's  office.  She also get a comprehensive metabolic panel.  Patient return to see me in about 4 months.  The patient is very stable.  Collaboration of Care:   Patient/Guardian was advised Release of Information must be obtained prior to any record release in order to collaborate their care with an outside provider. Patient/Guardian was advised if they have not already done so to  contact the registration department to sign all necessary forms in order for us  to release information regarding their care.   Consent: Patient/Guardian gives verbal consent for treatment and assignment of benefits for services provided during this visit. Patient/Guardian expressed understanding and agreed to proceed.   Elna LILLETTE Lo, MD 11/18/20252:41 PM

## 2024-02-06 ENCOUNTER — Ambulatory Visit: Admitting: Internal Medicine

## 2024-02-06 ENCOUNTER — Encounter: Payer: Self-pay | Admitting: Internal Medicine

## 2024-02-06 VITALS — BP 112/70 | HR 78 | Temp 98.3°F | Ht 63.0 in | Wt 146.1 lb

## 2024-02-06 DIAGNOSIS — I10 Essential (primary) hypertension: Secondary | ICD-10-CM

## 2024-02-06 DIAGNOSIS — M810 Age-related osteoporosis without current pathological fracture: Secondary | ICD-10-CM

## 2024-02-06 DIAGNOSIS — D510 Vitamin B12 deficiency anemia due to intrinsic factor deficiency: Secondary | ICD-10-CM

## 2024-02-06 DIAGNOSIS — Z0000 Encounter for general adult medical examination without abnormal findings: Secondary | ICD-10-CM | POA: Diagnosis not present

## 2024-02-06 DIAGNOSIS — I2510 Atherosclerotic heart disease of native coronary artery without angina pectoris: Secondary | ICD-10-CM | POA: Diagnosis not present

## 2024-02-06 DIAGNOSIS — E063 Autoimmune thyroiditis: Secondary | ICD-10-CM | POA: Diagnosis not present

## 2024-02-06 DIAGNOSIS — F319 Bipolar disorder, unspecified: Secondary | ICD-10-CM

## 2024-02-06 LAB — CBC WITH DIFFERENTIAL/PLATELET
Basophils Absolute: 0.1 K/uL (ref 0.0–0.1)
Basophils Relative: 1 % (ref 0.0–3.0)
Eosinophils Absolute: 0.2 K/uL (ref 0.0–0.7)
Eosinophils Relative: 2.4 % (ref 0.0–5.0)
HCT: 35.2 % — ABNORMAL LOW (ref 36.0–46.0)
Hemoglobin: 12 g/dL (ref 12.0–15.0)
Lymphocytes Relative: 29.7 % (ref 12.0–46.0)
Lymphs Abs: 2.2 K/uL (ref 0.7–4.0)
MCHC: 34.1 g/dL (ref 30.0–36.0)
MCV: 87.9 fl (ref 78.0–100.0)
Monocytes Absolute: 0.5 K/uL (ref 0.1–1.0)
Monocytes Relative: 7 % (ref 3.0–12.0)
Neutro Abs: 4.5 K/uL (ref 1.4–7.7)
Neutrophils Relative %: 59.9 % (ref 43.0–77.0)
Platelets: 357 K/uL (ref 150.0–400.0)
RBC: 4 Mil/uL (ref 3.87–5.11)
RDW: 13.1 % (ref 11.5–15.5)
WBC: 7.4 K/uL (ref 4.0–10.5)

## 2024-02-06 LAB — COMPREHENSIVE METABOLIC PANEL WITH GFR
ALT: 11 U/L (ref 0–35)
AST: 19 U/L (ref 0–37)
Albumin: 4.4 g/dL (ref 3.5–5.2)
Alkaline Phosphatase: 56 U/L (ref 39–117)
BUN: 21 mg/dL (ref 6–23)
CO2: 28 meq/L (ref 19–32)
Calcium: 9.7 mg/dL (ref 8.4–10.5)
Chloride: 92 meq/L — ABNORMAL LOW (ref 96–112)
Creatinine, Ser: 0.74 mg/dL (ref 0.40–1.20)
GFR: 76.28 mL/min (ref >60.00)
Glucose, Bld: 90 mg/dL (ref 70–99)
Potassium: 5.5 meq/L — ABNORMAL HIGH (ref 3.5–5.1)
Sodium: 129 meq/L — ABNORMAL LOW (ref 135–145)
Total Bilirubin: 0.4 mg/dL (ref 0.2–1.2)
Total Protein: 7.4 g/dL (ref 6.0–8.3)

## 2024-02-06 LAB — VITAMIN B12: Vitamin B-12: 537 pg/mL (ref 211–911)

## 2024-02-06 LAB — LIPID PANEL
Cholesterol: 201 mg/dL — ABNORMAL HIGH (ref 0–200)
HDL: 70.2 mg/dL (ref >39.00)
LDL Cholesterol: 117 mg/dL — ABNORMAL HIGH (ref 0–99)
NonHDL: 130.77
Total CHOL/HDL Ratio: 3
Triglycerides: 70 mg/dL (ref 0.0–149.0)
VLDL: 14 mg/dL (ref 0.0–40.0)

## 2024-02-06 LAB — VITAMIN D 25 HYDROXY (VIT D DEFICIENCY, FRACTURES): VITD: 58.07 ng/mL (ref 30.00–100.00)

## 2024-02-06 LAB — TSH: TSH: 1.64 u[IU]/mL (ref 0.35–5.50)

## 2024-02-06 NOTE — Progress Notes (Signed)
 Established Patient Office Visit     CC/Reason for Visit: Subsequent Medicare wellness visit and yearly follow-up chronic medical conditions  HPI: Jackie Jones is a 78 y.o. female who is coming in today for the above mentioned reasons. Past Medical History is significant for: Hypothyroidism, bipolar disorder followed by psychiatry, hypertension and osteoporosis.  She is feeling well.  She has routine eye and dental care.  All immunizations are up-to-date.  She is due for a bone density.  She elects to not pursue further cancer screening due to her age.  Her psychiatrist is requesting a Depakote  level.   Past Medical/Surgical History: Past Medical History:  Diagnosis Date   Allergy    Anxiety 2000   Arthritis    Bipolar 1 disorder, depressed (HCC)    Bipolar disorder (HCC)    Chicken pox    Dementia (HCC)    Depression    Dysrhythmia 05/10/2020   pt states cardiologist told me it is not dangerous, it's an anomaly   Hashimoto's thyroiditis    History of blood transfusion    History of bone density study 2019   History of colonoscopy 2015   History of CT scan 2019   History of mammogram 2019   History of MRI    History of Papanicolaou smear of cervix    History of right hip replacement    Hx: UTI (urinary tract infection)    Hypertension    Hypothyroidism    Lactose intolerance    Legally blind in left eye, as defined in [place]     Non-celiac gluten sensitivity    Osteoporosis    Pernicious anemia    Pernicious anemia    Rheumatic fever    Thyroid  disease    Urinary and fecal incontinence     Past Surgical History:  Procedure Laterality Date   ABDOMINAL HYSTERECTOMY     1981   APPENDECTOMY  1981   EYE SURGERY Bilateral 2014   cataracts   LEFT HEART CATH AND CORONARY ANGIOGRAPHY N/A 05/10/2020   Procedure: LEFT HEART CATH AND CORONARY ANGIOGRAPHY;  Surgeon: Mady Bruckner, MD;  Location: [facility];  Service: Cardiovascular;  Laterality: N/A;    prolapsed rectum 2015     TONSILLECTOMY AND ADENOIDECTOMY  1966   TOTAL HIP ARTHROPLASTY Right 11/23/2020   Procedure: RIGHT TOTAL HIP ARTHROPLASTY ANTERIOR APPROACH;  Surgeon: Vernetta Bruckner GRADE, MD;  Location: [facility] OR;  Service: Orthopedics;  Laterality: Right;   TUBAL LIGATION      Social History:  reports that she has never smoked. She has never used smokeless tobacco. She reports that she does not drink alcohol  and does not use drugs.  Allergies: Allergies  Allergen Reactions   Contrast Media [Iodinated Contrast Media] Itching   Trintellix  [Vortioxetine ] Other (See Comments)    Led to mania   Dairycare [Bacid]    Doxycycline  Hyclate Nausea And Vomiting    Double vision   Soy Allergy (Obsolete) Itching   Ciprofloxacin Rash   Levaquin [Levofloxacin] Rash   Penicillins Rash    Family History:  Family History  Problem Relation Age of Onset   Heart disease Mother    Arthritis Mother    Cancer Father        bladder   Thyroid  disease Father    Dementia Father    Hypertension Sister    Hypertension Brother    Hypertension Sister    Hypertension Sister    Diabetes Brother    Diabetes type  II Son      Current Outpatient Medications:    acetaminophen  (TYLENOL ) 500 MG tablet, Take 1,000 mg by mouth at bedtime., Disp: , Rfl:    alendronate  (FOSAMAX ) 70 MG tablet, TAKE 1 TABLET(70 MG) BY MOUTH 1 TIME A WEEK WITH A FULL GLASS OF WATER AND ON AN EMPTY STOMACH, Disp: 12 tablet, Rfl: 3   amLODipine  (NORVASC ) 5 MG tablet, Take 1 tablet (5 mg total) by mouth daily., Disp: 90 tablet, Rfl: 2   aspirin  EC 81 MG tablet, Take 81 mg by mouth daily. Swallow whole., Disp: , Rfl:    Calcium -Vitamin D -Vitamin K (VIACTIV CALCIUM  PLUS D) 650-12.5-40 MG-MCG-MCG CHEW, Chew 2 tablets by mouth daily., Disp: , Rfl:    cetirizine (ZYRTEC) 10 MG tablet, Take 10 mg by mouth daily., Disp: , Rfl:    cyanocobalamin  (VITAMIN B12) 1000 MCG/ML injection, INJECT 1 ML INTRAMUSCULARLY  ONCE EVERY MONTH, Disp:  3 mL, Rfl: 2   divalproex  (DEPAKOTE  ER) 500 MG 24 hr tablet, Take 1 tablet (500 mg total) by mouth at bedtime., Disp: 90 tablet, Rfl: 6   Emollient (AVEENO RESTORATIVE SKIN THERAP) CREA, Apply 1 application topically daily., Disp: , Rfl:    halobetasol (ULTRAVATE) 0.05 % ointment, Apply 1 application topically daily as needed (eczema)., Disp: , Rfl:    ipratropium (ATROVENT ) 0.03 % nasal spray, Place 2 sprays into both nostrils every 12 (twelve) hours., Disp: 30 mL, Rfl: 3   levothyroxine  (SYNTHROID ) 88 MCG tablet, Take 1 tablet (88 mcg total) by mouth daily., Disp: 90 tablet, Rfl: 0   loratadine  (CLARITIN ) 10 MG tablet, Take 10 mg by mouth daily., Disp: , Rfl:    meclizine  (ANTIVERT ) 50 MG tablet, Take 1 tablet (50 mg total) by mouth every 6 (six) hours as needed., Disp: 30 tablet, Rfl: 0   Multiple Vitamins-Minerals (CENTRUM SILVER ADULT 50+ PO), Take 1 tablet by mouth daily., Disp: , Rfl:    Multiple Vitamins-Minerals (PRESERVISION AREDS) CAPS, Take 1 capsule by mouth in the morning and at bedtime., Disp: , Rfl:    olmesartan  (BENICAR ) 20 MG tablet, Take 1 tablet (20 mg total) by mouth daily., Disp: 90 tablet, Rfl: 1   psyllium (METAMUCIL) 58.6 % packet, Take 1 packet by mouth daily., Disp: , Rfl:    SYRINGE-NEEDLE, DISP, 3 ML (BD SAFETYGLIDE SYRINGE/NEEDLE) 25G X 1 3 ML MISC, Inject 1ml in deltoid once monthly, Disp: 100 each, Rfl: 11   venlafaxine  XR (EFFEXOR -XR) 75 MG 24 hr capsule, 1 qam, Disp: 90 capsule, Rfl: 3   Wheat Dextrin (BENEFIBER PO), Take by mouth. Take one teaspoon daily, Disp: , Rfl:   Review of Systems:  Negative unless indicated in HPI.   Physical Exam: Vitals:   02/06/24 0842  BP: 112/70  Pulse: 78  Temp: 98.3 F (36.8 C)  TempSrc: Oral  SpO2: 97%  Weight: 146 lb 1.6 oz (66.3 kg)  Height: 5' 3 (1.6 m)    Body mass index is 25.88 kg/m.   Physical Exam Vitals reviewed.  Constitutional:      General: She is not in acute distress.    Appearance: Normal  appearance. She is not ill-appearing, toxic-appearing or diaphoretic.  HENT:     Head: Normocephalic.     Right Ear: Tympanic membrane, ear canal and external ear normal. There is no impacted cerumen.     Left Ear: Tympanic membrane, ear canal and external ear normal. There is no impacted cerumen.     Nose: Nose normal.  Mouth/Throat:     Mouth: Mucous membranes are moist.     Pharynx: Oropharynx is clear. No oropharyngeal exudate or posterior oropharyngeal erythema.  Eyes:     General: No scleral icterus.       Right eye: No discharge.        Left eye: No discharge.     Conjunctiva/sclera: Conjunctivae normal.  Neck:     Vascular: No carotid bruit.  Cardiovascular:     Rate and Rhythm: Normal rate and regular rhythm.     Pulses: Normal pulses.     Heart sounds: Normal heart sounds.  Pulmonary:     Effort: Pulmonary effort is normal. No respiratory distress.     Breath sounds: Normal breath sounds.  Abdominal:     General: Abdomen is flat. Bowel sounds are normal.     Palpations: Abdomen is soft.  Musculoskeletal:        General: Normal range of motion.     Cervical back: Normal range of motion.  Skin:    General: Skin is warm and dry.  Neurological:     General: No focal deficit present.     Mental Status: She is alert and oriented to person, place, and time. Mental status is at baseline.  Psychiatric:        Mood and Affect: Mood normal.        Behavior: Behavior normal.        Thought Content: Thought content normal.        Judgment: Judgment normal.    Subsequent Medicare wellness visit   Visit info / Clinical Intake: Medicare Wellness Visit Type:: Welcome to Medicare (IPPE) Persons participating in visit:: patient Information given by:: patient Interpreter Needed?: No Pre-visit prep was completed: yes AWV questionnaire completed by patient prior to visit?: no Living arrangements:: in retirement community Patient's Overall Health Status Rating: good Typical  amount of pain: none Does pain affect daily life?: no Are you currently prescribed opioids?: no  Dietary Habits and Nutritional Risks How many meals a day?: 3 Eats fruit and vegetables daily?: yes Most meals are obtained by: preparing own meals; having others provide food In the last 2 weeks, have you had any of the following?: none Diabetic:: no  Functional Status Activities of Daily Living (to include ambulation/medication): (!) Needs Assist Feeding: Independent Dressing/Grooming: Independent Bathing: Independent Toileting: Independent Transfer: Independent Ambulation: Independent with device- listed below Home Assistive Devices/Equipment: Johna Finder (specify Type) Medication Administration: Independent Home Management: Independent Manage your own finances?: (!) no (sister) Primary transportation is: facility / other Concerns about vision?: no *vision screening is required for WTM* Concerns about hearing?: no  Fall Screening Falls in the past year?: 1 Number of falls in past year: 0 Was there an injury with Fall?: 1 Fall Risk Category Calculator: 2 Patient Fall Risk Level: Moderate Fall Risk  Fall Risk Patient at Risk for Falls Due to: Impaired balance/gait; Impaired mobility Fall risk Follow up: Falls evaluation completed  Home and Transportation Safety: All rugs have non-skid backing?: yes All stairs or steps have railings?: yes Grab bars in the bathtub or shower?: yes Have non-skid surface in bathtub or shower?: yes Good home lighting?: yes Regular seat belt use?: yes Hospital stays in the last year:: no  Cognitive Assessment Difficulty concentrating, remembering, or making decisions? : no Will 6CIT or Mini Cog be Completed: yes What year is it?: 0 points What month is it?: 0 points Give patient an address phrase to remember (5 components): the cow  jumped over the moon About what time is it?: 0 points Count backwards from 20 to 1: 0 points Say the months  of the year in reverse: 0 points Repeat the address phrase from earlier: 0 points 6 CIT Score: 0 points  Advance Directives (For Healthcare) Does Patient Have a Medical Advance Directive?: Yes Does patient want to make changes to medical advance directive?: No - Patient declined Type of Advance Directive: Healthcare Power of [place]; Living will Copy of Healthcare Power of Attorney in Chart?: No - copy requested Copy of Living Will in Chart?: No - copy requested Out of facility DNR (pink MOST or yellow form) in Chart? (Ambulatory ONLY): Yes - validated most recent copy scanned in chart  Reviewed/Updated  Reviewed/Updated: Reviewed All (Medical, Surgical, Family, Medications, Allergies, Care Teams, Patient Goals); Allergies; Care Teams; Patient Goals    Vision Screening   Right eye Left eye Both eyes  Without correction     With correction 20/40 20/40 20/40       Depression/mood:  Flowsheet Row Clinical Support from 10/07/2019 in [facility]  PHQ-9 Total Score 0        Counseling: Counseling given: Not Answered     Lab orders based on risk factors: Laboratory update will be reviewed     Screening: Patient provided with a written and personalized 5-10 year screening schedule in the AVS. Health Maintenance  Topic Date Due   COVID-19 Vaccine (13 - Pfizer risk 2025-26 season) 06/06/2024   Medicare Annual Wellness Visit  02/05/2025   DTaP/Tdap/Td vaccine (3 - Td or Tdap) 11/10/2028   Pneumococcal Vaccine for age over 7  Completed   Flu Shot  Completed   DEXA scan (bone density measurement)  Completed   Zoster (Shingles) Vaccine  Completed   Meningitis B Vaccine  Aged Out   Hepatitis C Screening  Discontinued     Provider List Update: Patient Care Team    Relationship Specialty Notifications Start End  Theophilus Andrews, Tully GRADE, MD PCP - General Internal Medicine  10/04/23        I have personally reviewed and noted the following in  the patient's chart:   Medical and social history Use of alcohol , tobacco or illicit drugs  Current medications and supplements Functional ability and status Nutritional status Physical activity Advanced directives List of other physicians Hospitalizations, surgeries, and ER visits in previous 12 months Vitals Screenings to include cognitive, depression, and falls Referrals and appointments  In addition, I have reviewed and discussed with patient certain preventive protocols, quality metrics, and best practice recommendations. A written personalized care plan for preventive services as well as general preventive health recommendations were provided to patient.   Impression and Plan:  Medicare annual wellness visit, subsequent  Primary hypertension -     CBC with Differential/Platelet; Future -     Comprehensive metabolic panel with GFR; Future -     Lipid panel; Future  Hypothyroidism due to Hashimoto thyroiditis -     TSH; Future  Pernicious anemia -     Vitamin B12; Future  Coronary artery disease involving native coronary artery of native heart without angina pectoris  Age-related osteoporosis without current pathological fracture -     VITAMIN D  25 Hydroxy (Vit-D Deficiency, Fractures); Future -     DG Bone Density; Future  Bipolar 1 disorder, depressed (HCC) -     Valproic acid  level; Future   -Recommend routine eye and dental care. -Healthy lifestyle discussed in detail. -  Labs to be updated today. -Prostate cancer screening: N/A Health Maintenance  Topic Date Due   COVID-19 Vaccine (13 - Pfizer risk 2025-26 season) 06/06/2024   Medicare Annual Wellness Visit  02/05/2025   DTaP/Tdap/Td vaccine (3 - Td or Tdap) 11/10/2028   Pneumococcal Vaccine for age over 79  Completed   Flu Shot  Completed   DEXA scan (bone density measurement)  Completed   Zoster (Shingles) Vaccine  Completed   Meningitis B Vaccine  Aged Out   Hepatitis C Screening  Discontinued      - Immunizations are up-to-date, DEXA requested.  Will order Depakote  level as requested by psychiatrist. - Check TSH level. - Blood pressure is well-controlled.    Tully Theophilus Andrews, MD [facility]

## 2024-02-07 ENCOUNTER — Ambulatory Visit: Payer: Self-pay | Admitting: Internal Medicine

## 2024-02-07 DIAGNOSIS — E875 Hyperkalemia: Secondary | ICD-10-CM

## 2024-02-07 LAB — VALPROIC ACID LEVEL: Valproic Acid Lvl: 31.8 mg/L — ABNORMAL LOW (ref 50.0–100.0)

## 2024-02-12 ENCOUNTER — Telehealth (HOSPITAL_COMMUNITY): Payer: Self-pay | Admitting: *Deleted

## 2024-02-12 NOTE — Telephone Encounter (Signed)
 Writer LVM for pt regarding waiting for Dr. Tasia return next week to change Depakote  dose.

## 2024-02-19 ENCOUNTER — Telehealth (HOSPITAL_COMMUNITY): Payer: Self-pay | Admitting: Psychiatry

## 2024-02-19 NOTE — Telephone Encounter (Signed)
 02/19/24 3:10pm Called the patient due to office is closing early on 12/10 the pt stats that the only reason she made the appt is to talk with Dr. Tasia about her labe work and medication - gave msg to provider he is going to call her. Dr. Tasia is going to call the patient./sh

## 2024-02-21 ENCOUNTER — Other Ambulatory Visit (INDEPENDENT_AMBULATORY_CARE_PROVIDER_SITE_OTHER)

## 2024-02-21 ENCOUNTER — Ambulatory Visit: Payer: Self-pay | Admitting: Internal Medicine

## 2024-02-21 DIAGNOSIS — E875 Hyperkalemia: Secondary | ICD-10-CM | POA: Diagnosis not present

## 2024-02-21 LAB — POTASSIUM: Potassium: 4.3 meq/L (ref 3.5–5.1)

## 2024-02-27 ENCOUNTER — Ambulatory Visit (HOSPITAL_COMMUNITY): Admitting: Psychiatry

## 2024-04-08 ENCOUNTER — Encounter: Payer: Self-pay | Admitting: Internal Medicine

## 2024-04-08 DIAGNOSIS — R42 Dizziness and giddiness: Secondary | ICD-10-CM

## 2024-04-10 ENCOUNTER — Other Ambulatory Visit: Payer: Self-pay | Admitting: Internal Medicine

## 2024-04-10 ENCOUNTER — Encounter: Payer: Self-pay | Admitting: Internal Medicine

## 2024-04-10 ENCOUNTER — Ambulatory Visit (INDEPENDENT_AMBULATORY_CARE_PROVIDER_SITE_OTHER): Admitting: Internal Medicine

## 2024-04-10 VITALS — BP 108/68 | HR 94 | Temp 98.5°F | Wt 148.1 lb

## 2024-04-10 DIAGNOSIS — R42 Dizziness and giddiness: Secondary | ICD-10-CM | POA: Diagnosis not present

## 2024-04-10 DIAGNOSIS — E063 Autoimmune thyroiditis: Secondary | ICD-10-CM

## 2024-04-10 NOTE — Progress Notes (Signed)
 "    Established Patient Office Visit     CC/Reason for Visit: Dizziness  HPI: Jackie Jones is a 79 y.o. female who is coming in today for the above mentioned reasons.  For the past month or so has been experiencing significant dizziness when standing up.  It does not appear to be vertigo.  She has had syncopal episodes in the past.  She feels like after she drinks water with electrolytes the dizziness improves.  Her blood pressure in office today is 108/68.   Past Medical/Surgical History: Past Medical History:  Diagnosis Date   Allergy    Anxiety 2000   Arthritis    Bipolar 1 disorder, depressed (HCC)    Bipolar disorder (HCC)    Chicken pox    Dementia (HCC)    Depression    Dysrhythmia 05/10/2020   pt states cardiologist told me it is not dangerous, it's an anomaly   Hashimoto's thyroiditis    History of blood transfusion    History of bone density study 2019   History of colonoscopy 2015   History of CT scan 2019   History of mammogram 2019   History of MRI    History of Papanicolaou smear of cervix    History of right hip replacement    Hx: UTI (urinary tract infection)    Hypertension    Hypothyroidism    Lactose intolerance    Legally blind in left eye, as defined in [place]     Non-celiac gluten sensitivity    Osteoporosis    Pernicious anemia    Pernicious anemia    Rheumatic fever    Thyroid  disease    Urinary and fecal incontinence     Past Surgical History:  Procedure Laterality Date   ABDOMINAL HYSTERECTOMY     1981   APPENDECTOMY  1981   EYE SURGERY Bilateral 2014   cataracts   LEFT HEART CATH AND CORONARY ANGIOGRAPHY N/A 05/10/2020   Procedure: LEFT HEART CATH AND CORONARY ANGIOGRAPHY;  Surgeon: Mady Bruckner, MD;  Location: [facility];  Service: Cardiovascular;  Laterality: N/A;   prolapsed rectum 2015     TONSILLECTOMY AND ADENOIDECTOMY  1966   TOTAL HIP ARTHROPLASTY Right 11/23/2020   Procedure: RIGHT TOTAL HIP ARTHROPLASTY  ANTERIOR APPROACH;  Surgeon: Vernetta Bruckner GRADE, MD;  Location: [facility] OR;  Service: Orthopedics;  Laterality: Right;   TUBAL LIGATION      Social History:  reports that she has never smoked. She has never used smokeless tobacco. She reports that she does not drink alcohol  and does not use drugs.  Allergies: Allergies[1]  Family History:  Family History  Problem Relation Age of Onset   Heart disease Mother    Arthritis Mother    Cancer Father        bladder   Thyroid  disease Father    Dementia Father    Hypertension Sister    Hypertension Brother    Hypertension Sister    Hypertension Sister    Diabetes Brother    Diabetes type II Son     Current Medications[2]  Review of Systems:  Negative unless indicated in HPI.   Physical Exam: Vitals:   04/10/24 1432  BP: 108/68  Pulse: 94  Temp: 98.5 F (36.9 C)  TempSrc: Oral  SpO2: 98%  Weight: 148 lb 1.6 oz (67.2 kg)    Body mass index is 26.23 kg/m.     Impression and Plan:  Dizziness  -Likely orthostatic in nature.  I  will discontinue olmesartan  for now.  She will do blood pressure measurements and return in 2 to 3 months for follow-up.   Time spent:31 minutes reviewing chart, interviewing and examining patient and formulating plan of care.     Tully Theophilus Andrews, MD [facility]     [1]  Allergies Allergen Reactions   Contrast Media [Iodinated Contrast Media] Itching   Trintellix  [Vortioxetine ] Other (See Comments)    Led to mania   Dairycare [Bacid]    Doxycycline  Hyclate Nausea And Vomiting    Double vision   Soy Allergy (Obsolete) Itching   Ciprofloxacin Rash   Levaquin [Levofloxacin] Rash   Penicillins Rash  [2]  Current Outpatient Medications:    acetaminophen  (TYLENOL ) 500 MG tablet, Take 1,000 mg by mouth at bedtime., Disp: , Rfl:    alendronate  (FOSAMAX ) 70 MG tablet, TAKE 1 TABLET(70 MG) BY MOUTH 1 TIME A WEEK WITH A FULL GLASS OF WATER AND ON AN EMPTY  STOMACH, Disp: 12 tablet, Rfl: 3   amLODipine  (NORVASC ) 5 MG tablet, Take 1 tablet (5 mg total) by mouth daily., Disp: 90 tablet, Rfl: 2   aspirin  EC 81 MG tablet, Take 81 mg by mouth daily. Swallow whole., Disp: , Rfl:    Calcium -Vitamin D -Vitamin K (VIACTIV CALCIUM  PLUS D) 650-12.5-40 MG-MCG-MCG CHEW, Chew 2 tablets by mouth daily., Disp: , Rfl:    cetirizine (ZYRTEC) 10 MG tablet, Take 10 mg by mouth daily., Disp: , Rfl:    cyanocobalamin  (VITAMIN B12) 1000 MCG/ML injection, INJECT 1 ML INTRAMUSCULARLY  ONCE EVERY MONTH, Disp: 3 mL, Rfl: 2   divalproex  (DEPAKOTE  ER) 500 MG 24 hr tablet, Take 1 tablet (500 mg total) by mouth at bedtime., Disp: 90 tablet, Rfl: 6   Emollient (AVEENO RESTORATIVE SKIN THERAP) CREA, Apply 1 application topically daily., Disp: , Rfl:    halobetasol (ULTRAVATE) 0.05 % ointment, Apply 1 application topically daily as needed (eczema)., Disp: , Rfl:    ipratropium (ATROVENT ) 0.03 % nasal spray, Place 2 sprays into both nostrils every 12 (twelve) hours., Disp: 30 mL, Rfl: 3   levothyroxine  (SYNTHROID ) 88 MCG tablet, Take 1 tablet by mouth once daily, Disp: 90 tablet, Rfl: 0   loratadine  (CLARITIN ) 10 MG tablet, Take 10 mg by mouth daily., Disp: , Rfl:    meclizine  (ANTIVERT ) 50 MG tablet, Take 1 tablet (50 mg total) by mouth every 6 (six) hours as needed., Disp: 30 tablet, Rfl: 0   Multiple Vitamins-Minerals (CENTRUM SILVER ADULT 50+ PO), Take 1 tablet by mouth daily., Disp: , Rfl:    Multiple Vitamins-Minerals (PRESERVISION AREDS) CAPS, Take 1 capsule by mouth in the morning and at bedtime., Disp: , Rfl:    psyllium (METAMUCIL) 58.6 % packet, Take 1 packet by mouth daily., Disp: , Rfl:    SYRINGE-NEEDLE, DISP, 3 ML (BD SAFETYGLIDE SYRINGE/NEEDLE) 25G X 1 3 ML MISC, Inject 1ml in deltoid once monthly, Disp: 100 each, Rfl: 11   venlafaxine  XR (EFFEXOR -XR) 75 MG 24 hr capsule, 1 qam, Disp: 90 capsule, Rfl: 3   Wheat Dextrin (BENEFIBER PO), Take by mouth. Take one teaspoon  daily, Disp: , Rfl:   "

## 2024-05-20 ENCOUNTER — Ambulatory Visit (HOSPITAL_COMMUNITY): Admitting: Psychiatry

## 2024-06-09 ENCOUNTER — Ambulatory Visit: Admitting: Internal Medicine
